# Patient Record
Sex: Female | Born: 1955 | Race: White | Hispanic: No | Smoking: Never smoker
Health system: Southern US, Community
[De-identification: ages and names within clinical notes are randomized; demographics above are authoritative.]

## PROBLEM LIST (undated history)

## (undated) DIAGNOSIS — R0789 Other chest pain: Secondary | ICD-10-CM

## (undated) DIAGNOSIS — K219 Gastro-esophageal reflux disease without esophagitis: Secondary | ICD-10-CM

## (undated) DIAGNOSIS — J189 Pneumonia, unspecified organism: Secondary | ICD-10-CM

## (undated) DIAGNOSIS — S32009K Unspecified fracture of unspecified lumbar vertebra, subsequent encounter for fracture with nonunion: Secondary | ICD-10-CM

## (undated) DIAGNOSIS — G894 Chronic pain syndrome: Secondary | ICD-10-CM

## (undated) DIAGNOSIS — I341 Nonrheumatic mitral (valve) prolapse: Secondary | ICD-10-CM

## (undated) DIAGNOSIS — F32A Depression, unspecified: Secondary | ICD-10-CM

## (undated) DIAGNOSIS — G459 Transient cerebral ischemic attack, unspecified: Secondary | ICD-10-CM

## (undated) DIAGNOSIS — R0602 Shortness of breath: Secondary | ICD-10-CM

## (undated) DIAGNOSIS — I639 Cerebral infarction, unspecified: Secondary | ICD-10-CM

## (undated) DIAGNOSIS — R519 Headache, unspecified: Secondary | ICD-10-CM

## (undated) DIAGNOSIS — R569 Unspecified convulsions: Secondary | ICD-10-CM

## (undated) DIAGNOSIS — S2249XA Multiple fractures of ribs, unspecified side, initial encounter for closed fracture: Secondary | ICD-10-CM

## (undated) DIAGNOSIS — S2239XA Fracture of one rib, unspecified side, initial encounter for closed fracture: Secondary | ICD-10-CM

## (undated) DIAGNOSIS — M5126 Other intervertebral disc displacement, lumbar region: Secondary | ICD-10-CM

## (undated) DIAGNOSIS — F419 Anxiety disorder, unspecified: Secondary | ICD-10-CM

## (undated) DIAGNOSIS — I82409 Acute embolism and thrombosis of unspecified deep veins of unspecified lower extremity: Secondary | ICD-10-CM

## (undated) DIAGNOSIS — E039 Hypothyroidism, unspecified: Secondary | ICD-10-CM

## (undated) DIAGNOSIS — M1990 Unspecified osteoarthritis, unspecified site: Secondary | ICD-10-CM

## (undated) DIAGNOSIS — F329 Major depressive disorder, single episode, unspecified: Secondary | ICD-10-CM

## (undated) DIAGNOSIS — R51 Headache: Secondary | ICD-10-CM

## (undated) HISTORY — PX: ABDOMINAL HYSTERECTOMY: SUR658

## (undated) HISTORY — PX: OTHER SURGICAL HISTORY: SHX169

## (undated) HISTORY — PX: APPENDECTOMY: SHX54

## (undated) HISTORY — DX: Major depressive disorder, single episode, unspecified: F32.9

## (undated) HISTORY — DX: Multiple fractures of ribs, unspecified side, initial encounter for closed fracture: S22.49XA

## (undated) HISTORY — PX: ELBOW SURGERY: SHX618

## (undated) HISTORY — DX: Anxiety disorder, unspecified: F41.9

## (undated) HISTORY — DX: Depression, unspecified: F32.A

## (undated) HISTORY — DX: Nonrheumatic mitral (valve) prolapse: I34.1

## (undated) HISTORY — DX: Fracture of one rib, unspecified side, initial encounter for closed fracture: S22.39XA

## (undated) HISTORY — PX: KNEE SURGERY: SHX244

## (undated) HISTORY — PX: SHOULDER SURGERY: SHX246

## (undated) HISTORY — PX: OOPHORECTOMY: SHX86

## (undated) HISTORY — DX: Acute embolism and thrombosis of unspecified deep veins of unspecified lower extremity: I82.409

## (undated) HISTORY — PX: BREAST BIOPSY: SHX20

## (undated) HISTORY — PX: CERVICAL SPINE SURGERY: SHX589

## (undated) HISTORY — PX: TONSILLECTOMY: SUR1361

## (undated) HISTORY — PX: CHOLECYSTECTOMY: SHX55

---

## 1997-09-04 ENCOUNTER — Inpatient Hospital Stay (HOSPITAL_COMMUNITY): Admission: AD | Admit: 1997-09-04 | Discharge: 1997-09-04 | Payer: Self-pay | Admitting: Pediatrics

## 1997-10-11 ENCOUNTER — Encounter: Admission: RE | Admit: 1997-10-11 | Discharge: 1998-01-09 | Payer: Self-pay | Admitting: Anesthesiology

## 1997-11-04 ENCOUNTER — Inpatient Hospital Stay (HOSPITAL_COMMUNITY): Admission: EM | Admit: 1997-11-04 | Discharge: 1997-11-11 | Payer: Self-pay | Admitting: Emergency Medicine

## 1997-12-02 ENCOUNTER — Emergency Department (HOSPITAL_COMMUNITY): Admission: EM | Admit: 1997-12-02 | Discharge: 1997-12-02 | Payer: Self-pay | Admitting: Emergency Medicine

## 1998-01-14 ENCOUNTER — Inpatient Hospital Stay (HOSPITAL_COMMUNITY): Admission: RE | Admit: 1998-01-14 | Discharge: 1998-01-20 | Payer: Self-pay | Admitting: Specialist

## 1998-01-22 ENCOUNTER — Encounter (HOSPITAL_COMMUNITY): Admission: RE | Admit: 1998-01-22 | Discharge: 1998-04-22 | Payer: Self-pay | Admitting: Specialist

## 1999-05-09 DIAGNOSIS — M5126 Other intervertebral disc displacement, lumbar region: Secondary | ICD-10-CM

## 1999-05-09 HISTORY — DX: Other intervertebral disc displacement, lumbar region: M51.26

## 1999-06-14 ENCOUNTER — Emergency Department (HOSPITAL_COMMUNITY): Admission: EM | Admit: 1999-06-14 | Discharge: 1999-06-14 | Payer: Self-pay | Admitting: Emergency Medicine

## 1999-06-17 ENCOUNTER — Ambulatory Visit (HOSPITAL_COMMUNITY): Admission: RE | Admit: 1999-06-17 | Discharge: 1999-06-17 | Payer: Self-pay | Admitting: *Deleted

## 1999-06-17 ENCOUNTER — Encounter: Payer: Self-pay | Admitting: *Deleted

## 1999-06-27 ENCOUNTER — Encounter: Payer: Self-pay | Admitting: *Deleted

## 1999-06-27 ENCOUNTER — Ambulatory Visit (HOSPITAL_COMMUNITY): Admission: RE | Admit: 1999-06-27 | Discharge: 1999-06-28 | Payer: Self-pay | Admitting: *Deleted

## 1999-10-31 ENCOUNTER — Encounter: Payer: Self-pay | Admitting: Orthopedic Surgery

## 1999-10-31 ENCOUNTER — Ambulatory Visit (HOSPITAL_COMMUNITY): Admission: RE | Admit: 1999-10-31 | Discharge: 1999-10-31 | Payer: Self-pay | Admitting: Orthopedic Surgery

## 2000-01-10 ENCOUNTER — Encounter: Payer: Self-pay | Admitting: *Deleted

## 2000-01-10 ENCOUNTER — Ambulatory Visit (HOSPITAL_COMMUNITY): Admission: RE | Admit: 2000-01-10 | Discharge: 2000-01-10 | Payer: Self-pay | Admitting: *Deleted

## 2000-01-15 ENCOUNTER — Encounter: Payer: Self-pay | Admitting: *Deleted

## 2000-01-15 ENCOUNTER — Ambulatory Visit (HOSPITAL_COMMUNITY): Admission: RE | Admit: 2000-01-15 | Discharge: 2000-01-15 | Payer: Self-pay | Admitting: *Deleted

## 2000-01-16 ENCOUNTER — Encounter: Payer: Self-pay | Admitting: *Deleted

## 2000-01-16 ENCOUNTER — Ambulatory Visit (HOSPITAL_COMMUNITY): Admission: RE | Admit: 2000-01-16 | Discharge: 2000-01-16 | Payer: Self-pay | Admitting: *Deleted

## 2000-02-26 ENCOUNTER — Ambulatory Visit (HOSPITAL_COMMUNITY): Admission: RE | Admit: 2000-02-26 | Discharge: 2000-02-26 | Payer: Self-pay | Admitting: *Deleted

## 2000-02-26 ENCOUNTER — Encounter: Payer: Self-pay | Admitting: *Deleted

## 2001-05-08 HISTORY — PX: OTHER SURGICAL HISTORY: SHX169

## 2001-06-04 ENCOUNTER — Inpatient Hospital Stay (HOSPITAL_COMMUNITY): Admission: EM | Admit: 2001-06-04 | Discharge: 2001-06-06 | Payer: Self-pay | Admitting: Emergency Medicine

## 2001-06-04 ENCOUNTER — Encounter: Payer: Self-pay | Admitting: Emergency Medicine

## 2001-06-18 ENCOUNTER — Encounter: Payer: Self-pay | Admitting: Family Medicine

## 2001-06-18 ENCOUNTER — Encounter: Admission: RE | Admit: 2001-06-18 | Discharge: 2001-06-18 | Payer: Self-pay | Admitting: Family Medicine

## 2001-07-18 ENCOUNTER — Encounter: Payer: Self-pay | Admitting: Gastroenterology

## 2001-07-18 ENCOUNTER — Encounter: Admission: RE | Admit: 2001-07-18 | Discharge: 2001-07-18 | Payer: Self-pay | Admitting: Gastroenterology

## 2001-11-14 ENCOUNTER — Inpatient Hospital Stay (HOSPITAL_COMMUNITY): Admission: EM | Admit: 2001-11-14 | Discharge: 2001-11-16 | Payer: Self-pay | Admitting: Emergency Medicine

## 2001-11-14 ENCOUNTER — Encounter (INDEPENDENT_AMBULATORY_CARE_PROVIDER_SITE_OTHER): Payer: Self-pay | Admitting: Specialist

## 2001-11-14 ENCOUNTER — Encounter: Payer: Self-pay | Admitting: General Surgery

## 2001-11-15 ENCOUNTER — Encounter: Payer: Self-pay | Admitting: General Surgery

## 2001-12-03 ENCOUNTER — Emergency Department (HOSPITAL_COMMUNITY): Admission: EM | Admit: 2001-12-03 | Discharge: 2001-12-03 | Payer: Self-pay | Admitting: Emergency Medicine

## 2001-12-03 ENCOUNTER — Encounter: Payer: Self-pay | Admitting: Emergency Medicine

## 2001-12-19 ENCOUNTER — Encounter: Payer: Self-pay | Admitting: General Surgery

## 2001-12-19 ENCOUNTER — Ambulatory Visit (HOSPITAL_COMMUNITY): Admission: RE | Admit: 2001-12-19 | Discharge: 2001-12-19 | Payer: Self-pay | Admitting: Family Medicine

## 2002-03-14 ENCOUNTER — Ambulatory Visit (HOSPITAL_COMMUNITY): Admission: RE | Admit: 2002-03-14 | Discharge: 2002-03-14 | Payer: Self-pay | Admitting: Family Medicine

## 2002-03-14 ENCOUNTER — Encounter: Payer: Self-pay | Admitting: Family Medicine

## 2002-03-26 ENCOUNTER — Ambulatory Visit (HOSPITAL_COMMUNITY): Admission: RE | Admit: 2002-03-26 | Discharge: 2002-03-26 | Payer: Self-pay | Admitting: Internal Medicine

## 2002-09-05 ENCOUNTER — Emergency Department (HOSPITAL_COMMUNITY): Admission: EM | Admit: 2002-09-05 | Discharge: 2002-09-05 | Payer: Self-pay | Admitting: Emergency Medicine

## 2002-09-16 ENCOUNTER — Encounter: Payer: Self-pay | Admitting: Emergency Medicine

## 2002-09-16 ENCOUNTER — Emergency Department (HOSPITAL_COMMUNITY): Admission: EM | Admit: 2002-09-16 | Discharge: 2002-09-16 | Payer: Self-pay | Admitting: Emergency Medicine

## 2002-12-26 ENCOUNTER — Emergency Department (HOSPITAL_COMMUNITY): Admission: EM | Admit: 2002-12-26 | Discharge: 2002-12-26 | Payer: Self-pay | Admitting: *Deleted

## 2002-12-26 ENCOUNTER — Encounter: Payer: Self-pay | Admitting: Emergency Medicine

## 2003-12-12 ENCOUNTER — Emergency Department (HOSPITAL_COMMUNITY): Admission: EM | Admit: 2003-12-12 | Discharge: 2003-12-12 | Payer: Self-pay | Admitting: Emergency Medicine

## 2003-12-14 ENCOUNTER — Emergency Department (HOSPITAL_COMMUNITY): Admission: EM | Admit: 2003-12-14 | Discharge: 2003-12-14 | Payer: Self-pay | Admitting: Family Medicine

## 2003-12-19 ENCOUNTER — Emergency Department (HOSPITAL_COMMUNITY): Admission: EM | Admit: 2003-12-19 | Discharge: 2003-12-19 | Payer: Self-pay | Admitting: Emergency Medicine

## 2004-01-26 ENCOUNTER — Encounter: Admission: RE | Admit: 2004-01-26 | Discharge: 2004-01-26 | Payer: Self-pay | Admitting: Orthopaedic Surgery

## 2004-05-04 ENCOUNTER — Ambulatory Visit (HOSPITAL_COMMUNITY): Admission: RE | Admit: 2004-05-04 | Discharge: 2004-05-04 | Payer: Self-pay | Admitting: Family Medicine

## 2004-12-12 ENCOUNTER — Ambulatory Visit (HOSPITAL_COMMUNITY): Admission: RE | Admit: 2004-12-12 | Discharge: 2004-12-12 | Payer: Self-pay | Admitting: Family Medicine

## 2005-01-17 ENCOUNTER — Encounter: Admission: RE | Admit: 2005-01-17 | Discharge: 2005-01-17 | Payer: Self-pay | Admitting: *Deleted

## 2005-01-20 ENCOUNTER — Emergency Department (HOSPITAL_COMMUNITY): Admission: EM | Admit: 2005-01-20 | Discharge: 2005-01-21 | Payer: Self-pay | Admitting: Emergency Medicine

## 2005-04-22 ENCOUNTER — Emergency Department (HOSPITAL_COMMUNITY): Admission: EM | Admit: 2005-04-22 | Discharge: 2005-04-22 | Payer: Self-pay | Admitting: Emergency Medicine

## 2005-08-09 ENCOUNTER — Ambulatory Visit (HOSPITAL_COMMUNITY): Admission: RE | Admit: 2005-08-09 | Discharge: 2005-08-09 | Payer: Self-pay | Admitting: Family Medicine

## 2005-09-10 ENCOUNTER — Inpatient Hospital Stay (HOSPITAL_COMMUNITY): Admission: EM | Admit: 2005-09-10 | Discharge: 2005-09-10 | Payer: Self-pay | Admitting: Emergency Medicine

## 2005-09-10 ENCOUNTER — Inpatient Hospital Stay (HOSPITAL_COMMUNITY): Admission: EM | Admit: 2005-09-10 | Discharge: 2005-09-12 | Payer: Self-pay | Admitting: Psychiatry

## 2005-09-11 ENCOUNTER — Ambulatory Visit: Payer: Self-pay | Admitting: Psychiatry

## 2006-01-03 ENCOUNTER — Ambulatory Visit (HOSPITAL_COMMUNITY): Admission: RE | Admit: 2006-01-03 | Discharge: 2006-01-03 | Payer: Self-pay | Admitting: Family Medicine

## 2006-01-06 ENCOUNTER — Emergency Department (HOSPITAL_COMMUNITY): Admission: EM | Admit: 2006-01-06 | Discharge: 2006-01-07 | Payer: Self-pay | Admitting: Emergency Medicine

## 2006-04-19 ENCOUNTER — Inpatient Hospital Stay (HOSPITAL_COMMUNITY): Admission: AD | Admit: 2006-04-19 | Discharge: 2006-04-20 | Payer: Self-pay | Admitting: Cardiovascular Disease

## 2007-03-22 ENCOUNTER — Encounter (INDEPENDENT_AMBULATORY_CARE_PROVIDER_SITE_OTHER): Payer: Self-pay | Admitting: Family Medicine

## 2007-03-25 ENCOUNTER — Ambulatory Visit: Payer: Self-pay | Admitting: Family Medicine

## 2007-03-25 DIAGNOSIS — R413 Other amnesia: Secondary | ICD-10-CM | POA: Insufficient documentation

## 2007-03-25 DIAGNOSIS — M545 Low back pain, unspecified: Secondary | ICD-10-CM | POA: Insufficient documentation

## 2007-03-25 DIAGNOSIS — E669 Obesity, unspecified: Secondary | ICD-10-CM | POA: Insufficient documentation

## 2007-03-25 DIAGNOSIS — G40909 Epilepsy, unspecified, not intractable, without status epilepticus: Secondary | ICD-10-CM | POA: Insufficient documentation

## 2007-03-25 DIAGNOSIS — J42 Unspecified chronic bronchitis: Secondary | ICD-10-CM | POA: Insufficient documentation

## 2007-03-25 DIAGNOSIS — J309 Allergic rhinitis, unspecified: Secondary | ICD-10-CM | POA: Insufficient documentation

## 2007-03-25 DIAGNOSIS — M129 Arthropathy, unspecified: Secondary | ICD-10-CM | POA: Insufficient documentation

## 2007-03-25 DIAGNOSIS — F411 Generalized anxiety disorder: Secondary | ICD-10-CM | POA: Insufficient documentation

## 2007-03-25 DIAGNOSIS — IMO0001 Reserved for inherently not codable concepts without codable children: Secondary | ICD-10-CM | POA: Insufficient documentation

## 2007-03-25 DIAGNOSIS — G894 Chronic pain syndrome: Secondary | ICD-10-CM | POA: Insufficient documentation

## 2007-03-25 DIAGNOSIS — J45909 Unspecified asthma, uncomplicated: Secondary | ICD-10-CM | POA: Insufficient documentation

## 2007-03-25 DIAGNOSIS — K279 Peptic ulcer, site unspecified, unspecified as acute or chronic, without hemorrhage or perforation: Secondary | ICD-10-CM | POA: Insufficient documentation

## 2007-03-25 DIAGNOSIS — I499 Cardiac arrhythmia, unspecified: Secondary | ICD-10-CM | POA: Insufficient documentation

## 2007-03-25 DIAGNOSIS — K589 Irritable bowel syndrome without diarrhea: Secondary | ICD-10-CM | POA: Insufficient documentation

## 2007-03-25 DIAGNOSIS — G589 Mononeuropathy, unspecified: Secondary | ICD-10-CM | POA: Insufficient documentation

## 2007-03-25 DIAGNOSIS — G43909 Migraine, unspecified, not intractable, without status migrainosus: Secondary | ICD-10-CM | POA: Insufficient documentation

## 2007-03-25 DIAGNOSIS — K219 Gastro-esophageal reflux disease without esophagitis: Secondary | ICD-10-CM | POA: Insufficient documentation

## 2007-03-25 DIAGNOSIS — R109 Unspecified abdominal pain: Secondary | ICD-10-CM | POA: Insufficient documentation

## 2007-03-25 DIAGNOSIS — F329 Major depressive disorder, single episode, unspecified: Secondary | ICD-10-CM

## 2007-03-25 DIAGNOSIS — G5600 Carpal tunnel syndrome, unspecified upper limb: Secondary | ICD-10-CM | POA: Insufficient documentation

## 2007-03-25 DIAGNOSIS — F3289 Other specified depressive episodes: Secondary | ICD-10-CM | POA: Insufficient documentation

## 2007-03-26 ENCOUNTER — Encounter (INDEPENDENT_AMBULATORY_CARE_PROVIDER_SITE_OTHER): Payer: Self-pay | Admitting: Family Medicine

## 2007-03-26 ENCOUNTER — Telehealth (INDEPENDENT_AMBULATORY_CARE_PROVIDER_SITE_OTHER): Payer: Self-pay | Admitting: *Deleted

## 2007-03-26 LAB — CONVERTED CEMR LAB
Albumin: 4.1 g/dL (ref 3.5–5.2)
BUN: 17 mg/dL (ref 6–23)
Calcium: 8.8 mg/dL (ref 8.4–10.5)
Chloride: 108 meq/L (ref 96–112)
Glucose, Bld: 89 mg/dL (ref 70–99)
HDL: 44 mg/dL (ref >39)
Lymphs Abs: 2.5 10*3/uL (ref 0.7–4.0)
MCV: 90.2 fL (ref 78.0–100.0)
Monocytes Relative: 10 % (ref 3–12)
Neutro Abs: 2.9 10*3/uL (ref 1.7–7.7)
Neutrophils Relative %: 47 % (ref 43–77)
Potassium: 4 meq/L (ref 3.5–5.3)
RBC: 4.4 M/uL (ref 3.87–5.11)
Triglycerides: 102 mg/dL (ref <150)
WBC: 6.2 10*3/uL (ref 4.0–10.5)

## 2007-03-28 ENCOUNTER — Telehealth (INDEPENDENT_AMBULATORY_CARE_PROVIDER_SITE_OTHER): Payer: Self-pay | Admitting: Family Medicine

## 2007-04-01 ENCOUNTER — Encounter (INDEPENDENT_AMBULATORY_CARE_PROVIDER_SITE_OTHER): Payer: Self-pay | Admitting: Family Medicine

## 2007-04-02 ENCOUNTER — Telehealth (INDEPENDENT_AMBULATORY_CARE_PROVIDER_SITE_OTHER): Payer: Self-pay | Admitting: *Deleted

## 2007-04-02 ENCOUNTER — Encounter (INDEPENDENT_AMBULATORY_CARE_PROVIDER_SITE_OTHER): Payer: Self-pay | Admitting: Family Medicine

## 2007-04-08 ENCOUNTER — Encounter (INDEPENDENT_AMBULATORY_CARE_PROVIDER_SITE_OTHER): Payer: Self-pay | Admitting: Family Medicine

## 2007-04-08 ENCOUNTER — Telehealth (INDEPENDENT_AMBULATORY_CARE_PROVIDER_SITE_OTHER): Payer: Self-pay | Admitting: Family Medicine

## 2007-04-09 ENCOUNTER — Ambulatory Visit (HOSPITAL_COMMUNITY): Admission: RE | Admit: 2007-04-09 | Discharge: 2007-04-09 | Payer: Self-pay | Admitting: Family Medicine

## 2007-04-11 ENCOUNTER — Telehealth (INDEPENDENT_AMBULATORY_CARE_PROVIDER_SITE_OTHER): Payer: Self-pay | Admitting: Family Medicine

## 2007-04-22 ENCOUNTER — Telehealth (INDEPENDENT_AMBULATORY_CARE_PROVIDER_SITE_OTHER): Payer: Self-pay | Admitting: *Deleted

## 2007-04-22 ENCOUNTER — Ambulatory Visit: Payer: Self-pay | Admitting: Family Medicine

## 2007-04-23 ENCOUNTER — Encounter (INDEPENDENT_AMBULATORY_CARE_PROVIDER_SITE_OTHER): Payer: Self-pay | Admitting: Family Medicine

## 2007-05-07 ENCOUNTER — Ambulatory Visit: Payer: Self-pay | Admitting: Family Medicine

## 2007-05-07 DIAGNOSIS — J00 Acute nasopharyngitis [common cold]: Secondary | ICD-10-CM | POA: Insufficient documentation

## 2007-05-07 DIAGNOSIS — B9789 Other viral agents as the cause of diseases classified elsewhere: Secondary | ICD-10-CM | POA: Insufficient documentation

## 2007-05-07 LAB — CONVERTED CEMR LAB
Inflenza A Ag: NEGATIVE
Rapid Strep: NEGATIVE

## 2007-05-20 ENCOUNTER — Telehealth (INDEPENDENT_AMBULATORY_CARE_PROVIDER_SITE_OTHER): Payer: Self-pay | Admitting: *Deleted

## 2007-05-21 ENCOUNTER — Telehealth (INDEPENDENT_AMBULATORY_CARE_PROVIDER_SITE_OTHER): Payer: Self-pay | Admitting: Family Medicine

## 2007-05-22 ENCOUNTER — Ambulatory Visit: Payer: Self-pay | Admitting: Family Medicine

## 2007-05-22 DIAGNOSIS — S91309A Unspecified open wound, unspecified foot, initial encounter: Secondary | ICD-10-CM | POA: Insufficient documentation

## 2007-06-07 ENCOUNTER — Emergency Department (HOSPITAL_COMMUNITY): Admission: EM | Admit: 2007-06-07 | Discharge: 2007-06-08 | Payer: Self-pay | Admitting: Emergency Medicine

## 2007-06-28 ENCOUNTER — Ambulatory Visit (HOSPITAL_COMMUNITY): Admission: RE | Admit: 2007-06-28 | Discharge: 2007-06-28 | Payer: Self-pay | Admitting: Family Medicine

## 2007-11-28 ENCOUNTER — Ambulatory Visit (HOSPITAL_BASED_OUTPATIENT_CLINIC_OR_DEPARTMENT_OTHER): Admission: RE | Admit: 2007-11-28 | Discharge: 2007-11-28 | Payer: Self-pay | Admitting: Orthopedic Surgery

## 2008-01-17 ENCOUNTER — Ambulatory Visit (HOSPITAL_COMMUNITY): Admission: RE | Admit: 2008-01-17 | Discharge: 2008-01-17 | Payer: Self-pay | Admitting: Pulmonary Disease

## 2008-03-25 ENCOUNTER — Encounter: Admission: RE | Admit: 2008-03-25 | Discharge: 2008-03-25 | Payer: Self-pay | Admitting: Specialist

## 2008-10-11 ENCOUNTER — Emergency Department (HOSPITAL_COMMUNITY): Admission: EM | Admit: 2008-10-11 | Discharge: 2008-10-11 | Payer: Self-pay | Admitting: Emergency Medicine

## 2008-11-24 ENCOUNTER — Ambulatory Visit (HOSPITAL_COMMUNITY): Admission: RE | Admit: 2008-11-24 | Discharge: 2008-11-24 | Payer: Self-pay | Admitting: Family Medicine

## 2009-07-27 ENCOUNTER — Ambulatory Visit (HOSPITAL_COMMUNITY): Admission: RE | Admit: 2009-07-27 | Discharge: 2009-07-27 | Payer: Self-pay | Admitting: Family Medicine

## 2009-09-24 ENCOUNTER — Emergency Department (HOSPITAL_COMMUNITY): Admission: EM | Admit: 2009-09-24 | Discharge: 2009-09-24 | Payer: Self-pay | Admitting: Emergency Medicine

## 2010-05-29 ENCOUNTER — Encounter: Payer: Self-pay | Admitting: Family Medicine

## 2010-05-29 ENCOUNTER — Encounter: Payer: Self-pay | Admitting: *Deleted

## 2010-08-15 LAB — URINALYSIS, ROUTINE W REFLEX MICROSCOPIC
Bilirubin Urine: NEGATIVE
Nitrite: NEGATIVE
Specific Gravity, Urine: 1.01 (ref 1.005–1.030)
Urobilinogen, UA: 0.2 mg/dL (ref 0.0–1.0)

## 2010-08-15 LAB — URINE MICROSCOPIC-ADD ON
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
## 2010-09-23 NOTE — Discharge Summary (Signed)
[place]  Patient:    [name], [name]                     MRN: [id number] Adm. Date:  [id number] Disc. Date: [id number] Attending:  Evonnie Dawes                           Discharge Summary  ADMISSION DIAGNOSIS:  Herniated nucleus pulposus, right L5-S1.  PREVIOUS SIGNIFICANT HISTORY:  Herniated nucleus pulposus, L5-S1 x 2, left- sided.  OPERATION:  Right L4-5 diskectomy and decompression of the right L5 nerve root, closure of scar tissue/dural laceration, superior and inferior dorsal, and inferior to the L5 nerve root axilla on the right.  DISCHARGE VITAL SIGNS:  Stable.  The wound looked clear.  Dressing was soft. No subcutaneous fluid collection.  Right-sided leg pain improved.  The patient discharged home.  DISCHARGE MEDICATIONS:  Prescription for Mepergan Forte for pain.  FOLLOWUP:  I will see her again in one weeks time in followup.  CONDITION ON DISCHARGE:  Improved. DD:  01/17/00 TD:  01/18/00 Job: [id number]

## 2010-09-23 NOTE — Op Note (Signed)
[place]  Patient:    [name], [name]                     MRN: [id number] Proc. Date: 01/16/00 Adm. Date:  [id number] Attending:  Evonnie Dawes                           Operative Report  PREOPERATIVE DIAGNOSIS:  Right-sided herniated nucleus pulposus, L4-5, with sequestration.  POSTOPERATIVE DIAGNOSIS:  Right-sided herniated nucleus pulposus, L4-5, with sequestration; mass of epidural adhesions from the previous left-sided surgery.  PROCEDURE: 1. Right-sided L4-5 diskectomy, microsurgical. 2. Repair of complex dural tear.  SURGEON:  Ricky D. Gasper Sells, M.D.  ASSISTANT:  Annie Sable, Montez Hageman., M.D.  ANESTHESIA:  General.  CONTROL:  Instrument count, sponge count, needle count correct.  ESTIMATED BLOOD LOSS:  Less than 50 cc.  SUMMARY:  This is a 55 year old female on whom I did a redo lumbar diskectomy in the spring of 2001.  This was on the left side.  It was at the L4-5 level. She then developed right-sided leg pain with a mixed L5 and S1 radiculopathy. I did an MRI as well as myelogram, CT scan, and she had a new disk on the right at L4-5 compressing the L5 nerve root.  Clinically, she had numbness and tingling on the outside of her right foot and on the dorsum of her right foot with some minimal dorsiflexion weakness.  I could not make my mind up whether it was more S1 or more L5, but a left crossed leg/straight leg raise indicated the disk fragment was in the axilla of the nerve root which could catch both L5 and S1.  Similarly, she had decreased sensation lateral aspect, dorsum of her right foot, consistent with a mixed radiculopathy.  Intraoperatively, I ran into massive epidural adhesions from her previous surgery on the left side which had snuck over to the right side.  The ligamentum flavum was badly scarred down to the dura.  In the axilla of the L5 nerve root, dural laceration was made up and over the nerve  root.  This was closed with interrupted 6-0 silk sutures, and then overlaid with Surgicel, and TISSELL tissue glue was placed over top of that to hold it in place.  Prior to the [facility], however, there was no CSF leaked to a 35+ cm water Valsalva. There was very little CSF leakage anyway.  This was just an adhesion that, when it was pulled on, tore the dura.  There did not appear to be an significant nerve root damage at that time.  The patient tolerated that procedure well.  The diskectomy itself was unremarkable, and she left for the recovery room in good condition.  DESCRIPTION OF PROCEDURE:  With the patient in the prone position, the patient was prepped and draped in the usual fashion for a lumbar laminectomy.  The previous laminectomy incision was re-excised, and the subcutaneous fat was then coagulated where necessary for hemostasis.  It was divided down to the spinous processes of L5 and L4.  A control x-ray was done to localize the L4-5 level.  Actually, it was done x 2.  Paraspinal muscles and ligaments were dissected free of the spinous processes and laminas of L4 and L5, and partially L3.  A high speed drill was used to perform an inferior L5 laminotomy on the right as well as a medial fasciotomy of L4-5 on  the right.  Using the Kerrison punch in the region dorsal to the L5 nerve root inferiorly removing the rim of L5, there was a gush of CSF.  Dissection in that area was truncated, and a wider dural exposure was made taking part of the spinous process of L4 and L5 along the way.  She was not particularly unstable, so it was decided not to fuse her at this sitting.  The disk was carefully dissected out, incised with a #15 blade knife and evacuated with up-biting, straight-biting, down-biting pituitary rongeurs using a nerve root retractor to hold the dura away.  It was then curetted with an Epstein instrument because the ring curette would not fit into the disk.  The wound  was then carefully inspected for where the CSF leak was.  It appeared to be above and into the axilla of the L5 nerve root on the right. This dural edge was carefully reapproximated; 6-0 silk was used.  Inferiorly, a piece of Surgicel was inserted into the ______ and held in place with one 6-0 silk suture.  A Valsalva of 35+-cm of water was performed.  There was no CSF leak. Nevertheless, because of the ratty appearance of the dura from the previous epidural scarring, it was decided to put a layer of TISSELL over the sutures, particularly gauging how difficult to close an axillary tear.  A total of 100 mcg of Fentanyl was then left on the exposed dura, having controlled epidural hemostasis and paraspinal hemostasis beforehand with unipolar coagulation.  The dorsal lumbar fascia was then closed with 2-0 Vicryl in an interrupted fashion.  The subcutaneous closure was carried out with 2-0 Vicryl inverted interrupted.  Skin staples were used in the skin.  The patient tolerated the procedure well.  A Betadine ointment and dry dressing was fashioned, and the patient was slid onto her gurney bed and sent to the neuro PACU. DD:  01/16/00 TD:  01/16/00 Job: [id number]
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
## 2010-09-23 NOTE — Cardiovascular Report (Signed)
[name], BOGGESS NO.:  [id number]   MEDICAL RECORD NO.:  [id number]          PATIENT TYPE:  INP   LOCATION:  2008                         FACILITY:  [facility]   PHYSICIAN:  Nanetta Batty, M.D.   DATE OF BIRTH:  03-22-1956   DATE OF PROCEDURE:  DATE OF DISCHARGE:                            CARDIAC CATHETERIZATION   [name] is a 55 year old married white female with history of normal  cath Sep 17, 2003.  She has had a DVT in the past, positive for mitral  prolapse, GERD, anxiety and depression.  She was seen in the office  yesterday complaining of chest pain.  She was transferred by EMS to [facility]  where she ruled out for myocardial infarction.  She was treated with  Lovenox and presents now for diagnostic coronary arteriography to rule  out an ischemic etiology.   DESCRIPTION OF PROCEDURE:  The patient was brought to the __________ at  [facility] cardiac catheterization lab in a postabsorptive state.  She  was premedicated with p.o. prednisone, IV Solu-Medrol, Pepcid and  Benadryl.  She also has a latex allergy and Biogel gloves, latex free,  were used.  The case was prepped in the usual sterile fashion.  1%  Xylocaine was used for local anesthesia.  A 6-French sheath was inserted  into the right femoral artery using standard Seldinger technique.  A 6-  French left Judkins diagnostic catheter as well as [place] pigtail  catheter were used for selective coronary angiography, left  ventriculography and supravalvular aortography to rule out aortic  dissection.  Visipaque dye was used for the entirety of the case.  __________ ventricular and __________ blood pressures were recorded.   HEMODYNAMIC RESULTS:  1. Aortic systolic pressure 114.  Diastolic pressure 72.  Left      ventricular systolic pressure 109 and end-diastolic pressure 12.   SELECTIVE CORONARY ANGIOGRAPHY:  1. Left main normal.  2. LAD normal.  3. Left circumflex was dominant normal.  4. Right  coronary is nondominant normal.  5. Left ventriculography; RAO left ventriculogram was performed using      25 cc of Visipaque dye at 12 cc per second.  The overall LV-EF      displayed greater than 60% without focal wall motion abnormalities.  6. Distal abdominal aortography; supravalvular aortography was      performed using 20 cc of Visipaque dye at 20 cc per second.  The      ascending aorta was normal in caliber.  There was no dissection      noted.  Arch vessels were intact.   IMPRESSION:  [name] has essentially normal coronary arteries with  normal left ventricular function.  I believe her chest pain is  noncardiac.  She will be treated empirically with antireflux measures,  and discharged home later today.  She will see me back in the office in  1-2 weeks for follow-up.   The sheath was removed and pressure was held in the groin to achieve  hemostasis.  The patient left the lab in stable condition.      Nanetta Batty, M.D.  Electronically Signed     JB/MEDQ  D:  04/20/2006  T:  04/20/2006  Job:  [id number]   cc:   [facility]
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
## 2010-09-23 NOTE — Discharge Summary (Signed)
[name], [name]              ACCOUNT NO.:  [id number]   MEDICAL RECORD NO.:  [id number]          PATIENT TYPE:  INP   LOCATION:  5010                         FACILITY:  [facility]   PHYSICIAN:  Michaelyn Barter, M.D. DATE OF BIRTH:  1956-03-16   DATE OF ADMISSION:  09/10/2005  DATE OF DISCHARGE:  09/10/2005                                 DISCHARGE SUMMARY   PRIMARY CARE PHYSICIAN:  Unassigned.  She sees Dr. John Giovanni,  .   FINAL DIAGNOSES:  1.  Severe major depression.  2.  Suicide attempt via drug overdose.  3.  Hypotension.  4.  Hypokalemia.   CONSULTATIONS:  Psychiatry, Jasmine Pang, M.D.   HISTORY OF PRESENT ILLNESS:  [name] is a 55 year old female who was  very sleepy when she initially arrived into the hospital.  Her husband, Mr.  Cyera Balboni, and her friend, Ms. Mayer Camel, provide the history.  According to the patient's husband, the patient took a combination of pills.  He cannot remember the name.  The patient had an argument with her son prior  to taking the overdose medication.  She became upset.  Her friend, Mayer Camel, stated that the patient called her at approximately 10 o'clock p.m.  Her speech sounded different as she was slurring her words.  The patient  indicated that she had written a suicide letter.  She appeared to be very  upset and depressed and admitted that she had taken a drug overdose of not  only her medication but several of her son's medications which included  Ambien.  While in the ER, the patient indicated that she did not want to go  on living anymore and indicated several times that she wanted to die.   PAST MEDICAL HISTORY:  Please see that dictated by Dr. Michaelyn Barter on  Sep 10, 2005.   HOSPITAL COURSE:  #1.  SUICIDE ATTEMPT VIA DRUG OVERDOSE:  The patient was extremely lethargic  and sleepy when she arrived in the emergency department.  She had a blood  pressure of 83/48, heart rate of 71,  respirations 12.  Arterial blood gas  was completed and had a pH of 7.315, pCO2 of 49.3, bicarb of 25.1.  Urine  drug screen revealed benzodiazepines had been in her system.  She was  supported symptomatically.  Here lethargy and sleepiness resolved.   #2.  SEVERE MAJOR DEPRESSION:  Psychiatry was consulted, and they indicated  that the patient had major depression which is severe.  They also indicated  that the patient had numerous medical problems which put her into a higher  risk category for suicide.  They recommended the patient be hospitalized on  a psychiatric unit.  They also stated that the patient could be transferred  to [facility].   #3.  HYPOTENSION:  The patient received aggressive IV fluid hydration to  support blood pressure.   #4.  HYPOKALEMIA:  This was replaced with KCl 10 mEq IV piggyback x3.   Following the advice of psychiatry, the patient was discharged to [facility] later on the  evening of Sep 10, 2005.      Michaelyn Barter, M.D.  Electronically Signed     OR/MEDQ  D:  10/23/2005  T:  10/23/2005  Job:  [id number]   cc:   Mila Homer. Sudie Bailey, M.D.  Fax: [phone]

## 2010-09-23 NOTE — Op Note (Signed)
NAME:  [name], [name]                        ACCOUNT NO.:  [id number]   MEDICAL RECORD NO.:  [id number]                   PATIENT TYPE:  AMB   LOCATION:  DAY                                  FACILITY:  [facility]   PHYSICIAN:  Lionel December, M.D.                 DATE OF BIRTH:  30-Jun-1955   DATE OF PROCEDURE:  03/26/2002  DATE OF DISCHARGE:                                 OPERATIVE REPORT   PROCEDURE:  Total colonoscopy with terminal ileoscopy.   INDICATIONS:  The patient is a 55 year old Caucasian female with recurrent  lower abdominal pain and intermittent hematochezia.  She is undergoing a  diagnostic colonoscopy.  She tells me that she had a colonoscopy years ago  and was advised to come back, but she never did; however, she does not  remember the findings.  These records are not available, as the procedure  was done out of town.  Furthermore, the patient requested colonoscopy to be  performed under general anesthesia.  I talked with Johnney Killian, M.D.,  earlier, and this has been arranged.  Informed consent for the procedure was  obtained from the patient.   PREMEDICATION:  Procedure performed under endotracheal general anesthesia in  the OR.   INSTRUMENT USED:  Olympus video system.   FINDINGS:  After the patient was placed under anesthesia, she was turned and  placed in the left lateral recumbent position.  Rectal examination  performed.  This was within normal limits.  The scope was placed in the  rectum and advanced under vision to the sigmoid colon and beyond.  Preparation was excellent.  The scope was passed in the cecum, which was  identified by the ileocecal valve.  There were four small diverticula  involved in the ________ the cecum.  A picture was taken for the record.  The scope was advanced to the TI, which was examined for eight to 10 inches  and was normal.  Once again a picture was taken for the record.  As the  scope was withdrawn, colonic mucosa was once  again carefully examined and  was normal throughout.  The rectal mucosa was normal.  The scope was  retroflexed to examine the anorectal junction, and hemorrhoids were noted  below the dentate line.  The endoscope was straightened and withdrawn.  The  patient tolerated the procedure well.  The patient was extubated and  transferred to the PACU in stable condition. The patient tolerated the  procedure well.   FINAL DIAGNOSES:  1. Few small cecal diverticula and external hemorrhoids, otherwise normal     colonoscopy.  2. Normal terminal ileoscopy.   Suspect we are dealing with irritable bowel syndrome but it is also the  neuropathic pain, given her complicated history.    RECOMMENDATIONS:  She should start a high-fiber diet and Benefiber 4 g q.d.  If her symptoms change, she will return for a re-evaluation.  Lionel December, M.D.    NR/MEDQ  D:  03/26/2002  T:  03/26/2002  Job:  [id number]   cc:   Elvina Sidle, M.D.  [address] [place]  [place] [address]  Fax: [phone]
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
## 2010-09-23 NOTE — Op Note (Signed)
[place]  Patient:    [name], [name]                     MRN: [id number] Proc. Date: 06/27/99 Adm. Date:  [id number] Attending:  Evonnie Dawes                           Operative Report  PROCEDURE:  Left L4-5 microdiskectomy and decompression of the L4 and L5 nerve roots, lysis of epidural adhesions.  Microdiskectomy redo operation.  SURGEON:  Ricky D. Gasper Sells, M.D.  ASSISTANT:  Annie Sable, Montez Hageman., M.D.  ESTIMATED BLOOD LOSS:  Less than 100 cc.  COMPLICATIONS:  Nil.  INDICATIONS:  This is a 55 year old female who had had a remote L4-5 diskectomy in the past.  She had a fit of coughing or sneezing recently and had the sudden onset of low back pain and left leg pain.  An MRI showed a rerupture of the L4-5 level with superior extension of the fragment.  There was a smaller midline rupture at L4-5.  When the findings of the operation were clear, that is a clear rupture of multiple fragments at L4-5 into scar tissue without further extension involving  both the L5 and L4 nerve roots, it was not found necessary to explore the L3-4. At the time of closure, the epidural space was clear.  The major fragments, actually the largest ones were underneath the L5 nerve root.  The smaller fragments having herniated superiorly.  The L4 and L5 nerve roots were clear at the time of closure.  There was no evidence of CSF leak and no complications occurred.  DESCRIPTION OF PROCEDURE:  With the patient in the prone position, the patient as prepped and draped in the usual sterile fashion for a lumbar laminectomy. Having carefully inspected and padded all pressure points, I placed the PAS hose on and the Bearhugger blanket in place.  A midline incision was made and staying in the relatively avascular midline, the L4-5 level was clearly identified and confirmed with intraoperative control x-ray.  The L4-5 disk space was more inferior  as one would expect from the interlaminar space and superior L5 laminotomy was carried out until the inferior part of the L4-5 disk could easily be visualized.  X-ray control was used.  There was a lot of scar tissue and disk material in between the L4 and L5 nerve roots.  This was removed.  The disk was then identified, entered, and evacuated with upbiting, straightbiting, and downbiting pituitary rongeurs, curetted with the medium bent ring curet, and that was repeated, and then curetted with Epstein instruments.  A nervehook was used to feel into the L5 nerve root nd in the axilla of L5 a number of free fragments were removed.  It was clear that  this rupture had involved not only the L5 nerve root, but the L4 nerve root on he left.  Both of these were free at the time of closure.  Generous L5 foraminotomy had been performed.  Epidural hemostasis was achieved with Gelfoam.  100 mcg of  fentanyl was left in the Gelfoam for 120 seconds.  Self-retaining retractor was  removed.  Paraspinal hemostasis was achieved with unipolar coagulation.  Dorsal  lumbar fascia was then closed with 2-0 Vicryl in interrupted fashion. Subcutaneous closure was carried out with 2-0 Vicryl inverted and interrupted.  Hydrogen peroxide was then used to irrigate the paraspinal subcutaneous space  since she as allergic to Betadine.  Having closed the subcutaneous layer with inverted interrupted 2-0 Vicryl sutures, the skin was closed with skin staples. Betadine ointment and dry dressing was applied.  The patient tolerated the procedure well and awoke from surgery unremarkably. DD:  06/27/99 TD:  06/28/99 Job: [id number]
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
## 2010-09-23 NOTE — Discharge Summary (Signed)
[place]. [facility]  Patient:    [name], [name]                     MRN: [id number] Adm. Date:  [id number] Disc. 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  PROCEDURES:  Left microsurgical exploration L4-5 and microdiskectomy with lysis of epidural adhesions and removal of free fragment and decompression of the L4 and L5 nerve roots.  SURGEON:  Ricky D. Gasper Sells, M.D.  ASSISTANT:  Annie Sable, Montez Hageman., M.D.  ANESTHESIA:  General controlled.  INSTRUMENT, SPONGE, NEEDLE COUNTS:  Correct.  COMPLICATIONS:  Nil.  HISTORY OF PRESENT ILLNESS:  This is a 55 year old female who has had a remote 4-5 lumbar laminectomy.  She was well until recently, when she had a cold and began to cough and sneeze and noticed sudden onset of low back pain and left leg pain. he pain radiated down her left leg, and she had both an L4 and L5 radiculopathy. he had a small disk on MRI at the L3-4 level and large recurrent disk with superior migration at the L4-5 level on the left.  Initially, I was going to explore both levels, but intraoperatively I found that the L4-5 rupture had compressed both he L4 and L5 nerve roots with a superior herniation and multiple fragments.  At the time of closure, both the L4 and the L5 nerve roots are clear.  HOSPITAL COURSE:  Immediately postoperatively, the patient had improved mobility and less left leg s. On the first postoperative day, she spiked a fever which responded to Tylenol and incentive spirometry and ambulation.  She had walked 300+ feet on the first few hours following admission, and walked the same distance the next day.  She had some trouble voiding because of increased back pain with straining.  I told her this was  probably normal.  We had to catheterize her twice, but it turns out she was probably voiding pretty normally in that area, with only some guarding of her incision.  At the time of discharge, she was walking down he hallway.  She had minimal left leg pain.  She was voiding on her own without catheterization assistance.  The pain she experienced with voiding was back pain.  Overall, she looked pretty good.  Her incision was clear.  Her temperature was 99 degrees, I took it myself, and that was at 6:35 p.m. on the day of discharge.  CONDITION ON DISCHARGE:  Markedly improved.  DISPOSITION:  Home.  FOLLOW-UP:  In my office in eight days time. DD:  06/28/99 TD:  06/29/99 Job: [id number]
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
## 2010-09-23 NOTE — Discharge Summary (Signed)
[name], Savage NO.:  [id number]   MEDICAL RECORD NO.:  [id number]          PATIENT TYPE:  INP   LOCATION:  2008                         FACILITY:  [facility]   PHYSICIAN:  Nanetta Batty, M.D.   DATE OF BIRTH:  05-Nov-1955   DATE OF ADMISSION:  04/19/2006  DATE OF DISCHARGE:  04/20/2006                               DISCHARGE SUMMARY   HISTORY OF PRESENT ILLNESS:  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  She  underwent coronary arteriography at [facility] on Sep 17, 2003, which was entirely normal.  Over the last several days she had a  recurrent chest pain.  She was referred to see Dr. Nanetta Batty by Dr.  Glennie Isle in our office.  She came in, she was having severe chest pain and  shortness of breath, radiating into her left upper extremity, and EMS  was called, and she was taken to the hospital.  She has multiple  allergies.  She was put on Lovenox and set up for cardiac  catheterization on April 20, 2006.  She underwent cardiac  catheterization by Dr. Nanetta Batty.  She had normal coronary  arteries, left dominant, normal LV function, and normal aortic root.  He  thought her pain was non-cardiac.  He recommended treating her  empirically with reflux medicine and to go back and see her primary care  doctor, thus she was discharged home after her allotted time for bedrest  was up, and she walked in the halls without any symptoms.  Post sheath-  pull, she did have an episode where she became nauseated.  She was given  Zofran and Phenergan and fluids and it resolved.   DISCHARGE MEDICATIONS:  Discharge medications are the same medications  prior to her admission.  1. Neurontin 900 mg 2 times per day.  2. Claritin 10 mg 1 time per day.  3. Hydroxyzine 100 mg at bedtime.  4. Neurontin 1800 mg at bedtime.  5.  Fluoxetine 60 mg 1 every day.  6. Meclizine 25 mg 2 times per day.  7. AcipHex 2 mg 1 time per day.  8. Lorazepam 1 mg as needed.   DISCHARGE INSTRUCTIONS:  She should drink plenty of fluids tonight and  tomorrow.  She has an appointment to follow up with Dr. Allyson Sabal to just  check her groin, May 03, 2006, and she should follow up with Dr.  Glennie Isle.   LABORATORY DATA:  Hemoglobin 12.9, hematocrit 38.3, WBC 7.1, platelets  315,000.  CK-MB and troponins were negative x2.  Total cholesterol 207,  triglycerides 132, HDL 43, LDL 138.  TSH 2.476.   DISCHARGE DIAGNOSES:  1. Chest pain of unknown etiology, not cardiac ischemia, status-post      cardiac cath with normal cardiac catheterization.  2. Palpitations.  3. Hyperlipidemia.  4. History of depression.  5. Gastroesophageal reflux disease.  6. History of mitral valve prolapse.      [name], [place].P.  Nanetta Batty, M.D.  Electronically Signed    BB/MEDQ  D:  04/20/2006  T:  04/22/2006  Job:  [id number]   cc:   Dr. Glennie Isle
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
## 2010-09-23 NOTE — Op Note (Signed)
[facility]  Patient:    [name], [name] Visit Number: [id number] MRN: [id number]          Service Type: MED Location: [phone] Attending Physician:  Arlis Porta Dictated by:   Adolph Pollack, M.D. Proc. Date: 11/15/01 Admit Date:  11/14/2001 Discharge Date: 11/16/2001                             Operative Report  PREOPERATIVE DIAGNOSIS:  Chronic cholecystitis with biliary dyskinesia.  POSTOPERATIVE DIAGNOSIS:  Chronic cholecystitis with biliary dyskinesia.  OPERATION PERFORMED:  Laparoscopic cholecystectomy with intraoperative cholangiogram.  SURGEON:  Adolph Pollack, M.D.  ASSISTANT:  Anselm Pancoast. Zachery Dakins, M.D.  ANESTHESIA:  General.  INDICATIONS FOR PROCEDURE:  This 55 year old female has been having intermittent right upper quadrant pains fairly consistent with biliary colic off and on for one year but they have been now worsening over the past two weeks.  She reported to me that in the past, she had undergone an ultrasound which demonstrated small gallstones.  She did have a recent CT scan which was not felt to demonstrate gallstones.  She became more acutely ill and was admitted on July 10.  An ultrasound at that time did not show any gallstones or gallbladder wall thickening.  However, she underwent a hepatobiliary scan with CCK injection which showed delayed emptying of the common bile duct and a depressed ejection fraction.  Liver function tests were normal.  She is now brought to the operating room.  DESCRIPTION OF PROCEDURE:  She was placed supine on the operating table and a general anesthetic was administered.  Her abdomen was sterilely prepped and draped.  A previous subumbilical scar was reincised.  The subcutaneous tissue was divided sharply.  The midline fascia was identified and a small incision made in the midline fascia.  The peritoneal cavity was then entered sharply under direct vision.  Underlying  adhered omentum was cleared off bluntly.  a suture of 0 Vicryl was placed around the fascial edges.  A Hasson trocar was introduced into the peritoneal cavity and a pneumoperitoneum was created by insufflation of CO2 gas.  Next, the laparoscope was introduced.  She was then placed in the appropriate position and an 11 mm trocar was placed through an epigastric incision.  Two 5 mm trocars were placed through two right midabdominal incisions.  The fundus of the gallbladder was grasped and filmy adhesions between the duodenum and the gallbladder were divided sharply. The infundibulum was then grasped and completely mobilized.  The junction between the gallbladder and cystic duct was identified and isolated.  A window was created around the cystic duct.  An anterior branch of the cystic artery was also identified, clipped and divided.   A clip was placed at the cystic duct gallbladder junction.  A small incision was made in the cystic duct and a cholangiocatheter was placed in the cystic duct and a cholangiogram was performed.  Under real time fluoroscopy, dilute contrast material was injected into the cystic duct.  It promptly drained into the common bile duct which promptly drained into the duodenum without obvious evidence of obstruction.  The common and right and left hepatic ducts were also seen.  A final report is pending the radiologists interpretation.  The cholangiocatheter was removed, the cystic clipped three times proximally, then divided.  A posterior branch of the cystic artery was clipped and divided.  The gallbladder was dissected free  from the liver bed with the cautery.  A small puncture wound was made with some leakage of bile.  Once the gallbladder was dissected free from the liver bed, the gallbladder was placed in an Endopouch bag and then the perihepatic area and abdominal cavity were irrigated with 2L of saline until the effluent was clear.  The gallbladder was then  removed through the subumbilical port in the Endopouch bag.  Under direct vision, The subumbilical fascial defect was closed by tightening up and tying down the pursestring suture.  Next, the pneumoperitoneum was released and the trocars were removed.  The skin incisions were closed with 4-0 Monocryl subcuticular stitches. Steri-Strips and sterile dressings were applied.  The patient tolerated the procedure well without any apparent complications and was taken to the recovery room in satisfactory condition.Dictated by:   Adolph Pollack, M.D.  Attending Physician:  Arlis Porta DD:  11/17/01 TD:  11/18/01 Job: [id number]

## 2010-11-04 ENCOUNTER — Other Ambulatory Visit (HOSPITAL_COMMUNITY): Payer: Self-pay | Admitting: Family Medicine

## 2010-11-04 DIAGNOSIS — Z139 Encounter for screening, unspecified: Secondary | ICD-10-CM

## 2010-11-07 ENCOUNTER — Ambulatory Visit (HOSPITAL_COMMUNITY): Payer: Self-pay

## 2010-11-08 ENCOUNTER — Ambulatory Visit (HOSPITAL_COMMUNITY): Admission: RE | Admit: 2010-11-08 | Payer: BC Managed Care – PPO | Source: Ambulatory Visit

## 2010-11-10 ENCOUNTER — Ambulatory Visit (HOSPITAL_COMMUNITY)
Admission: RE | Admit: 2010-11-10 | Discharge: 2010-11-10 | Disposition: A | Discharge status: Home / Self Care | Payer: BC Managed Care – PPO | Source: Ambulatory Visit | Attending: Family Medicine | Admitting: Family Medicine

## 2010-11-10 DIAGNOSIS — Z1231 Encounter for screening mammogram for malignant neoplasm of breast: Secondary | ICD-10-CM | POA: Insufficient documentation

## 2010-11-10 DIAGNOSIS — Z139 Encounter for screening, unspecified: Secondary | ICD-10-CM

## 2010-12-20 ENCOUNTER — Ambulatory Visit (HOSPITAL_COMMUNITY)
Admission: RE | Admit: 2010-12-20 | Discharge: 2010-12-20 | Disposition: A | Discharge status: Home / Self Care | Payer: BC Managed Care – PPO | Source: Ambulatory Visit | Attending: Family Medicine | Admitting: Family Medicine

## 2010-12-20 ENCOUNTER — Other Ambulatory Visit (HOSPITAL_COMMUNITY): Payer: Self-pay | Admitting: Family Medicine

## 2010-12-20 DIAGNOSIS — R0789 Other chest pain: Secondary | ICD-10-CM | POA: Insufficient documentation

## 2010-12-20 DIAGNOSIS — R5381 Other malaise: Secondary | ICD-10-CM | POA: Insufficient documentation

## 2010-12-20 DIAGNOSIS — R5383 Other fatigue: Secondary | ICD-10-CM | POA: Insufficient documentation

## 2010-12-20 DIAGNOSIS — I341 Nonrheumatic mitral (valve) prolapse: Secondary | ICD-10-CM

## 2011-02-03 LAB — POCT HEMOGLOBIN-HEMACUE: Hemoglobin: 13.6

## 2011-03-12 ENCOUNTER — Emergency Department (HOSPITAL_COMMUNITY)
Admission: EM | Admit: 2011-03-12 | Discharge: 2011-03-13 | Disposition: A | Discharge status: Home / Self Care | Payer: BC Managed Care – PPO | Attending: Emergency Medicine | Admitting: Emergency Medicine

## 2011-03-12 ENCOUNTER — Emergency Department (HOSPITAL_COMMUNITY): Payer: BC Managed Care – PPO

## 2011-03-12 DIAGNOSIS — Z79899 Other long term (current) drug therapy: Secondary | ICD-10-CM | POA: Insufficient documentation

## 2011-03-12 DIAGNOSIS — R109 Unspecified abdominal pain: Secondary | ICD-10-CM | POA: Insufficient documentation

## 2011-03-12 DIAGNOSIS — R110 Nausea: Secondary | ICD-10-CM | POA: Insufficient documentation

## 2011-03-12 DIAGNOSIS — W19XXXA Unspecified fall, initial encounter: Secondary | ICD-10-CM

## 2011-03-12 DIAGNOSIS — R51 Headache: Secondary | ICD-10-CM | POA: Insufficient documentation

## 2011-03-12 DIAGNOSIS — M549 Dorsalgia, unspecified: Secondary | ICD-10-CM | POA: Insufficient documentation

## 2011-03-12 DIAGNOSIS — M542 Cervicalgia: Secondary | ICD-10-CM | POA: Insufficient documentation

## 2011-03-12 DIAGNOSIS — M25559 Pain in unspecified hip: Secondary | ICD-10-CM | POA: Insufficient documentation

## 2011-03-12 DIAGNOSIS — S2249XA Multiple fractures of ribs, unspecified side, initial encounter for closed fracture: Secondary | ICD-10-CM | POA: Insufficient documentation

## 2011-03-12 LAB — CBC
MCH: 29.9 pg (ref 26.0–34.0)
Platelets: 273 10*3/uL (ref 150–400)
RBC: 4.25 MIL/uL (ref 3.87–5.11)
WBC: 12 10*3/uL — ABNORMAL HIGH (ref 4.0–10.5)

## 2011-03-12 LAB — BASIC METABOLIC PANEL
Chloride: 103 mEq/L (ref 96–112)
GFR calc Af Amer: >90 mL/min (ref >90)
GFR calc non Af Amer: 78 mL/min — ABNORMAL LOW (ref >90)
Glucose, Bld: 87 mg/dL (ref 70–99)
Potassium: 3.7 mEq/L (ref 3.5–5.1)
Sodium: 139 mEq/L (ref 135–145)

## 2011-03-12 LAB — URINALYSIS, ROUTINE W REFLEX MICROSCOPIC
Bilirubin Urine: NEGATIVE
Leukocytes, UA: NEGATIVE
Nitrite: NEGATIVE
Specific Gravity, Urine: 1.009 (ref 1.005–1.030)
Urobilinogen, UA: 0.2 mg/dL (ref 0.0–1.0)
pH: 6.5 (ref 5.0–8.0)

## 2011-03-12 LAB — DIFFERENTIAL
Basophils Relative: 0 % (ref 0–1)
Eosinophils Absolute: 0.2 10*3/uL (ref 0.0–0.7)
Lymphs Abs: 2.1 10*3/uL (ref 0.7–4.0)
Neutro Abs: 8.9 10*3/uL — ABNORMAL HIGH (ref 1.7–7.7)
Neutrophils Relative %: 74 % (ref 43–77)

## 2011-03-12 MED ORDER — ONDANSETRON HCL 4 MG/2ML IJ SOLN
4.0000 mg | Freq: Once | INTRAMUSCULAR | Status: AC
  Administered 2011-03-12: 4 mg via INTRAVENOUS
  Filled 2011-03-12: qty 2

## 2011-03-12 MED ORDER — MORPHINE SULFATE 4 MG/ML IJ SOLN
8.0000 mg | Freq: Once | INTRAMUSCULAR | Status: AC
  Administered 2011-03-12: 8 mg via INTRAVENOUS
  Filled 2011-03-12: qty 2

## 2011-03-12 MED ORDER — IOHEXOL 300 MG/ML  SOLN
100.0000 mL | Freq: Once | INTRAMUSCULAR | Status: AC | PRN
  Administered 2011-03-12: 100 mL via INTRAVENOUS

## 2011-03-12 MED ORDER — SODIUM CHLORIDE 0.9 % IV SOLN
Freq: Once | INTRAVENOUS | Status: AC
  Administered 2011-03-12 (×2): via INTRAVENOUS

## 2011-03-12 MED ORDER — LORAZEPAM 2 MG/ML IJ SOLN
1.0000 mg | Freq: Once | INTRAMUSCULAR | Status: AC
  Administered 2011-03-12: 1 mg via INTRAVENOUS
  Filled 2011-03-12: qty 1

## 2011-03-12 NOTE — ED Notes (Signed)
Pt to scanner with wes, radiology transport.

## 2011-03-12 NOTE — ED Notes (Signed)
Pt sts she is going to vomit. And pt pt started spitting bubbles of clear sputum and demanding to be rolled. Asked pt to roll back on back and given the yankeur suction to spit into. Pt tolerating well.

## 2011-03-12 NOTE — ED Provider Notes (Addendum)
History     CSN: [id number] Arrival date & time: 03/12/2011  4:48 PM   First MD Initiated Contact with Patient 03/12/11 1658      Chief Complaint  Patient presents with  . Fall    (Consider location/radiation/quality/duration/timing/severity/associated sxs/prior treatment) Patient is a 55 y.o. female presenting with fall. The history is provided by the patient. No language interpreter was used.  Fall The accident occurred 3 to 5 hours ago. Incident: while riding a horse. She fell from a height of 3 to 5 ft. She landed on grass. There was no blood loss. The point of impact was the left hip and left shoulder (left side). The pain is present in the head, neck, left hip and right hip. The pain is moderate. She was ambulatory at the scene. There was no entrapment after the fall. There was no drug use involved in the accident. There was no alcohol use involved in the accident. Associated symptoms include abdominal pain, nausea and headaches. Pertinent negatives include no visual change, no fever, no numbness, no bowel incontinence, no vomiting, no hearing loss, no loss of consciousness and no tingling. The symptoms are aggravated by ambulation and activity. Treatment on scene includes a c-collar and a backboard. She has tried nothing for the symptoms. The treatment provided no relief.    No past medical history on file.  No past surgical history on file.  No family history on file.  History  Substance Use Topics  . Smoking status: Not on file  . Smokeless tobacco: Not on file  . Alcohol Use: Not on file    OB History    No data available      Review of Systems  Constitutional: Negative for fever, diaphoresis, activity change and appetite change.  HENT: Positive for neck pain. Negative for congestion, sore throat, rhinorrhea and neck stiffness.   Respiratory: Negative for cough and shortness of breath.   Cardiovascular: Negative for chest pain and palpitations.  Gastrointestinal:  Positive for nausea and abdominal pain. Negative for vomiting and bowel incontinence.  Genitourinary: Negative for dysuria, urgency, frequency and flank pain.  Musculoskeletal: Positive for back pain.  Skin: Negative for rash and wound.  Neurological: Positive for headaches. Negative for dizziness, tingling, loss of consciousness, weakness and numbness.  All other systems reviewed and are negative.    Allergies  Amoxicillin; Ampicillin; Aspirin; Cephalexin; Ciprofloxacin; Darvocet; Erythromycin; Fentanyl; Heparin; Hydrocodone; Hydrocodone-acetaminophen; Hyoscyamine sulfate; Imipramine hcl; Indocin; Ketorolac tromethamine; Nortriptyline hcl; Nsaids; Oxycodone; Pentazocine-naloxone hcl; Percocet; Sertraline hcl; Talwin; Toradol; and Tramadol hcl  Home Medications   Current Outpatient Rx  Name Route Sig Dispense Refill  . ASPIRIN 81 MG PO TABS Oral Take 81 mg by mouth every evening.     Marland Kitchen PROZAC PO Oral Take 60 mg by mouth every morning.     Marland Kitchen NEURONTIN PO Oral Take 900 mg by mouth 2 (two) times daily.     Marland Kitchen NEURONTIN PO Oral Take 1,800 mg by mouth every evening.     Marland Kitchen HYDROXYZINE PAMOATE 100 MG PO CAPS Oral Take 100 mg by mouth at bedtime.      Marland Kitchen LORAZEPAM 1 MG PO TABS Oral Take 1 mg by mouth 2 (two) times daily as needed. For anxiety.    . DEMEROL PO Oral Take 1 mg by mouth daily as needed. For pain.    Marland Kitchen METOPROLOL TARTRATE 25 MG PO TABS Oral Take 25 mg by mouth every evening.     Marland Kitchen PROCHLORPERAZINE MALEATE 10 MG PO  TABS Oral Take 10 mg by mouth 2 (two) times daily.      Marland Kitchen PHENERGAN PO Oral Take 50 mg by mouth as needed. For nausea. Take with Demerol.      BP 107/78  Pulse 90  Temp(Src) 98.1 F (36.7 C) (Oral)  Resp 11  SpO2 95%  Physical Exam  Nursing note and vitals reviewed. Constitutional: She is oriented to person, place, and time. She appears well-developed and well-nourished. She appears distressed.  HENT:  Head: Normocephalic and atraumatic.  Right Ear: External ear  normal.  Left Ear: External ear normal.  Mouth/Throat: Oropharynx is clear and moist.  Eyes: Conjunctivae and EOM are normal. Pupils are equal, round, and reactive to light.  Neck: Spinous process tenderness and muscular tenderness present. No tracheal deviation present.       In c-collar.  Unable to clear after imaging  Cardiovascular: Normal rate, regular rhythm, normal heart sounds and intact distal pulses.   Pulmonary/Chest: Effort normal and breath sounds normal. No respiratory distress. She exhibits no tenderness.  Abdominal: Soft. Bowel sounds are normal. There is tenderness (diffuse.  no external signs trauma). There is no rebound and no guarding.  Musculoskeletal: Normal range of motion. She exhibits no tenderness.  Neurological: She is alert and oriented to person, place, and time.  Skin: Skin is warm and dry. No rash noted.    ED Course  Procedures (including critical care time)  Labs Reviewed  CBC - Abnormal; Notable for the following:    WBC 12.0 (*)    All other components within normal limits  DIFFERENTIAL - Abnormal; Notable for the following:    Neutro Abs 8.9 (*)    All other components within normal limits  BASIC METABOLIC PANEL - Abnormal; Notable for the following:    GFR calc non Af Amer 78 (*)    All other components within normal limits  URINALYSIS, ROUTINE W REFLEX MICROSCOPIC   Ct Head Wo Contrast  03/12/2011  *RADIOLOGY REPORT*  Clinical Data: Neck pain status post fall from horse.  CT CERVICAL SPINE WITHOUT CONTRAST,CT HEAD WITHOUT CONTRAST  Technique:  Multidetector CT imaging of the cervical spine was performed. Multiplanar CT image reconstructions were also generated.,Technique:  Contiguous axial images were obtained from the base of the skull through the vertex without contrast.  Comparison: 10/11/2008  Findings: There is no evidence for acute hemorrhage, hydrocephalus, mass lesion, or abnormal extra-axial fluid collection.  No definite CT evidence for  acute infarction.  The visualized paranasal sinuses and mastoid air cells are predominately clear.  Cervical spine:  Maintained craniocervical relationship.  Status post posterior facet fusion of C2-C4 and anterior fusion of C4-C7. There is loss of normal cervical lordosis which is likely secondary to effusion.  No acute fracture or dislocation identified.  No aggressive osseous lesion.  No prevertebral or paravertebral soft tissue swelling.  IMPRESSION: No acute intracranial abnormality.  Status post posterior fusion of C2-C4 and anterior fusion of C4- C7. No acute fracture or dislocation identified.  Original Report Authenticated By: Waneta Martins, M.D.   Ct Chest W Contrast  03/12/2011  *RADIOLOGY REPORT*  Clinical Data: Left-sided back pain and abdominal pain status post fall.  CT ABDOMEN AND PELVIS WITH CONTRAST,CT CHEST WITH CONTRAST  Technique:  Multidetector CT imaging of the abdomen and pelvis was performed following the standard protocol during bolus administration of intravenous contrast.,Technique:  Multidetector CT imaging of the chest was performed following the standard protoc  Contrast: OMNIPAQUE IOHEXOL 300 MG/ML  IV SOLN  Comparison: 03/12/2011 radiograph12/06/2006 CT  Findings:  Chest:  Normal caliber aorta.  Normal heart size.  No pericardial or pleural effusion.  No intrathoracic lymphadenopathy.  The central airways are patent.  Lungs are clear.  No pneumothorax.  Cervical fusion hardware is partially imaged. Nondisplaced posterior 10th and 11th left rib fractures.  No acute thoracic vertebral body fracture or sternal fracture.  Mild multilevel degenerative changes.  Abdomen pelvis:  Unremarkable liver, spleen, pancreas, adrenal glands.  There is mild fullness of the common bile duct, measuring up to 9 mm.  This is nonspecific status post cholecystectomy and smoothly tapers to the level of the ampulla.  Symmetric renal enhancement.  There is a nonobstructing left lower pole stone.   No hydronephrosis or hydroureter.  No bowel obstruction no CT evidence for colitis.  No free intraperitoneal air or fluid.  There is mild periumbilical subcutaneous fat stranding  Thin-walled bladder.  Absent uterus.  No adnexal mass.  Scattered atherosclerotic calcification of the aorta and its branches.  L4-5 degenerative disc disease with grade 1 anterolisthesis and pars defects.  There is mild vertebral body height loss at T8 however does not appear acute.  No acute fracture within the abdomen.  Anterior right thigh musculature lipoma.  IMPRESSION: Nondisplaced posterior left tenth and eleventh rib fractures.  Subcutaneous periumbilical fat stranding may be secondary to trauma.  Otherwise, no acute or traumatic abnormality within the chest and pelvis.  Nonobstructing left lower pole renal stone.  Original Report Authenticated By: Waneta Martins, M.D.   Ct Cervical Spine Wo Contrast  03/12/2011  *RADIOLOGY REPORT*  Clinical Data: Neck pain status post fall from horse.  CT CERVICAL SPINE WITHOUT CONTRAST,CT HEAD WITHOUT CONTRAST  Technique:  Multidetector CT imaging of the cervical spine was performed. Multiplanar CT image reconstructions were also generated.,Technique:  Contiguous axial images were obtained from the base of the skull through the vertex without contrast.  Comparison: 10/11/2008  Findings: There is no evidence for acute hemorrhage, hydrocephalus, mass lesion, or abnormal extra-axial fluid collection.  No definite CT evidence for acute infarction.  The visualized paranasal sinuses and mastoid air cells are predominately clear.  Cervical spine:  Maintained craniocervical relationship.  Status post posterior facet fusion of C2-C4 and anterior fusion of C4-C7. There is loss of normal cervical lordosis which is likely secondary to effusion.  No acute fracture or dislocation identified.  No aggressive osseous lesion.  No prevertebral or paravertebral soft tissue swelling.  IMPRESSION: No acute  intracranial abnormality.  Status post posterior fusion of C2-C4 and anterior fusion of C4- C7. No acute fracture or dislocation identified.  Original Report Authenticated By: Waneta Martins, M.D.   Ct Abdomen Pelvis W Contrast  03/12/2011  *RADIOLOGY REPORT*  Clinical Data: Left-sided back pain and abdominal pain status post fall.  CT ABDOMEN AND PELVIS WITH CONTRAST,CT CHEST WITH CONTRAST  Technique:  Multidetector CT imaging of the abdomen and pelvis was performed following the standard protocol during bolus administration of intravenous contrast.,Technique:  Multidetector CT imaging of the chest was performed following the standard protoc  Contrast: OMNIPAQUE IOHEXOL 300 MG/ML IV SOLN  Comparison: 03/12/2011 radiograph12/06/2006 CT  Findings:  Chest:  Normal caliber aorta.  Normal heart size.  No pericardial or pleural effusion.  No intrathoracic lymphadenopathy.  The central airways are patent.  Lungs are clear.  No pneumothorax.  Cervical fusion hardware is partially imaged. Nondisplaced posterior 10th and 11th left rib fractures.  No acute thoracic vertebral body fracture  or sternal fracture.  Mild multilevel degenerative changes.  Abdomen pelvis:  Unremarkable liver, spleen, pancreas, adrenal glands.  There is mild fullness of the common bile duct, measuring up to 9 mm.  This is nonspecific status post cholecystectomy and smoothly tapers to the level of the ampulla.  Symmetric renal enhancement.  There is a nonobstructing left lower pole stone.  No hydronephrosis or hydroureter.  No bowel obstruction no CT evidence for colitis.  No free intraperitoneal air or fluid.  There is mild periumbilical subcutaneous fat stranding  Thin-walled bladder.  Absent uterus.  No adnexal mass.  Scattered atherosclerotic calcification of the aorta and its branches.  L4-5 degenerative disc disease with grade 1 anterolisthesis and pars defects.  There is mild vertebral body height loss at T8 however does not appear  acute.  No acute fracture within the abdomen.  Anterior right thigh musculature lipoma.  IMPRESSION: Nondisplaced posterior left tenth and eleventh rib fractures.  Subcutaneous periumbilical fat stranding may be secondary to trauma.  Otherwise, no acute or traumatic abnormality within the chest and pelvis.  Nonobstructing left lower pole renal stone.  Original Report Authenticated By: Waneta Martins, M.D.   Dg Pelvis Portable  03/12/2011  *RADIOLOGY REPORT*  Clinical Data: Fall off horse.  PORTABLE PELVIS  Comparison: None.  Findings: No acute bony abnormality.  Specifically, no fracture, subluxation, or dislocation.  Soft tissues are intact.  IMPRESSION: No acute bony abnormality.  Original Report Authenticated By: Cyndie Chime, M.D.   Dg Chest Portable 1 View  03/12/2011  *RADIOLOGY REPORT*  Clinical Data: Fall off horse.  Back pain.  PORTABLE CHEST - 1 VIEW  Comparison: None.  Findings: Heart and mediastinal contours are within normal limits. No focal opacities or effusions.  No acute bony abnormality.No pneumothorax.  IMPRESSION: No active cardiopulmonary disease.  Original Report Authenticated By: Cyndie Chime, M.D.     1. Fall   2. Multiple rib fractures       MDM  Patient arrived in full C-spine precautions after falling from her horse. Imaging was unremarkable except for 2 rib fractures there consecutive. There is no flail segments. She is breathing comfortably. Pain is adequately controlled in the emergency department with morphine. She has multiple allergies. We're unable to clinically cleared her C-spine therefore she'll be sent home in an Aspen collar. She'll be instructed to followup with neurosurgery for further evaluation and treatment. Remainder of her workup was relatively unremarkable. Patient will be discharged home with aggressive pain management. She refused additional pain medication given her multiple allergies and states she has Demerol at home which she can take.  She'll be discharged with incentive spirometry. There is no need to consult trauma on this patient. There is no concern for pulmonary contusions.        Dayton Bailiff, MD 03/13/11 1610  Dayton Bailiff, MD 03/13/11 [phone]

## 2011-03-14 ENCOUNTER — Encounter: Payer: Self-pay | Admitting: Cardiovascular Disease

## 2011-03-15 ENCOUNTER — Encounter: Payer: Self-pay | Admitting: Cardiovascular Disease

## 2011-03-15 ENCOUNTER — Ambulatory Visit (INDEPENDENT_AMBULATORY_CARE_PROVIDER_SITE_OTHER): Payer: BC Managed Care – PPO | Admitting: Cardiovascular Disease

## 2011-03-15 VITALS — BP 120/80 | HR 77 | Ht 64.0 in | Wt 184.0 lb

## 2011-03-15 DIAGNOSIS — R0600 Dyspnea, unspecified: Secondary | ICD-10-CM | POA: Insufficient documentation

## 2011-03-15 DIAGNOSIS — R0609 Other forms of dyspnea: Secondary | ICD-10-CM

## 2011-03-15 NOTE — Progress Notes (Signed)
History of Present Illness:55 yo WF with history DVT, mitral valve prolapse, anxiety and depression, allergic rhinitis here today new cardiac visit. She was seen recently by Dr. Sudie Bailey and reported that her son was recently diagnosed with hypertrophic cardiomyopathy. She tells me that she has had several syncopal spells over the last few months. She describes pain in her chest at different times during the day, mostly at rest. She has worsened dyspnea. She is worried this is her heart. I have reviewed the records extensively and discovered that she had a cardiac workup in 2005 and 2007 by Dr. Allyson Sabal with [facility]. Normal cardiac cath 2005 and 2007 with no evidence of CAD. Normal echo 2003 with no evidence of LVH, LV dysfunction or valvular disease.    Past Medical History  Diagnosis Date  . Mitral valve prolapse   . Anxiety   . Depression   . Allergic rhinitis   . Broken ribs     Trauma falling off of horse November 2012    Past Surgical History  Procedure Date  . Cervical spine surgery   . Right arm surgery   . Lower back surgery   . Cholescystectomy   . Appendectomy   . Tonsillectomy   . Abdominal hysterectomy   . Oophorectomy     Current Outpatient Prescriptions  Medication Sig Dispense Refill  . aspirin 81 MG tablet Take 81 mg by mouth every evening.       Marland Kitchen FLUoxetine HCl (PROZAC PO) Take 60 mg by mouth every morning.       . Gabapentin (NEURONTIN PO) Take 1,800 mg by mouth every evening. PT TAKES THIS DOSE ALONG WITH THE OTHER DOSE      . hydrOXYzine (VISTARIL) 100 MG capsule Take 100 mg by mouth at bedtime.        Marland Kitchen LORazepam (ATIVAN) 1 MG tablet Take 1 mg by mouth 2 (two) times daily as needed. For anxiety.      . Meperidine HCl (DEMEROL PO) Take 1 mg by mouth daily as needed. For pain.      . metoprolol tartrate (LOPRESSOR) 25 MG tablet Take 25 mg by mouth every evening.       . prochlorperazine (COMPAZINE) 10 MG tablet Take 10 mg by mouth 2 (two)  times daily.        . Promethazine HCl (PHENERGAN PO) Take 50 mg by mouth as needed. For nausea. Take with Demerol.      . Gabapentin (NEURONTIN PO) Take 900 mg by mouth 2 (two) times daily.       . metoprolol succinate (TOPROL-XL) 25 MG 24 hr tablet       . omeprazole (PRILOSEC) 20 MG capsule         Allergies  Allergen Reactions  . Amoxicillin     REACTION: Stomach bleeding  . Ampicillin     REACTION: Stomach bleeding  . Aspirin     REACTION: ulcer and GI bleeding  . Cephalexin     REACTION: Hives  . Ciprofloxacin     REACTION: Throat swells shut  . Darvocet (Propoxyphene N-Acetaminophen)     REACTION: Vomitting  . Erythromycin     REACTION: stomach ache  . Fentanyl   . Heparin     MAKES HEART STOP BEATING.   Marland Kitchen Hydrocodone     REACTION: vomiting  . Hydrocodone-Acetaminophen     REACTION: Vomitting  . Hyoscyamine Sulfate     REACTION: Not sure  . Imipramine Hcl  REACTION: Itching  . Indocin     MAKES HEART STOP BEATING.   Marland Kitchen Ketorolac Tromethamine     REACTION: Vomitting  . Nortriptyline Hcl     REACTION: Hyperactive  . Nsaids     REACTION: Stomach bleeding  . Oxycodone   . Pentazocine-Naloxone Hcl     REACTION: Vomitting  . Percocet (Oxycodone-Acetaminophen)     REACTION: Vomitting  . Sertraline Hcl     REACTION: Hyperactive - bounce off the wall  . Talwin   . Toradol   . Tramadol Hcl     REACTION: Vomitting    History   Social History  . Marital Status: Married    Spouse Name: N/A    Number of Children: 3  . Years of Education: N/A   Occupational History  . Disability    Social History Main Topics  . Smoking status: Never Smoker   . Smokeless tobacco: Not on file  . Alcohol Use: No  . Drug Use: No  . Sexually Active: Not on file   Other Topics Concern  . Not on file   Social History Narrative  . No narrative on file    Family History  Problem Relation Age of Onset  . Heart attack Brother     CABG  . Hypertrophic cardiomyopathy  Son   . Cancer Mother     Uterine  . Cancer Father     brain    Review of Systems:  As stated in the HPI and otherwise negative.   BP 120/80  Pulse 77  Ht 5\' 4"  (1.626 m)  Wt 184 lb (83.462 kg)  BMI 31.58 kg/m2  Physical Examination: General: Well developed, well nourished, NAD HEENT: OP clear, mucus membranes moist SKIN: warm, dry. No rashes. Neuro: No focal deficits Musculoskeletal: Muscle strength 5/5 all ext Psychiatric: Mood and affect normal Neck: No JVD, no carotid bruits, no thyromegaly, no lymphadenopathy. Lungs:Clear bilaterally, no wheezes, rhonci, crackles Cardiovascular: Regular rate and rhythm. No murmurs, gallops or rubs. Abdomen:Soft. Bowel sounds present. Non-tender.  Extremities: No lower extremity edema. Pulses are 2 + in the bilateral DP/PT.

## 2011-03-15 NOTE — Assessment & Plan Note (Signed)
This does not sound cardiac. Her son has recently diagnosed HOCM per pt. Her echo in 2003 had no evidence of LVH or septal hypertrophy. I will repeat an echo to assess her LV function. She had a normal cardiac cath in 2005 and 2007 with no evidence of CAD. An ischemic workup is not indicated at this time. I will call her with her echo results. Follow up prn.

## 2011-03-15 NOTE — Patient Instructions (Signed)
Your physician recommends that you schedule a follow-up appointment as needed   Your physician has requested that you have an echocardiogram. Echocardiography is a painless test that uses sound waves to create images of your heart. It provides your doctor with information about the size and shape of your heart and how well your heart's chambers and valves are working. This procedure takes approximately one hour. There are no restrictions for this procedure.    

## 2011-03-16 ENCOUNTER — Encounter (HOSPITAL_COMMUNITY): Payer: Self-pay | Admitting: Emergency Medicine

## 2011-04-25 ENCOUNTER — Other Ambulatory Visit (HOSPITAL_COMMUNITY): Payer: BC Managed Care – PPO | Admitting: Radiology

## 2011-05-24 ENCOUNTER — Emergency Department (HOSPITAL_COMMUNITY): Payer: BC Managed Care – PPO

## 2011-05-24 ENCOUNTER — Emergency Department (HOSPITAL_COMMUNITY)
Admission: EM | Admit: 2011-05-24 | Discharge: 2011-05-24 | Disposition: A | Discharge status: Home / Self Care | Payer: BC Managed Care – PPO | Attending: Emergency Medicine | Admitting: Emergency Medicine

## 2011-05-24 ENCOUNTER — Encounter (HOSPITAL_COMMUNITY): Payer: Self-pay

## 2011-05-24 ENCOUNTER — Other Ambulatory Visit: Payer: Self-pay

## 2011-05-24 DIAGNOSIS — Z7982 Long term (current) use of aspirin: Secondary | ICD-10-CM | POA: Insufficient documentation

## 2011-05-24 DIAGNOSIS — R110 Nausea: Secondary | ICD-10-CM | POA: Insufficient documentation

## 2011-05-24 DIAGNOSIS — Z86718 Personal history of other venous thrombosis and embolism: Secondary | ICD-10-CM | POA: Insufficient documentation

## 2011-05-24 DIAGNOSIS — M79609 Pain in unspecified limb: Secondary | ICD-10-CM | POA: Insufficient documentation

## 2011-05-24 DIAGNOSIS — Z79899 Other long term (current) drug therapy: Secondary | ICD-10-CM | POA: Insufficient documentation

## 2011-05-24 DIAGNOSIS — M542 Cervicalgia: Secondary | ICD-10-CM | POA: Insufficient documentation

## 2011-05-24 DIAGNOSIS — R51 Headache: Secondary | ICD-10-CM | POA: Insufficient documentation

## 2011-05-24 DIAGNOSIS — F341 Dysthymic disorder: Secondary | ICD-10-CM | POA: Insufficient documentation

## 2011-05-24 LAB — CBC
HCT: 40 % (ref 36.0–46.0)
Hemoglobin: 13.5 g/dL (ref 12.0–15.0)
MCH: 29.1 pg (ref 26.0–34.0)
MCHC: 33.8 g/dL (ref 30.0–36.0)
MCV: 86.2 fL (ref 78.0–100.0)
Platelets: 298 10*3/uL (ref 150–400)
RBC: 4.64 MIL/uL (ref 3.87–5.11)
RDW: 12.9 % (ref 11.5–15.5)
WBC: 11 10*3/uL — ABNORMAL HIGH (ref 4.0–10.5)

## 2011-05-24 LAB — POCT I-STAT, CHEM 8
BUN: 17 mg/dL (ref 6–23)
Chloride: 102 mEq/L (ref 96–112)
Creatinine, Ser: 1.1 mg/dL (ref 0.50–1.10)
Potassium: 4.5 mEq/L (ref 3.5–5.1)
Sodium: 140 mEq/L (ref 135–145)

## 2011-05-24 LAB — DIFFERENTIAL
Lymphs Abs: 3.3 10*3/uL (ref 0.7–4.0)
Monocytes Relative: 7 % (ref 3–12)
Neutro Abs: 6.7 10*3/uL (ref 1.7–7.7)
Neutrophils Relative %: 61 % (ref 43–77)

## 2011-05-24 MED ORDER — DIAZEPAM 5 MG PO TABS: 5.0000 mg | ORAL_TABLET | Freq: Once | ORAL | Status: DC

## 2011-05-24 MED ORDER — MORPHINE SULFATE 4 MG/ML IJ SOLN
4.0000 mg | Freq: Once | INTRAMUSCULAR | Status: AC
  Administered 2011-05-24: 4 mg via INTRAVENOUS
  Filled 2011-05-24: qty 1

## 2011-05-24 MED ORDER — PROMETHAZINE HCL 25 MG/ML IJ SOLN
25.0000 mg | Freq: Once | INTRAMUSCULAR | Status: AC
  Administered 2011-05-24: 25 mg via INTRAVENOUS
  Filled 2011-05-24: qty 1

## 2011-05-24 MED ORDER — SODIUM CHLORIDE 0.9 % IV BOLUS (SEPSIS)
500.0000 mL | Freq: Once | INTRAVENOUS | Status: AC
  Administered 2011-05-24: 500 mL via INTRAVENOUS

## 2011-05-24 NOTE — ED Provider Notes (Signed)
History     CSN: [id number]  Arrival date & time 05/24/11  1638   First MD Initiated Contact with Patient 05/24/11 1802      Chief Complaint  Patient presents with  . Headache    (Consider location/radiation/quality/duration/timing/severity/associated sxs/prior treatment) Patient is a 56 y.o. female presenting with headaches. The history is provided by the patient.  Headache  This is a new problem. Pertinent negatives include no shortness of breath, no nausea and no vomiting.  patient's had a headache across the right upper head and goes across the left side of her head.  She has a pain that radiates down her neck into her arm. . No fevers. She had a history of migraines, but they have resolved after her ganglions were cut.no fevers. Some nauseousness without vomiting.no diarrhea. No weakness. No relief with her pain medications at home.  Past Medical History  Diagnosis Date  . Mitral valve prolapse   . Anxiety   . Depression   . Allergic rhinitis   . Broken ribs     Trauma falling off of horse November 2012  . DVT (deep venous thrombosis)     Past Surgical History  Procedure Date  . Cervical spine surgery   . Right arm surgery   . Lower back surgery   . Cholescystectomy   . Appendectomy   . Tonsillectomy   . Abdominal hysterectomy   . Oophorectomy     Family History  Problem Relation Age of Onset  . Heart attack Brother     CABG  . Hypertrophic cardiomyopathy Son   . Cancer Mother     Uterine  . Cancer Father     brain    History  Substance Use Topics  . Smoking status: Never Smoker   . Smokeless tobacco: Not on file  . Alcohol Use: No    OB History    Grav Para Term Preterm Abortions TAB SAB Ect Mult Living                  Review of Systems  Constitutional: Negative for activity change and appetite change.  HENT: Negative for neck stiffness.   Eyes: Negative for pain.  Respiratory: Negative for chest tightness and shortness of breath.     Cardiovascular: Negative for chest pain and leg swelling.  Gastrointestinal: Negative for nausea, vomiting, abdominal pain and diarrhea.  Genitourinary: Negative for flank pain.  Musculoskeletal: Negative for back pain.       Neck pain  Skin: Negative for rash.  Neurological: Positive for headaches. Negative for weakness and numbness (patient is tingling down left arm).  Psychiatric/Behavioral: Negative for behavioral problems.    Allergies  Amoxicillin; Ampicillin; Aspirin; Cephalexin; Ciprofloxacin; Darvocet; Erythromycin; Fentanyl; Heparin; Hydrocodone; Hydrocodone-acetaminophen; Hyoscyamine sulfate; Imipramine hcl; Indocin; Ketorolac tromethamine; Nortriptyline hcl; Nsaids; Oxycodone; Pentazocine-naloxone hcl; Percocet; Sertraline hcl; Talwin; Toradol; and Tramadol hcl  Home Medications   Current Outpatient Rx  Name Route Sig Dispense Refill  . ASPIRIN 81 MG PO TABS Oral Take 81 mg by mouth every evening.     Marland Kitchen PROZAC PO Oral Take 60 mg by mouth every morning.     Marland Kitchen NEURONTIN PO Oral Take 900 mg by mouth 2 (two) times daily.     Marland Kitchen NEURONTIN PO Oral Take 1,800 mg by mouth every evening. PT TAKES THIS DOSE ALONG WITH THE OTHER DOSE    . HYDROXYZINE PAMOATE 100 MG PO CAPS Oral Take 100 mg by mouth at bedtime.      Marland Kitchen  LORAZEPAM 1 MG PO TABS Oral Take 1 mg by mouth 4 (four) times daily as needed. For anxiety.    . DEMEROL PO Oral Take 1 mg by mouth daily as needed. For pain.    Marland Kitchen METOPROLOL SUCCINATE ER 25 MG PO TB24      . OMEPRAZOLE 20 MG PO CPDR      . PROCHLORPERAZINE MALEATE 10 MG PO TABS Oral Take 10 mg by mouth 2 (two) times daily.      Marland Kitchen PHENERGAN PO Oral Take 50 mg by mouth as needed. For nausea. Take with Demerol.      BP 114/72  Pulse 80  Temp 98.5 F (36.9 C)  Resp 20  SpO2 95%  Physical Exam  Nursing note and vitals reviewed. Constitutional: She is oriented to person, place, and time. She appears well-developed and well-nourished.  HENT:  Head: Normocephalic and  atraumatic.  Eyes: EOM are normal. Pupils are equal, round, and reactive to light.  Neck: Normal range of motion. Neck supple.  Cardiovascular: Normal rate, regular rhythm and normal heart sounds.   No murmur heard. Pulmonary/Chest: Effort normal and breath sounds normal. No respiratory distress. She has no wheezes. She has no rales.  Abdominal: Soft. Bowel sounds are normal. She exhibits no distension. There is no tenderness. There is no rebound and no guarding.  Musculoskeletal: Normal range of motion.  Neurological: She is alert and oriented to person, place, and time. No cranial nerve deficit.  Skin: Skin is warm and dry.  Psychiatric: She has a normal mood and affect. Her speech is normal.    ED Course  Procedures (including critical care time)  Labs Reviewed  CBC - Abnormal; Notable for the following:    WBC 11.0 (*)    All other components within normal limits  POCT I-STAT, CHEM 8 - Abnormal; Notable for the following:    Glucose, Bld 100 (*)    All other components within normal limits  DIFFERENTIAL  TROPONIN I  LAB REPORT - SCANNED  I-STAT, CHEM 8   Dg Chest 2 View  05/24/2011  *RADIOLOGY REPORT*  Clinical Data: Chest pain  CHEST - 2 VIEW  Comparison:  12/20/2010  Findings:  The heart size and mediastinal contours are within normal limits.  Both lungs are clear.  The visualized skeletal structures are unremarkable. Lower cervical fusion hardware partially imaged.  Prior cholecystectomy noted.  IMPRESSION: No active cardiopulmonary disease.  Original Report Authenticated By: Judie Petit. Ruel Favors, M.D.   Ct Head Wo Contrast  05/24/2011  *RADIOLOGY REPORT*  Clinical Data:  Headache  CT HEAD WITHOUT CONTRAST  Technique:  Contiguous axial images were obtained from the base of the skull through the vertex without contrast  Comparison:  03/12/2011.  Findings:  The brain has a normal appearance without evidence for hemorrhage, acute infarction, hydrocephalus, or mass lesion.  There is no  extra axial fluid collection.  The skull and paranasal sinuses are normal.  IMPRESSION: Normal CT of the head without contrast.  Original Report Authenticated By: Elsie Stain, M.D.     1. Headache   2. Neck pain       MDM  Date history of different headaches. CT negative. Also an acute on chronic neck pain. No relief with home medications. Does not appear to be cardiac cause of the neck pain. She'll be discharged home follow with her Dr. She has home medicines        Juliet Rude. Rubin Payor, MD 05/25/11 1625

## 2011-05-24 NOTE — ED Notes (Signed)
In to reassess; pt lying on stretcher texting, appears to be resting comfortably; daughter at bedside; pt states pain is "no better at all" despite Morphine 8 mg total and Phenergan 25 mg; skin pink, warm, dry; HR 80;  advised pt that I would inform MD of same

## 2011-05-24 NOTE — ED Notes (Signed)
Pt presents with R temporal headache that began yesterday.  Pt reports pain has been constant and radiates across frontal area, into L shoulder with weakness reported to L arm.  +nausea and chills/sweats.  Pt reports h/o migraines with these symptoms worse.  +shortness of breath.

## 2011-05-24 NOTE — ED Notes (Signed)
MD aware of pt's report of continued pain - no orders given at this time

## 2011-05-24 NOTE — ED Notes (Signed)
Reports onset of frontal headache - constant - becoming progresssively worse - has tried Demerol at home for pain without relief; also endorses left neck and left shoulder pain which she related to her h/a; known hx of migraines; however, states this h/a is "different" as it is unresolved; states has interm  slurred speech as well as LUE and LLE weakness; on physical exam, pt noted to have weakness to LUE and LLE;  Ambulates without difficulty; speech currently clear with no facial asymmetry; also reports interm periods of confusion - states gets family members mixed up

## 2011-05-24 NOTE — ED Notes (Signed)
From xray - states h/a is "better"; however, continues to c/o left neck/shoulder pain as well as substernal chest pain; assisted to restroom via wheelchair per tech

## 2011-08-28 ENCOUNTER — Emergency Department (HOSPITAL_COMMUNITY): Payer: BC Managed Care – PPO

## 2011-08-28 ENCOUNTER — Inpatient Hospital Stay (HOSPITAL_COMMUNITY)
Admission: EM | Admit: 2011-08-28 | Discharge: 2011-08-30 | DRG: 090 | Disposition: A | Discharge status: Home / Self Care | Payer: BC Managed Care – PPO | Source: Ambulatory Visit | Attending: Internal Medicine | Admitting: Internal Medicine

## 2011-08-28 ENCOUNTER — Encounter (HOSPITAL_COMMUNITY): Payer: Self-pay | Admitting: Emergency Medicine

## 2011-08-28 DIAGNOSIS — G589 Mononeuropathy, unspecified: Secondary | ICD-10-CM | POA: Diagnosis present

## 2011-08-28 DIAGNOSIS — R079 Chest pain, unspecified: Secondary | ICD-10-CM | POA: Diagnosis present

## 2011-08-28 DIAGNOSIS — G894 Chronic pain syndrome: Secondary | ICD-10-CM | POA: Diagnosis present

## 2011-08-28 DIAGNOSIS — R0902 Hypoxemia: Secondary | ICD-10-CM

## 2011-08-28 DIAGNOSIS — IMO0001 Reserved for inherently not codable concepts without codable children: Secondary | ICD-10-CM | POA: Diagnosis present

## 2011-08-28 DIAGNOSIS — J189 Pneumonia, unspecified organism: Principal | ICD-10-CM | POA: Diagnosis present

## 2011-08-28 DIAGNOSIS — F341 Dysthymic disorder: Secondary | ICD-10-CM | POA: Diagnosis present

## 2011-08-28 HISTORY — DX: Gastro-esophageal reflux disease without esophagitis: K21.9

## 2011-08-28 HISTORY — DX: Cerebral infarction, unspecified: I63.9

## 2011-08-28 HISTORY — DX: Unspecified convulsions: R56.9

## 2011-08-28 HISTORY — DX: Chronic pain syndrome: G89.4

## 2011-08-28 HISTORY — DX: Pneumonia, unspecified organism: J18.9

## 2011-08-28 HISTORY — DX: Other chest pain: R07.89

## 2011-08-28 HISTORY — DX: Shortness of breath: R06.02

## 2011-08-28 HISTORY — DX: Other intervertebral disc displacement, lumbar region: M51.26

## 2011-08-28 LAB — POCT I-STAT TROPONIN I
Troponin i, poc: 0 ng/mL (ref 0.00–0.08)
Troponin i, poc: 0 ng/mL (ref 0.00–0.08)
Troponin i, poc: 0 ng/mL (ref 0.00–0.08)

## 2011-08-28 LAB — CBC
HCT: 38.3 % (ref 36.0–46.0)
Hemoglobin: 12.8 g/dL (ref 12.0–15.0)
MCH: 29.1 pg (ref 26.0–34.0)
MCHC: 33.4 g/dL (ref 30.0–36.0)
MCV: 87 fL (ref 78.0–100.0)
Platelets: 249 10*3/uL (ref 150–400)
RBC: 4.4 MIL/uL (ref 3.87–5.11)
RDW: 12.9 % (ref 11.5–15.5)
WBC: 7.6 10*3/uL (ref 4.0–10.5)

## 2011-08-28 LAB — BASIC METABOLIC PANEL
BUN: 10 mg/dL (ref 6–23)
CO2: 23 mEq/L (ref 19–32)
Calcium: 8.8 mg/dL (ref 8.4–10.5)
Chloride: 101 mEq/L (ref 96–112)
Creatinine, Ser: 0.92 mg/dL (ref 0.50–1.10)
GFR calc Af Amer: 80 mL/min — ABNORMAL LOW (ref >90)
GFR calc non Af Amer: 69 mL/min — ABNORMAL LOW (ref >90)
Glucose, Bld: 105 mg/dL — ABNORMAL HIGH (ref 70–99)
Potassium: 3.9 mEq/L (ref 3.5–5.1)
Sodium: 136 mEq/L (ref 135–145)

## 2011-08-28 LAB — PRO B NATRIURETIC PEPTIDE: Pro B Natriuretic peptide (BNP): 582.1 pg/mL — ABNORMAL HIGH (ref 0–125)

## 2011-08-28 MED ORDER — SODIUM CHLORIDE 0.9 % IV SOLN
20.0000 mL | INTRAVENOUS | Status: DC
  Administered 2011-08-28: 20 mL via INTRAVENOUS

## 2011-08-28 MED ORDER — GABAPENTIN 600 MG PO TABS
1800.0000 mg | ORAL_TABLET | Freq: Once | ORAL | Status: AC
  Administered 2011-08-28: 1800 mg via ORAL
  Filled 2011-08-28: qty 3

## 2011-08-28 MED ORDER — GABAPENTIN 300 MG PO CAPS: 900.0000 mg | ORAL_CAPSULE | Freq: Three times a day (TID) | ORAL | Status: DC

## 2011-08-28 MED ORDER — MORPHINE SULFATE 4 MG/ML IJ SOLN
4.0000 mg | Freq: Once | INTRAMUSCULAR | Status: AC
  Administered 2011-08-28: 4 mg via INTRAVENOUS
  Filled 2011-08-28: qty 1

## 2011-08-28 MED ORDER — SODIUM CHLORIDE 0.9 % IV BOLUS (SEPSIS)
2000.0000 mL | Freq: Once | INTRAVENOUS | Status: AC
  Administered 2011-08-28: 2000 mL via INTRAVENOUS

## 2011-08-28 MED ORDER — ASPIRIN 81 MG PO CHEW
324.0000 mg | CHEWABLE_TABLET | Freq: Once | ORAL | Status: AC
  Administered 2011-08-28: 324 mg via ORAL
  Filled 2011-08-28: qty 4

## 2011-08-28 MED ORDER — LORAZEPAM 1 MG PO TABS
1.0000 mg | ORAL_TABLET | Freq: Four times a day (QID) | ORAL | Status: DC | PRN
  Administered 2011-08-28 – 2011-08-29 (×3): 1 mg via ORAL
  Filled 2011-08-28 (×3): qty 1

## 2011-08-28 MED ORDER — DIPHENHYDRAMINE HCL 50 MG/ML IJ SOLN
INTRAMUSCULAR | Status: AC
  Administered 2011-08-28: 25 mg
  Filled 2011-08-28: qty 1

## 2011-08-28 NOTE — ED Notes (Signed)
Pt stated that she has been having flu like symptoms x 2-3 days. Starting early this afternoon she started having left sided chest pressure and pain. The pain radiated to the left side of her neck and sholder blades. She became pale and diaphoretic. No N/V or SOB. Currently, no respiratory distress.  Chest pain is 6 out of 10. Will continue to monitor.

## 2011-08-28 NOTE — ED Provider Notes (Signed)
History    56yF with CP. Recent URI symptoms for past couple days. Today noticed gradual onset CP. Substernal. Pain in mid back as well. Pressure like and worse with coughing. Nonproductive. Denies sob. No unusual leg pain or swelling. No recent travel or prolonged immobilization. No fever or chills.  CSN: [id number]  Arrival date & time 08/28/11  1649   First MD Initiated Contact with Patient 08/28/11 1710      Chief Complaint  Patient presents with  . Chest Pain    (Consider location/radiation/quality/duration/timing/severity/associated sxs/prior treatment) HPI  Past Medical History  Diagnosis Date  . Mitral valve prolapse   . Anxiety   . Depression   . Allergic rhinitis   . Broken ribs     Trauma falling off of horse November 2012  . DVT (deep venous thrombosis)     Past Surgical History  Procedure Date  . Cervical spine surgery   . Right arm surgery   . Lower back surgery   . Cholescystectomy   . Appendectomy   . Tonsillectomy   . Abdominal hysterectomy   . Oophorectomy     Family History  Problem Relation Age of Onset  . Heart attack Brother     CABG  . Hypertrophic cardiomyopathy Son   . Cancer Mother     Uterine  . Cancer Father     brain    History  Substance Use Topics  . Smoking status: Never Smoker   . Smokeless tobacco: Not on file  . Alcohol Use: No    OB History    Grav Para Term Preterm Abortions TAB SAB Ect Mult Living                  Review of Systems   Review of symptoms negative unless otherwise noted in HPI.   Allergies  Amoxicillin; Ampicillin; Aspirin; Cephalexin; Ciprofloxacin; Darvocet; Erythromycin; Fentanyl; Heparin; Hydrocodone; Hydrocodone-acetaminophen; Hyoscyamine sulfate; Imipramine hcl; Indocin; Ketorolac tromethamine; Nortriptyline hcl; Nsaids; Oxycodone; Pentazocine-naloxone hcl; Percocet; Sertraline hcl; Talwin; Toradol; and Tramadol hcl  Home Medications   Current Outpatient Rx  Name Route Sig Dispense  Refill  . ASPIRIN 81 MG PO TABS Oral Take 81 mg by mouth every evening.     Marland Kitchen PROZAC PO Oral Take 60 mg by mouth every morning.     Marland Kitchen NEURONTIN PO Oral Take 900 mg by mouth 2 (two) times daily.     Marland Kitchen NEURONTIN PO Oral Take 1,800 mg by mouth every evening. PT TAKES THIS DOSE ALONG WITH THE OTHER DOSE    . HYDROXYZINE PAMOATE 100 MG PO CAPS Oral Take 100 mg by mouth at bedtime.      Marland Kitchen LORAZEPAM 1 MG PO TABS Oral Take 1 mg by mouth 4 (four) times daily as needed. For anxiety.    . DEMEROL PO Oral Take 1 mg by mouth daily as needed. For pain.    Marland Kitchen METOPROLOL SUCCINATE ER 25 MG PO TB24 Oral Take 25 mg by mouth daily.     Marland Kitchen OMEPRAZOLE 20 MG PO CPDR      . PROCHLORPERAZINE MALEATE 10 MG PO TABS Oral Take 10 mg by mouth 2 (two) times daily.      Marland Kitchen PHENERGAN PO Oral Take 50 mg by mouth as needed. For nausea. Take with Demerol.      BP 66/28  Pulse 69  Temp(Src) 98.7 F (37.1 C) (Oral)  Resp 21  SpO2 98%  Physical Exam  Nursing note and vitals reviewed. Constitutional: She appears  well-developed and well-nourished. No distress.       Laying in bed. No acute distress. Obese  HENT:  Head: Normocephalic and atraumatic.  Eyes: Conjunctivae are normal. Right eye exhibits no discharge. Left eye exhibits no discharge.  Neck: Neck supple.  Cardiovascular: Normal rate, regular rhythm and normal heart sounds.  Exam reveals no gallop and no friction rub.   No murmur heard. Pulmonary/Chest: Effort normal and breath sounds normal. No respiratory distress.  Abdominal: Soft. She exhibits no distension. There is no tenderness.  Musculoskeletal: She exhibits no edema and no tenderness.       Lower extremities symmetric as compared to each other. No calf tenderness. Negative Homan's. No palpable cords.   Neurological: She is alert.  Skin: Skin is warm and dry. She is not diaphoretic.  Psychiatric: She has a normal mood and affect. Her behavior is normal. Thought content normal.    ED Course  Procedures  (including critical care time)  Labs Reviewed  BASIC METABOLIC PANEL - Abnormal; Notable for the following:    Glucose, Bld 105 (*)    GFR calc non Af Amer 69 (*)    GFR calc Af Amer 80 (*)    All other components within normal limits  PRO B NATRIURETIC PEPTIDE - Abnormal; Notable for the following:    Pro B Natriuretic peptide (BNP) 582.1 (*)    All other components within normal limits  CBC  POCT I-STAT TROPONIN I  POCT I-STAT TROPONIN I  POCT I-STAT TROPONIN I   Dg Chest 2 View  08/28/2011  *RADIOLOGY REPORT*  Clinical Data: Chest pain, shortness of breath  CHEST - 2 VIEW  Comparison: 05/24/2011  Findings: Cervical fusion hardware partly visualized.  Costophrenic angles are omitted from the lateral view. Cardiomediastinal silhouette is within normal limits. The lungs are clear. No pleural effusion.  No pneumothorax.  No acute osseous abnormality.  IMPRESSION: No acute cardiopulmonary process.  Original Report Authenticated By: Harrel Lemon, M.D.    EKG:  Rhythm: Normal sinus rhythm Rate: 82 Axis: normal Intervals: Poor R wave progression ST segments: Normal  EKG: repeat Rhythm: Normal sinus rhythm Rate: 72 Axis: Normal Intervals: Normal ST segments: Normal Interval interpretation: No significant change   1. Chest pain     5:51 PM  CXR reviewed. Mediastinum without concerning changes. ASA ordered.  MDM  56yF with CP. Initial EKG and repeat without ischemic changes. Pt with no known CAD.  Had normal cardiac caths in 2005 and 2007. Pain some what atypical with pleuritic nature. Initial trop WNL. CXR with no acute changes. Hypotensive on arrival of unclear etiology. Possibly erroneous. Pt did receive IVF bolus and has remained HD since. Doubt infectious. Doubt cardiogenic shock or other emergent etiology. Will continue to monitor. Pt is a TIMI 1 and has a  HEART score of 3. BMI 33.1. Will r/o and obtain CT coronaries in am.         Raeford Razor, MD 08/28/11  2350

## 2011-08-28 NOTE — ED Provider Notes (Signed)
   Date: 08/28/2011 @ 2032  Rate: 79 Rhythm: normal sinus rhythm  QRS Axis: normal  Intervals: normal  ST/T Wave abnormalities: normal  Conduction Disutrbances:none  Narrative Interpretation: NSR without ectopy or ischemia  Old EKG Reviewed: unchanged  Patient in CDU under chest pain protocol.  Patient scheduled for coronary CT in AM.  Patient resting comfortably at present.  Cardiac markers negative.  12 lead without indication of ischemia.  Jimmye Norman, NP 08/28/11 2316  Jimmye Norman, NP 08/28/11 2317

## 2011-08-28 NOTE — ED Notes (Signed)
Cala Bradford,  [phone]

## 2011-08-28 NOTE — ED Notes (Signed)
Report given to Kayla RN

## 2011-08-28 NOTE — ED Notes (Signed)
PT reports waking up  This  Afternoon with  Cp, " feels  Like an elephant on my chest".  CP is mid sternal and radiates  Under each  Breast. Pt reports she is suppose  To have a stress test  Unsure why she needs it.

## 2011-08-29 ENCOUNTER — Observation Stay (HOSPITAL_COMMUNITY): Payer: BC Managed Care – PPO

## 2011-08-29 ENCOUNTER — Encounter (HOSPITAL_COMMUNITY): Payer: Self-pay | Admitting: Radiology

## 2011-08-29 ENCOUNTER — Other Ambulatory Visit (HOSPITAL_COMMUNITY): Payer: BC Managed Care – PPO

## 2011-08-29 DIAGNOSIS — J189 Pneumonia, unspecified organism: Secondary | ICD-10-CM | POA: Diagnosis present

## 2011-08-29 DIAGNOSIS — R079 Chest pain, unspecified: Secondary | ICD-10-CM | POA: Diagnosis present

## 2011-08-29 DIAGNOSIS — G894 Chronic pain syndrome: Secondary | ICD-10-CM

## 2011-08-29 DIAGNOSIS — F329 Major depressive disorder, single episode, unspecified: Secondary | ICD-10-CM

## 2011-08-29 DIAGNOSIS — F3289 Other specified depressive episodes: Secondary | ICD-10-CM

## 2011-08-29 HISTORY — DX: Pneumonia, unspecified organism: J18.9

## 2011-08-29 LAB — DIFFERENTIAL
Eosinophils Absolute: 0 10*3/uL (ref 0.0–0.7)
Lymphs Abs: 1.3 10*3/uL (ref 0.7–4.0)
Monocytes Absolute: 0.9 10*3/uL (ref 0.1–1.0)
Monocytes Relative: 9 % (ref 3–12)
Neutrophils Relative %: 78 % — ABNORMAL HIGH (ref 43–77)

## 2011-08-29 LAB — CARDIAC PANEL(CRET KIN+CKTOT+MB+TROPI)
Relative Index: 1.5 (ref 0.0–2.5)
Relative Index: 1.6 (ref 0.0–2.5)
Total CK: 236 U/L — ABNORMAL HIGH (ref 7–177)
Total CK: 274 U/L — ABNORMAL HIGH (ref 7–177)
Troponin I: <0.3 ng/mL (ref <0.30)

## 2011-08-29 LAB — CBC
HCT: 35.4 % — ABNORMAL LOW (ref 36.0–46.0)
Hemoglobin: 11.7 g/dL — ABNORMAL LOW (ref 12.0–15.0)
MCH: 28.8 pg (ref 26.0–34.0)
MCV: 87.2 fL (ref 78.0–100.0)
RBC: 4.06 MIL/uL (ref 3.87–5.11)

## 2011-08-29 LAB — EXPECTORATED SPUTUM ASSESSMENT W GRAM STAIN, RFLX TO RESP C

## 2011-08-29 LAB — URINALYSIS, ROUTINE W REFLEX MICROSCOPIC
Bilirubin Urine: NEGATIVE
Glucose, UA: NEGATIVE mg/dL
Hgb urine dipstick: NEGATIVE
Specific Gravity, Urine: 1.006 (ref 1.005–1.030)

## 2011-08-29 MED ORDER — DIPHENHYDRAMINE HCL 50 MG/ML IJ SOLN
50.0000 mg | Freq: Once | INTRAMUSCULAR | Status: AC
  Administered 2011-08-29: 50 mg via INTRAVENOUS

## 2011-08-29 MED ORDER — ACETAMINOPHEN 325 MG PO TABS
650.0000 mg | ORAL_TABLET | Freq: Four times a day (QID) | ORAL | Status: DC | PRN
  Administered 2011-08-29 – 2011-08-30 (×2): 650 mg via ORAL
  Filled 2011-08-29 (×2): qty 2

## 2011-08-29 MED ORDER — GABAPENTIN 300 MG PO CAPS
900.0000 mg | ORAL_CAPSULE | Freq: Two times a day (BID) | ORAL | Status: DC
  Administered 2011-08-29 – 2011-08-30 (×3): 900 mg via ORAL
  Filled 2011-08-29 (×4): qty 3

## 2011-08-29 MED ORDER — GABAPENTIN 600 MG PO TABS
1800.0000 mg | ORAL_TABLET | Freq: Every day | ORAL | Status: DC
  Administered 2011-08-29: 1800 mg via ORAL
  Filled 2011-08-29 (×3): qty 3

## 2011-08-29 MED ORDER — LEVOFLOXACIN 750 MG PO TABS
750.0000 mg | ORAL_TABLET | Freq: Every day | ORAL | Status: DC
  Filled 2011-08-29: qty 1

## 2011-08-29 MED ORDER — SODIUM CHLORIDE 0.9 % IV SOLN
INTRAVENOUS | Status: DC
  Administered 2011-08-29 (×2): via INTRAVENOUS

## 2011-08-29 MED ORDER — PNEUMOCOCCAL VAC POLYVALENT 25 MCG/0.5ML IJ INJ
0.5000 mL | INJECTION | Freq: Once | INTRAMUSCULAR | Status: AC
  Administered 2011-08-29: 0.5 mL via INTRAMUSCULAR
  Filled 2011-08-29: qty 0.5

## 2011-08-29 MED ORDER — METOPROLOL SUCCINATE ER 25 MG PO TB24
25.0000 mg | ORAL_TABLET | Freq: Every day | ORAL | Status: DC
  Administered 2011-08-29 – 2011-08-30 (×2): 25 mg via ORAL
  Filled 2011-08-29 (×2): qty 1

## 2011-08-29 MED ORDER — DIPHENHYDRAMINE HCL 50 MG/ML IJ SOLN
50.0000 mg | Freq: Once | INTRAMUSCULAR | Status: DC
  Filled 2011-08-29 (×2): qty 1

## 2011-08-29 MED ORDER — DOCUSATE SODIUM 100 MG PO CAPS
100.0000 mg | ORAL_CAPSULE | Freq: Two times a day (BID) | ORAL | Status: DC
  Administered 2011-08-29 – 2011-08-30 (×2): 100 mg via ORAL
  Filled 2011-08-29 (×3): qty 1

## 2011-08-29 MED ORDER — MORPHINE SULFATE 4 MG/ML IJ SOLN
4.0000 mg | Freq: Once | INTRAMUSCULAR | Status: AC
  Administered 2011-08-29: 4 mg via INTRAVENOUS
  Filled 2011-08-29: qty 1

## 2011-08-29 MED ORDER — ACETAMINOPHEN 500 MG PO TABS: 1000.0000 mg | ORAL_TABLET | Freq: Once | ORAL | Status: DC

## 2011-08-29 MED ORDER — ONDANSETRON HCL 4 MG/2ML IJ SOLN: 4.0000 mg | INTRAMUSCULAR | Status: DC | PRN

## 2011-08-29 MED ORDER — ACETAMINOPHEN 325 MG PO TABS
975.0000 mg | ORAL_TABLET | Freq: Once | ORAL | Status: AC
  Administered 2011-08-29: 975 mg via ORAL
  Filled 2011-08-29: qty 3

## 2011-08-29 MED ORDER — ASPIRIN EC 81 MG PO TBEC
81.0000 mg | DELAYED_RELEASE_TABLET | Freq: Every evening | ORAL | Status: DC
  Administered 2011-08-29: 81 mg via ORAL
  Filled 2011-08-29 (×3): qty 1

## 2011-08-29 MED ORDER — ENOXAPARIN SODIUM 40 MG/0.4ML ~~LOC~~ SOLN
40.0000 mg | SUBCUTANEOUS | Status: DC
  Administered 2011-08-29: 40 mg via SUBCUTANEOUS
  Filled 2011-08-29 (×2): qty 0.4

## 2011-08-29 MED ORDER — IOHEXOL 350 MG/ML SOLN: 80.0000 mL | Freq: Once | INTRAVENOUS | Status: DC | PRN

## 2011-08-29 MED ORDER — DIPHENHYDRAMINE HCL 50 MG/ML IJ SOLN
25.0000 mg | Freq: Once | INTRAMUSCULAR | Status: AC
  Administered 2011-08-29: 25 mg via INTRAVENOUS
  Filled 2011-08-29: qty 1

## 2011-08-29 MED ORDER — ONDANSETRON HCL 4 MG/2ML IJ SOLN: 4.0000 mg | Freq: Four times a day (QID) | INTRAMUSCULAR | Status: DC | PRN

## 2011-08-29 MED ORDER — LEVOFLOXACIN IN D5W 750 MG/150ML IV SOLN
750.0000 mg | INTRAVENOUS | Status: DC
  Administered 2011-08-29 – 2011-08-30 (×2): 750 mg via INTRAVENOUS
  Filled 2011-08-29 (×2): qty 150

## 2011-08-29 MED ORDER — SODIUM CHLORIDE 0.9 % IV BOLUS (SEPSIS)
1000.0000 mL | Freq: Once | INTRAVENOUS | Status: AC
  Administered 2011-08-29: 1000 mL via INTRAVENOUS

## 2011-08-29 MED ORDER — MORPHINE SULFATE 2 MG/ML IJ SOLN
2.0000 mg | INTRAMUSCULAR | Status: DC | PRN
  Administered 2011-08-29 – 2011-08-30 (×4): 2 mg via INTRAVENOUS
  Filled 2011-08-29: qty 2
  Filled 2011-08-29 (×4): qty 1

## 2011-08-29 MED ORDER — LEVOFLOXACIN 500 MG PO TABS: 500.0000 mg | ORAL_TABLET | Freq: Every day | ORAL | Status: DC

## 2011-08-29 MED ORDER — FLUOXETINE HCL 20 MG PO TABS
60.0000 mg | ORAL_TABLET | Freq: Every day | ORAL | Status: DC
  Administered 2011-08-29 – 2011-08-30 (×2): 60 mg via ORAL
  Filled 2011-08-29 (×2): qty 3

## 2011-08-29 MED ORDER — ALBUTEROL SULFATE HFA 108 (90 BASE) MCG/ACT IN AERS
2.0000 | INHALATION_SPRAY | Freq: Four times a day (QID) | RESPIRATORY_TRACT | Status: DC
  Administered 2011-08-29 – 2011-08-30 (×4): 2 via RESPIRATORY_TRACT
  Filled 2011-08-29 (×3): qty 6.7

## 2011-08-29 MED ORDER — ACETAMINOPHEN 650 MG RE SUPP: 650.0000 mg | Freq: Four times a day (QID) | RECTAL | Status: DC | PRN

## 2011-08-29 MED ORDER — ONDANSETRON HCL 4 MG PO TABS: 4.0000 mg | ORAL_TABLET | Freq: Four times a day (QID) | ORAL | Status: DC | PRN

## 2011-08-29 MED ORDER — SODIUM CHLORIDE 0.9 % IJ SOLN
3.0000 mL | Freq: Two times a day (BID) | INTRAMUSCULAR | Status: DC
  Administered 2011-08-30: 3 mL via INTRAVENOUS

## 2011-08-29 NOTE — ED Provider Notes (Signed)
Medical screening examination/treatment/procedure(s) were conducted as a shared visit with non-physician practitioner(s) and myself.  I personally evaluated the patient during the encounter.  Please see my completed note for this encounter.  Raeford Razor, MD 08/29/11 [phone]

## 2011-08-29 NOTE — Progress Notes (Signed)
08-29-11 UR review completed. Ronny Flurry RN BSN
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
## 2011-08-29 NOTE — ED Provider Notes (Signed)
Patient seen and examined. Patient's described symptoms of fever at home and sick exposures. Patient also has a history of DVT. Patient symptoms likely from infectious etiology and not ACS. Cardiac markers x2 negative. Awaiting chest CT  Toy Baker, MD 08/29/11 [phone]

## 2011-08-29 NOTE — ED Provider Notes (Signed)
8:00 AM Patient with a hx sig for mitral valve prolapse and DVT was placed in CDU on chest pain protocol by Dr. Juleen China. Patient care resumed from the blue side and Dr. Freida Busman .  Patient is here for CT coronary and has received 2 L bolus, pain medications and aspirin. While in obeservation over night the pt slept well, but developed a worsening cough as well as a fever, per nursing staff.  Patient has been given Tylenol. Patient re-evaluated and is resting comfortable, VSS, but my concern at this time is that patient's etiology of chest pain and symptoms is an infectious process rather than cardiac. Plan per previous provider was for the patient to receive a coronary CT however her after evaluating patient they do not fit the criteria do to BMI of 35.  This patient has been discussed with Dr. Freida Busman who agrees that it appears the patient's etiology is likely infectious.  CT angios chest will be done to rule out possible pneumonia versus pulmonary embolus. On exam: hemodynamically stable, NAD, heart w/ RRR, lungs with light rhonchi bilaterally and expiratory wheezing, Chest & abd non-tender, no peripheral edema or calf tenderness.  9:30 AM Patient has been given levofloxacin (discussed with daughter which antibiotics she has not had an allergic reaction to), liter bolus, blood cultures x2, lactic acid and Tylenol.    CT angio IMPRESSION:  1. Negative for pulmonary embolism to the level of the bilateral subsegmental pulmonary arteries. 2. Scattered ill-defined bilateral areas of ground glass, worse within the superior segment of the right upper lobe is nonspecific though most suggestive of infection, including atypicals.  Original Report Authenticated By: Waynard Reeds, M.D.        10:13 AM Pt re-evaluated and does not appear to be improving.  Patient still has expiratory wheezing on auscultation bilaterally, refuses nebulizer treatment, patient will be given albuterol inhaler and said due to  patient's adverse reaction to nebulizers.  Plan is to get patient admitted for further IV treatment and observation for atypical pneumonia  10:24 AM Pt to be admitted InPt PNA Dr. Rogelia Boga, Telemetry bed   [place], [place] 08/29/11 1025

## 2011-08-29 NOTE — ED Notes (Signed)
resp therapist at bedside to teach puffers to pt

## 2011-08-29 NOTE — ED Provider Notes (Signed)
Patient spiked fever over 102 last night. Her chest x-ray was repeated and was normal. She started complaining of chest pain again and her morphine with Benadryl was repeated. Patient has multiple medication allergies. She was given Tylenol around midnight however she still had fever at 3:30. We were unable to give her anything else for fever because she is allergic to all nonsteroidal anti-inflammatory agents.   05:28  Date: 08/29/2011  Rate: 85  Rhythm: normal sinus rhythm  QRS Axis: normal  Intervals: normal  ST/T Wave abnormalities: normal  Conduction Disutrbances:none  Narrative Interpretation:   Old EKG Reviewed: none available  Pt waiting to get CTA this am.   Devoria Albe, MD, Franz Dell, MD 08/29/11 [phone]

## 2011-08-29 NOTE — ED Notes (Signed)
Dr Lynelle Doctor notified of TEmp 102.2, and Pain radiating down bilateral neck, across chest, rated 5/10.  Pt requesting pain meds.  Cardiac monitor ST 103

## 2011-08-30 LAB — COMPREHENSIVE METABOLIC PANEL
ALT: 185 U/L — ABNORMAL HIGH (ref 0–35)
Alkaline Phosphatase: 132 U/L — ABNORMAL HIGH (ref 39–117)
BUN: 5 mg/dL — ABNORMAL LOW (ref 6–23)
CO2: 25 mEq/L (ref 19–32)
Calcium: 8.1 mg/dL — ABNORMAL LOW (ref 8.4–10.5)
Creatinine, Ser: 0.7 mg/dL (ref 0.50–1.10)
GFR calc Af Amer: >90 mL/min (ref >90)
GFR calc non Af Amer: >90 mL/min (ref >90)
Glucose, Bld: 98 mg/dL (ref 70–99)
Potassium: 3.1 mEq/L — ABNORMAL LOW (ref 3.5–5.1)
Sodium: 139 mEq/L (ref 135–145)
Total Protein: 5.5 g/dL — ABNORMAL LOW (ref 6.0–8.3)

## 2011-08-30 LAB — CARDIAC PANEL(CRET KIN+CKTOT+MB+TROPI)
CK, MB: 4.5 ng/mL — ABNORMAL HIGH (ref 0.3–4.0)
CK, MB: 4.8 ng/mL — ABNORMAL HIGH (ref 0.3–4.0)
CK, MB: 4.2 ng/mL — ABNORMAL HIGH (ref 0.3–4.0)
Relative Index: 1.5 (ref 0.0–2.5)
Relative Index: 1.7 (ref 0.0–2.5)
Total CK: 295 U/L — ABNORMAL HIGH (ref 7–177)
Total CK: 257 U/L — ABNORMAL HIGH (ref 7–177)
Total CK: 252 U/L — ABNORMAL HIGH (ref 7–177)
Troponin I: <0.3 ng/mL (ref <0.30)
Troponin I: <0.3 ng/mL (ref <0.30)

## 2011-08-30 LAB — URINE CULTURE
Colony Count: NO GROWTH
Culture  Setup Time: 201304231408
Culture: NO GROWTH

## 2011-08-30 LAB — CBC
HCT: 31 % — ABNORMAL LOW (ref 36.0–46.0)
Hemoglobin: 10.4 g/dL — ABNORMAL LOW (ref 12.0–15.0)
MCHC: 33.5 g/dL (ref 30.0–36.0)
Platelets: 193 10*3/uL (ref 150–400)
RBC: 3.53 MIL/uL — ABNORMAL LOW (ref 3.87–5.11)
RDW: 13.4 % (ref 11.5–15.5)
WBC: 8.6 10*3/uL (ref 4.0–10.5)

## 2011-08-30 LAB — GLUCOSE, CAPILLARY: Glucose-Capillary: 111 mg/dL — ABNORMAL HIGH (ref 70–99)

## 2011-08-30 LAB — LEGIONELLA ANTIGEN, URINE: Legionella Antigen, Urine: NEGATIVE

## 2011-08-30 MED ORDER — LEVOFLOXACIN 500 MG PO TABS: 500.0000 mg | ORAL_TABLET | Freq: Every day | ORAL | Status: AC

## 2011-08-30 MED ORDER — ALBUTEROL SULFATE HFA 108 (90 BASE) MCG/ACT IN AERS: 2.0000 | INHALATION_SPRAY | Freq: Four times a day (QID) | RESPIRATORY_TRACT | Status: AC | PRN

## 2011-08-30 MED ORDER — POTASSIUM CHLORIDE CRYS ER 20 MEQ PO TBCR
40.0000 meq | EXTENDED_RELEASE_TABLET | ORAL | Status: AC
  Administered 2011-08-30 (×2): 40 meq via ORAL
  Filled 2011-08-30 (×2): qty 2

## 2011-08-30 MED ORDER — ACETAMINOPHEN 325 MG PO TABS: 650.0000 mg | ORAL_TABLET | Freq: Four times a day (QID) | ORAL | Status: AC | PRN

## 2011-08-30 MED ORDER — DIPHENHYDRAMINE HCL 25 MG PO CAPS
25.0000 mg | ORAL_CAPSULE | Freq: Four times a day (QID) | ORAL | Status: DC | PRN
  Administered 2011-08-30: 25 mg via ORAL
  Filled 2011-08-30: qty 1

## 2011-08-30 NOTE — Progress Notes (Signed)
DC orders received. Pt stable no s/s of distress. Reviewed dc instructions and medications. Pt stated understanding. Dc home

## 2011-08-30 NOTE — Progress Notes (Signed)
Pt c/o shoulder pain and upper chest pain that was not relieved with 2mg . Morphine. MD notified and advised to give second dose of 2mg  of morphine. Will continue to monitor.

## 2011-08-30 NOTE — H&P (Signed)
Internal Medicine Teaching Service Attending Note Date: 08/30/2011  Patient name: [name]  Medical record number: [id number]  Date of birth: 01/03/1956   I have seen and evaluated [name] and discussed their care with the Residency Team. Please see Dr Thane Edu H&P for full details. Pt came to the ER with atypical CP and was watched overnight in the CDU. She has had 5 negative Trop I although her CK/MB are slightly elevated. Her EKG had no acute ischemic changes. While in the CDU her BP dropped to 66/28 (responded to IVF) and her temp increased to 103 and she was symptomatic. CT was obtained and it showed ground glass changes in the RUL. She has since been started on ABX and per EPIC has remained afebrile although pt states she was febrile last PM and also hypoxic to the low 80's (also not documented). Pt still c/o cough and feeling tired.   She has had CAP previously and was treated with outpt IV Levaquin an IVF.  On exam, she was standing and walking independently. She was in NAD and able to speak in full sentences without resp distress. She had no cough except when I was ausculting.  LCTAB but there were upper airway noises HRRR ABD benign Neuro : ambulate normal, no focal  Assessment and Plan: I agree with the formulated Assessment and Plan with the following changes:  1. CP - Atypical. CE and EKG negative. Cath 2007 normal. No further W/U indicated. 2. CAP - No leukocytosis. Agree with empiric ABX - Levaquin. Follow cx results. Pt had documented temp this AM of 101.5. She is young with good cardiopul reserve and the risks of D/C today is less than the risks of neong hospitalized without sig reason. She also received pneumovax that can cause a febrile rxn.  3. Chronic pain syndrome - cont home meds and F/U as outpt 4. Chronic anxiety - F/U as out pt. 5. Hypotension - resolved with IVF. Denies steroid intake. No life treatening cause ID'd - no pericarditis, no aortic  dissection, No PE. Likely 2/2 infx that is being tx.

## 2011-08-30 NOTE — Progress Notes (Signed)
Subjective:  Productive cough continues but she is clearing her sputum better per patient.  Recorded vitals show patient afebrile for over 24 hrs.  No shortness of breath.     Objective: Vital signs in last 24 hours: Filed Vitals:   08/30/11 0022 08/30/11 0500 08/30/11 0852 08/30/11 1052  BP:  112/80  118/68  Pulse:  82  90  Temp: 99.8 F (37.7 C) 99.2 F (37.3 C)    TempSrc: Oral Oral    Resp:  20    Height:      Weight:      SpO2:  94% 100%    Physical Exam:  General:  Vital signs reviewed and noted. Well-developed, well-nourished, in no acute distress; alert, appropriate and cooperative throughout examination.   Head:  Normocephalic, atraumatic.   Eyes:  PERRL, EOMI, No signs of anemia or jaundince.   Neck:  no JVD  Lungs:  Normal respiratory effort. Mild diffuse wheezing, upper airway noise, productive cough  Heart:  RRR. S1 and S2 normal without gallop, murmur, or rubs.   Extremities:  No pretibial edema.   Skin:  No visible rashes, scars.     Lab Results: Basic Metabolic Panel:  Lab 08/30/11 4098 08/28/11 1722  NA 139 136  K 3.1* 3.9  CL 106 101  CO2 25 23  GLUCOSE 98 105*  BUN 5* 10  CREATININE 0.70 0.92  CALCIUM 8.1* 8.8  MG -- --  PHOS -- --   Liver Function Tests:  Lab 08/30/11 0710  AST 119*  ALT 185*  ALKPHOS 132*  BILITOT 0.3  PROT 5.5*  ALBUMIN 2.9*   CBC:  Lab 08/30/11 0710 08/29/11 0945  WBC 8.6 9.7  NEUTROABS -- 7.6  HGB 10.4* 11.7*  HCT 31.0* 35.4*  MCV 87.8 87.2  PLT 193 217   Cardiac Enzymes:  Lab 08/30/11 0710 08/30/11 0045 08/29/11 1905  CKTOTAL 257* 295* 274*  CKMB 4.8* 4.5* 4.5*  CKMBINDEX -- -- --  TROPONINI <0.30 <0.30 <0.30   BNP:  Lab 08/28/11 1700  PROBNP 582.1*   CBG:  Lab 08/30/11 0745  GLUCAP 111*   Urinalysis:  Lab 08/29/11 0827  COLORURINE YELLOW  LABSPEC 1.006  PHURINE 6.0  GLUCOSEU NEGATIVE  HGBUR NEGATIVE  BILIRUBINUR NEGATIVE  KETONESUR NEGATIVE  PROTEINUR NEGATIVE    UROBILINOGEN 1.0  NITRITE NEGATIVE  LEUKOCYTESUR TRACE*   Micro Results: Recent Results (from the past 240 hour(s))  URINE CULTURE     Status: Normal   Collection Time   08/29/11  8:27 AM      Component Value Range Status Comment   Specimen Description URINE, CLEAN CATCH   Final    Special Requests NONE   Final    Culture  Setup Time [id number]   Final    Colony Count NO GROWTH   Final    Culture NO GROWTH   Final    Report Status 08/30/2011 FINAL   Final   CULTURE, BLOOD (ROUTINE X 2)     Status: Normal (Preliminary result)   Collection Time   08/29/11  9:40 AM      Component Value Range Status Comment   Specimen Description BLOOD LEFT ARM   Final    Special Requests BOTTLES DRAWN AEROBIC AND ANAEROBIC 10CC   Final    Culture  Setup Time [id number]   Final    Culture     Final    Value:        BLOOD CULTURE RECEIVED NO GROWTH TO  DATE CULTURE WILL BE HELD FOR 5 DAYS BEFORE ISSUING A FINAL NEGATIVE REPORT   Report Status PENDING   Incomplete   CULTURE, BLOOD (ROUTINE X 2)     Status: Normal (Preliminary result)   Collection Time   08/29/11  9:50 AM      Component Value Range Status Comment   Specimen Description BLOOD LEFT ARM   Final    Special Requests BOTTLES DRAWN AEROBIC AND ANAEROBIC 10CC   Final    Culture  Setup Time [id number]   Final    Culture     Final    Value:        BLOOD CULTURE RECEIVED NO GROWTH TO DATE CULTURE WILL BE HELD FOR 5 DAYS BEFORE ISSUING A FINAL NEGATIVE REPORT   Report Status PENDING   Incomplete   CULTURE, SPUTUM-ASSESSMENT     Status: Normal   Collection Time   08/29/11  4:32 PM      Component Value Range Status Comment   Specimen Description SPUTUM   Final    Special Requests NONE   Final    Sputum evaluation     Final    Value: THIS SPECIMEN IS ACCEPTABLE. RESPIRATORY CULTURE REPORT TO FOLLOW.   Report Status 08/29/2011 FINAL   Final   CULTURE, RESPIRATORY     Status: Normal (Preliminary result)   Collection Time   08/29/11  4:32 PM       Component Value Range Status Comment   Specimen Description SPUTUM   Final    Special Requests NONE   Final    Gram Stain     Final    Value: ABUNDANT WBC PRESENT,BOTH PMN AND MONONUCLEAR     NO SQUAMOUS EPITHELIAL CELLS SEEN     RARE GRAM POSITIVE COCCI IN PAIRS   Culture NO GROWTH   Final    Report Status PENDING   Incomplete    Studies/Results: Dg Chest 2 View  08/28/2011  *RADIOLOGY REPORT*  Clinical Data: Chest pain, shortness of breath  CHEST - 2 VIEW  Comparison: 05/24/2011  Findings: Cervical fusion hardware partly visualized.  Costophrenic angles are omitted from the lateral view. Cardiomediastinal silhouette is within normal limits. The lungs are clear. No pleural effusion.  No pneumothorax.  No acute osseous abnormality.  IMPRESSION: No acute cardiopulmonary process.  Original Report Authenticated By: Harrel Lemon, M.D.   Ct Angio Chest W/cm &/or Wo Cm  08/29/2011  *RADIOLOGY REPORT*  Clinical Data:  Left-sided chest pain, shortness of breath, history of DVT, evaluate for infection and/or pulmonary embolism.  CT ANGIOGRAPHY CHEST WITH CONTRAST  Technique:  Multidetector CT imaging of the chest was performed using the standard protocol during bolus administration of intravenous contrast.  Multiplanar CT image reconstructions including MIPs were obtained to evaluate the vascular anatomy.  Contrast:  80 ml Omnipaque 350  Comparison:  Chest CT - 03/12/2011; chest radiograph - 08/29/2011  Vascular Findings:  There is adequate opacification of the pulmonary arteries of the main pulmonary artery measuring 241 HU there is no discrete filling defect within the main or segmental branches of the pulmonary artery to suggest acute pulmonary embolism.  Evaluation of the distal subsegmental pulmonary arteries is degraded secondary to suboptimal vessel opacification.  The caliber of the main pulmonary artery is normal.  Normal heart size.  No pericardial effusion.  Normal caliber thoracic aorta.   No thoracic aortic dissection.  No periaortic stranding.  Review of the MIP images confirms the above findings.  Nonvascular findings:  Interval development of small ill-defined regions of ground-glass primarily within the superior segment of the right lower lobe (images 48 and 49) as well as within the right upper (image 29) and left lower lobes (image 45).  Minimal dependent linear heterogeneous opacities within the dependent portion of the right lower lobe favored to represent subsegmental atelectasis.  No pleural effusion or pneumothorax.  Central airways are patent.  No mediastinal, hilar or axillary lymphadenopathy.  Early arterial phase evaluation of the upper abdomen demonstrates unchanged mild prominence of the common bile duct measuring 1 cm in diameter (image 95, series 4) likely the sequela of postcholecystectomy state.  No acute or aggressive osseous abnormalities.  Unchanged mild (25%) anterior compression deformity of the T8 vertebral body.  Post lower cervical ACDF, incompletely imaged.  Old, healed left-sided posterior 9th through 11th rib fractures.  IMPRESSION:  1.  Negative for pulmonary embolism to the level of the bilateral subsegmental pulmonary arteries. 2.  Scattered ill-defined bilateral areas of ground glass, worse within the superior segment of the right upper lobe is nonspecific though most suggestive of infection, including atypicals.  Original Report Authenticated By: Waynard Reeds, M.D.   Dg Chest Port 1 View  08/29/2011  *RADIOLOGY REPORT*  Clinical Data: Chest pain  PORTABLE CHEST - 1 VIEW  Comparison: 08/28/2011  Findings: Shallow inspiration.  Normal heart size and pulmonary vascularity.  No focal airspace consolidation in the lungs.  No blunting of costophrenic angles.  No pneumothorax.  Postoperative changes in the cervical spine.  No significant change since previous study.  IMPRESSION: No evidence of active pulmonary disease.  Original Report Authenticated By: Marlon Pel, M.D.   Medications: I have reviewed the patient's current medications. Scheduled Meds:   . albuterol  2 puff Inhalation Q6H  . aspirin EC  81 mg Oral QPM  . docusate sodium  100 mg Oral BID  . enoxaparin  40 mg Subcutaneous Q24H  . FLUoxetine  60 mg Oral Daily  . gabapentin  900 mg Oral BID WC  . gabapentin  1,800 mg Oral QHS  . levofloxacin (LEVAQUIN) IV  750 mg Intravenous Q24H  . metoprolol succinate  25 mg Oral Daily  . pneumococcal 23 valent vaccine  0.5 mL Intramuscular Once  . potassium chloride  40 mEq Oral Q4H  . sodium chloride  3 mL Intravenous Q12H  . DISCONTD: diphenhydrAMINE  50 mg Intramuscular Once   Continuous Infusions:   . DISCONTD: sodium chloride Stopped (08/29/11 0736)  . DISCONTD: sodium chloride 125 mL/hr at 08/29/11 2234   PRN Meds:.acetaminophen, acetaminophen, diphenhydrAMINE, LORazepam, morphine injection, ondansetron, ondansetron (ZOFRAN) IV, ondansetron, DISCONTD: iohexol   Assessment/Plan:  This is a 56 year old woman with history of chronic pain, depression and anxiety who has been admitted for community acquired pneumonia.  Chest pain - most likely secondary to atypical pneumonia as she has been ruled out with three negative point of care cardiac markers, repeat EKG without ST changes, and resolution of chest pain. Hypotension and fevers resolved.  CT chest positive for infectious process. CT angio neg for PE. Pt was hypotensive on presentation but responded well to fluids and lactic acid within normal limits.   Plan:  -Discharge today on 5 more days of levaquin for 7 days total -Tylenol PRN for fevers -Continue pain management per PCP -Patient may use albuterol 2-3 times per day for next 4 days to help improve cough  Chronic pain syndrome and fibromyalgia - continue home dose of Gabapentin  Depression - currently stable, denies S/I. Will continue Prozac at home dose    LOS: 2 days   Yaakov Guthrie, BRAD 08/30/2011, 11:59 AM

## 2011-08-30 NOTE — ED Provider Notes (Signed)
Medical screening examination/treatment/procedure(s) were conducted as a shared visit with non-physician practitioner(s) and myself.  I personally evaluated the patient during the encounter  Toy Baker, MD 08/30/11 [phone]

## 2011-09-01 LAB — CULTURE, RESPIRATORY W GRAM STAIN
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
## 2011-09-04 LAB — CULTURE, BLOOD (ROUTINE X 2)
Culture  Setup Time: 201304231438
Culture: NO GROWTH

## 2011-10-30 ENCOUNTER — Other Ambulatory Visit (HOSPITAL_COMMUNITY): Payer: Self-pay | Admitting: Family Medicine

## 2011-10-30 ENCOUNTER — Ambulatory Visit (HOSPITAL_COMMUNITY)
Admission: RE | Admit: 2011-10-30 | Discharge: 2011-10-30 | Disposition: A | Discharge status: Home / Self Care | Payer: BC Managed Care – PPO | Source: Ambulatory Visit | Attending: Family Medicine | Admitting: Family Medicine

## 2011-10-30 DIAGNOSIS — M79671 Pain in right foot: Secondary | ICD-10-CM

## 2011-10-30 DIAGNOSIS — M25579 Pain in unspecified ankle and joints of unspecified foot: Secondary | ICD-10-CM | POA: Insufficient documentation

## 2013-05-02 ENCOUNTER — Encounter (HOSPITAL_COMMUNITY): Payer: Self-pay | Admitting: Emergency Medicine

## 2013-05-02 ENCOUNTER — Emergency Department (HOSPITAL_COMMUNITY)
Admission: EM | Admit: 2013-05-02 | Discharge: 2013-05-02 | Disposition: A | Discharge status: Home / Self Care | Payer: Medicare Other | Attending: Emergency Medicine | Admitting: Emergency Medicine

## 2013-05-02 DIAGNOSIS — F329 Major depressive disorder, single episode, unspecified: Secondary | ICD-10-CM | POA: Insufficient documentation

## 2013-05-02 DIAGNOSIS — Z8701 Personal history of pneumonia (recurrent): Secondary | ICD-10-CM | POA: Insufficient documentation

## 2013-05-02 DIAGNOSIS — Z87828 Personal history of other (healed) physical injury and trauma: Secondary | ICD-10-CM | POA: Insufficient documentation

## 2013-05-02 DIAGNOSIS — Y9389 Activity, other specified: Secondary | ICD-10-CM | POA: Insufficient documentation

## 2013-05-02 DIAGNOSIS — Z8739 Personal history of other diseases of the musculoskeletal system and connective tissue: Secondary | ICD-10-CM | POA: Insufficient documentation

## 2013-05-02 DIAGNOSIS — Z23 Encounter for immunization: Secondary | ICD-10-CM | POA: Insufficient documentation

## 2013-05-02 DIAGNOSIS — K219 Gastro-esophageal reflux disease without esophagitis: Secondary | ICD-10-CM | POA: Insufficient documentation

## 2013-05-02 DIAGNOSIS — Z86718 Personal history of other venous thrombosis and embolism: Secondary | ICD-10-CM | POA: Insufficient documentation

## 2013-05-02 DIAGNOSIS — S61412A Laceration without foreign body of left hand, initial encounter: Secondary | ICD-10-CM

## 2013-05-02 DIAGNOSIS — F3289 Other specified depressive episodes: Secondary | ICD-10-CM | POA: Insufficient documentation

## 2013-05-02 DIAGNOSIS — W260XXA Contact with knife, initial encounter: Secondary | ICD-10-CM | POA: Insufficient documentation

## 2013-05-02 DIAGNOSIS — J45901 Unspecified asthma with (acute) exacerbation: Secondary | ICD-10-CM | POA: Insufficient documentation

## 2013-05-02 DIAGNOSIS — G40909 Epilepsy, unspecified, not intractable, without status epilepticus: Secondary | ICD-10-CM | POA: Insufficient documentation

## 2013-05-02 DIAGNOSIS — Z7982 Long term (current) use of aspirin: Secondary | ICD-10-CM | POA: Insufficient documentation

## 2013-05-02 DIAGNOSIS — S61409A Unspecified open wound of unspecified hand, initial encounter: Secondary | ICD-10-CM | POA: Insufficient documentation

## 2013-05-02 DIAGNOSIS — G894 Chronic pain syndrome: Secondary | ICD-10-CM | POA: Insufficient documentation

## 2013-05-02 DIAGNOSIS — Z8673 Personal history of transient ischemic attack (TIA), and cerebral infarction without residual deficits: Secondary | ICD-10-CM | POA: Insufficient documentation

## 2013-05-02 DIAGNOSIS — F411 Generalized anxiety disorder: Secondary | ICD-10-CM | POA: Insufficient documentation

## 2013-05-02 DIAGNOSIS — Z79899 Other long term (current) drug therapy: Secondary | ICD-10-CM | POA: Insufficient documentation

## 2013-05-02 DIAGNOSIS — Y929 Unspecified place or not applicable: Secondary | ICD-10-CM | POA: Insufficient documentation

## 2013-05-02 MED ORDER — ONDANSETRON HCL 4 MG/2ML IJ SOLN
4.0000 mg | Freq: Once | INTRAMUSCULAR | Status: AC
  Administered 2013-05-02: 4 mg via INTRAVENOUS
  Filled 2013-05-02: qty 2

## 2013-05-02 MED ORDER — ONDANSETRON HCL 4 MG/2ML IJ SOLN
4.0000 mg | Freq: Once | INTRAMUSCULAR | Status: AC
  Administered 2013-05-02: 4 mg via INTRAVENOUS

## 2013-05-02 MED ORDER — TETANUS-DIPHTH-ACELL PERTUSSIS 5-2.5-18.5 LF-MCG/0.5 IM SUSP
0.5000 mL | Freq: Once | INTRAMUSCULAR | Status: AC
  Administered 2013-05-02: 0.5 mL via INTRAMUSCULAR
  Filled 2013-05-02: qty 0.5

## 2013-05-02 MED ORDER — MORPHINE SULFATE 4 MG/ML IJ SOLN
4.0000 mg | Freq: Once | INTRAMUSCULAR | Status: AC
  Administered 2013-05-02: 4 mg via INTRAVENOUS
  Filled 2013-05-02: qty 1

## 2013-05-02 NOTE — ED Provider Notes (Signed)
CSN: [id number]     Arrival date & time 05/02/13  1307 History   First MD Initiated Contact with Patient 05/02/13 1332     Chief Complaint  Patient presents with  . Extremity Laceration    PT reports trying to open plastic container with kitchen knife and accidentally cutting her wrist. laceration entry lateral L wrist and exit lateral R wrist   (Consider location/radiation/quality/duration/timing/severity/associated sxs/prior Treatment) HPI Patient reports she was attempting to open a plastic package with a sharp kitchen knife when the knife accidentally slipped and cut her left wrist.  States the knife stuck in through her wrist. Reports numbness of the left thumb and pain with movement of the thumb.  Denies any other injury.  Denies weakness or numbness of any other fingers. Denies possibility of FB.  Unsure of last tetanus vx.   Past Medical History  Diagnosis Date  . Mitral valve prolapse   . Anxiety   . Depression     history of prior suicide attempts  . Allergic rhinitis   . Broken ribs     Trauma falling off of horse November 2012  . DVT (deep venous thrombosis)     remote at age 36  . Herniated nucleus pulposus, L4-5 left 2001    s/p Left microsurgical exploration L4-5 and microdiskectomy with lysis  . Hypotension     history of hypotension in the past requiring fluid boluses  . Chronic pain syndrome   . CVA (cerebral infarction)     reported by patient, no documentation  . Atypical chest pain     cath 2007 showing normal coronary arteries, last echo 2003 with normal EF and no systolic dysfunction  . Chest pain   . Stroke     hx of TIA  . Asthma   . Shortness of breath   . Pneumonia 08/29/2011  . GERD (gastroesophageal reflux disease)   . Seizures    Past Surgical History  Procedure Laterality Date  . Cervical spine surgery    . Right arm surgery    . Lower back surgery    . Cholescystectomy  2003     Laparoscopic cholecystectomy with intraoperative  .  Appendectomy    . Tonsillectomy    . Abdominal hysterectomy    . Oophorectomy     Family History  Problem Relation Age of Onset  . Heart attack Brother     CABG  . Hypertrophic cardiomyopathy Son   . Cancer Mother     Uterine  . Cancer Father     brain   History  Substance Use Topics  . Smoking status: Never Smoker   . Smokeless tobacco: Never Used  . Alcohol Use: No   OB History   Grav Para Term Preterm Abortions TAB SAB Ect Mult Living                 Review of Systems  Skin: Positive for wound. Negative for color change.  Allergic/Immunologic: Negative for immunocompromised state.  Neurological: Negative for weakness and numbness.    Allergies  Amoxicillin; Ampicillin; Aspirin; Cephalexin; Ciprofloxacin; Darvocet; Erythromycin; Fentanyl; Heparin; Hydrocodone; Hydrocodone-acetaminophen; Hyoscyamine sulfate; Imipramine hcl; Indomethacin; Ketorolac tromethamine; Ketorolac tromethamine; Nortriptyline hcl; Nsaids; Oxycodone; Pentazocine lactate; Pentazocine-naloxone; Percocet; Sertraline hcl; and Tramadol hcl  Home Medications   Current Outpatient Rx  Name  Route  Sig  Dispense  Refill  . aspirin EC 81 MG tablet   Oral   Take 81 mg by mouth every evening.         Marland Kitchen  cetirizine (ZYRTEC) 10 MG tablet   Oral   Take 10 mg by mouth daily.         Marland Kitchen FLUoxetine (PROZAC) 20 MG tablet   Oral   Take 60 mg by mouth every morning.         . gabapentin (NEURONTIN) 300 MG capsule   Oral   Take 900-1,800 mg by mouth 3 (three) times daily. Take 900 MG twice a day, and take 1800 MG at bedtime.         . hydrOXYzine (VISTARIL) 25 MG capsule   Oral   Take 1 capsule by mouth 3 (three) times daily.         Marland Kitchen LORazepam (ATIVAN) 1 MG tablet   Oral   Take 1 mg by mouth 4 (four) times daily as needed. For anxiety.         . meperidine (DEMEROL) 50 MG tablet   Oral   Take 50 mg by mouth every 6 (six) hours as needed. For pain.         . metoprolol succinate  (TOPROL-XL) 25 MG 24 hr tablet   Oral   Take 25 mg by mouth daily.          Marland Kitchen omeprazole (PRILOSEC) 20 MG capsule   Oral   Take 1 capsule by mouth 2 (two) times daily.         . promethazine (PHENERGAN) 25 MG tablet   Oral   Take 25 mg by mouth every 6 (six) hours as needed. For nausea. Takes with Demerol.         Marland Kitchen EXPIRED: albuterol (PROVENTIL HFA;VENTOLIN HFA) 108 (90 BASE) MCG/ACT inhaler   Inhalation   Inhale 2 puffs into the lungs every 6 (six) hours as needed for wheezing.   1 Inhaler   0    BP 113/79  Pulse 70  Temp(Src) 98.5 F (36.9 C) (Oral)  Resp 18  Ht 5\' 4"  (1.626 m)  Wt 182 lb (82.555 kg)  BMI 31.22 kg/m2 Physical Exam  Nursing note and vitals reviewed. Constitutional: She appears well-developed and well-nourished. No distress.  HENT:  Head: Normocephalic and atraumatic.  Neck: Neck supple.  Pulmonary/Chest: Effort normal.  Musculoskeletal:       Hands: Left thumb will full AROM but decreased strength, decreased sensation throughout entire thumb.  Other digits with full AROM and sensation.  Capillary refill < 2 seconds throughout.    Neurological: She is alert.  Skin: She is not diaphoretic.    ED Course  Procedures (including critical care time) Labs Review Labs Reviewed - No data to display Imaging Review No results found.  EKG Interpretation   None      LACERATION REPAIR Performed by: Trixie Dredge Authorized by: Trixie Dredge Consent: Verbal consent obtained. Risks and benefits: risks, benefits and alternatives were discussed Consent given by: patient Patient identity confirmed: provided demographic data Prepped and Draped in normal sterile fashion Wound explored  Laceration Location: left palm  Laceration Length: 3.4 cm  No Foreign Bodies seen or palpated  Anesthesia: local infiltration  Local anesthetic: lidocaine 2% no epinephrine  Anesthetic total: 7 ml  Irrigation method: syringe Amount of cleaning: standard  Skin  closure: 4-0 ethilon  Number of sutures: 9  Technique: simple interrupted  Patient tolerance: Patient tolerated the procedure well with no immediate complications.   MDM   1. Hand laceration, left, initial encounter    Patient with accidental laceration to the left palm.  Pt has full AROM  of thumb may be weak but appears to be secondary to pain.  Strength testing of the thumb is equivalent to the other fingers.  Sensation decreased.  Laceration repaired in ED.  D/C home with hand surgery follow up - Dr Merlyn Lot.  Tetanus updated. Discussed  findings, treatment, and follow up  with patient.  Pt given return precautions.  Pt verbalizes understanding and agrees with plan.        Trixie Dredge, PA-C 05/02/13 1513

## 2013-05-02 NOTE — ED Notes (Signed)
Wound cleaned and dressing placed. Pt educated about how to clean wound, wrap wound, and when to come back to remove stitches.

## 2013-05-02 NOTE — ED Provider Notes (Signed)
Medical screening examination/treatment/procedure(s) were performed by non-physician practitioner and as supervising physician I was immediately available for consultation/collaboration.  EKG Interpretation   None         Layla Maw Mylene Bow, DO 05/02/13 1541

## 2013-05-02 NOTE — ED Notes (Signed)
PT trying to open plastic container and accidentally cut through her L wrist. Entry and exit wounds reported

## 2013-05-09 ENCOUNTER — Encounter (HOSPITAL_BASED_OUTPATIENT_CLINIC_OR_DEPARTMENT_OTHER): Payer: Self-pay | Admitting: *Deleted

## 2013-05-09 ENCOUNTER — Ambulatory Visit (HOSPITAL_BASED_OUTPATIENT_CLINIC_OR_DEPARTMENT_OTHER)
Admission: RE | Admit: 2013-05-09 | Discharge: 2013-05-09 | Disposition: A | Discharge status: Home / Self Care | Payer: Medicare Other | Source: Ambulatory Visit | Attending: Orthopedic Surgery | Admitting: Orthopedic Surgery

## 2013-05-09 ENCOUNTER — Encounter (HOSPITAL_BASED_OUTPATIENT_CLINIC_OR_DEPARTMENT_OTHER): Payer: Medicare Other | Admitting: Certified Registered"

## 2013-05-09 ENCOUNTER — Ambulatory Visit (HOSPITAL_BASED_OUTPATIENT_CLINIC_OR_DEPARTMENT_OTHER): Payer: Medicare Other | Admitting: Certified Registered"

## 2013-05-09 ENCOUNTER — Encounter (HOSPITAL_BASED_OUTPATIENT_CLINIC_OR_DEPARTMENT_OTHER)
Admission: RE | Disposition: A | Discharge status: Home / Self Care | Payer: Self-pay | Source: Ambulatory Visit | Attending: Orthopedic Surgery

## 2013-05-09 DIAGNOSIS — I059 Rheumatic mitral valve disease, unspecified: Secondary | ICD-10-CM | POA: Insufficient documentation

## 2013-05-09 DIAGNOSIS — F3289 Other specified depressive episodes: Secondary | ICD-10-CM | POA: Insufficient documentation

## 2013-05-09 DIAGNOSIS — I959 Hypotension, unspecified: Secondary | ICD-10-CM | POA: Insufficient documentation

## 2013-05-09 DIAGNOSIS — W261XXA Contact with sword or dagger, initial encounter: Secondary | ICD-10-CM

## 2013-05-09 DIAGNOSIS — Z880 Allergy status to penicillin: Secondary | ICD-10-CM | POA: Insufficient documentation

## 2013-05-09 DIAGNOSIS — W260XXA Contact with knife, initial encounter: Secondary | ICD-10-CM | POA: Insufficient documentation

## 2013-05-09 DIAGNOSIS — Z8673 Personal history of transient ischemic attack (TIA), and cerebral infarction without residual deficits: Secondary | ICD-10-CM | POA: Insufficient documentation

## 2013-05-09 DIAGNOSIS — S61509A Unspecified open wound of unspecified wrist, initial encounter: Secondary | ICD-10-CM | POA: Insufficient documentation

## 2013-05-09 DIAGNOSIS — F411 Generalized anxiety disorder: Secondary | ICD-10-CM | POA: Insufficient documentation

## 2013-05-09 DIAGNOSIS — F329 Major depressive disorder, single episode, unspecified: Secondary | ICD-10-CM | POA: Insufficient documentation

## 2013-05-09 DIAGNOSIS — K219 Gastro-esophageal reflux disease without esophagitis: Secondary | ICD-10-CM | POA: Insufficient documentation

## 2013-05-09 HISTORY — PX: NERVE, TENDON AND ARTERY REPAIR: SHX5695

## 2013-05-09 LAB — POCT I-STAT, CHEM 8
BUN: 12 mg/dL (ref 6–23)
Calcium, Ion: 1.2 mmol/L (ref 1.12–1.23)
Chloride: 102 mEq/L (ref 96–112)
Creatinine, Ser: 0.8 mg/dL (ref 0.50–1.10)
Glucose, Bld: 90 mg/dL (ref 70–99)
HCT: 40 % (ref 36.0–46.0)
HEMOGLOBIN: 13.6 g/dL (ref 12.0–15.0)
Potassium: 4.2 mEq/L (ref 3.7–5.3)
Sodium: 142 mEq/L (ref 137–147)
TCO2: 27 mmol/L (ref 0–100)

## 2013-05-09 SURGERY — NERVE, TENDON AND ARTERY REPAIR
Anesthesia: General | Site: Wrist | Laterality: Left

## 2013-05-09 MED ORDER — PROPOFOL 10 MG/ML IV BOLUS
INTRAVENOUS | Status: DC | PRN
  Administered 2013-05-09: 200 mg via INTRAVENOUS

## 2013-05-09 MED ORDER — DIPHENHYDRAMINE HCL 50 MG/ML IJ SOLN
25.0000 mg | Freq: Once | INTRAMUSCULAR | Status: AC
  Administered 2013-05-09 (×2): 12.5 mg via INTRAVENOUS

## 2013-05-09 MED ORDER — MIDAZOLAM HCL 2 MG/2ML IJ SOLN
INTRAMUSCULAR | Status: AC
  Filled 2013-05-09: qty 2

## 2013-05-09 MED ORDER — DOXYCYCLINE HYCLATE 50 MG PO CAPS: 100.0000 mg | ORAL_CAPSULE | Freq: Two times a day (BID) | ORAL | Status: DC

## 2013-05-09 MED ORDER — LIDOCAINE HCL (CARDIAC) 20 MG/ML IV SOLN
INTRAVENOUS | Status: DC | PRN
  Administered 2013-05-09: 30 mg via INTRAVENOUS

## 2013-05-09 MED ORDER — PROMETHAZINE HCL 25 MG/ML IJ SOLN
6.2500 mg | Freq: Once | INTRAMUSCULAR | Status: AC
  Administered 2013-05-09: 6.25 mg via INTRAVENOUS

## 2013-05-09 MED ORDER — BUPIVACAINE HCL (PF) 0.25 % IJ SOLN
INTRAMUSCULAR | Status: AC
  Filled 2013-05-09: qty 30

## 2013-05-09 MED ORDER — PROMETHAZINE HCL 25 MG/ML IJ SOLN
INTRAMUSCULAR | Status: AC
  Filled 2013-05-09: qty 1

## 2013-05-09 MED ORDER — BUPIVACAINE-EPINEPHRINE PF 0.5-1:200000 % IJ SOLN
INTRAMUSCULAR | Status: DC | PRN
  Administered 2013-05-09: 23 mL via PERINEURAL

## 2013-05-09 MED ORDER — DEXAMETHASONE SODIUM PHOSPHATE 10 MG/ML IJ SOLN
INTRAMUSCULAR | Status: DC | PRN
  Administered 2013-05-09: 10 mg via INTRAVENOUS

## 2013-05-09 MED ORDER — FENTANYL CITRATE 0.05 MG/ML IJ SOLN
INTRAMUSCULAR | Status: AC
  Filled 2013-05-09: qty 6

## 2013-05-09 MED ORDER — ONDANSETRON HCL 4 MG/2ML IJ SOLN
INTRAMUSCULAR | Status: DC | PRN
Start: 2013-05-09 — End: 2013-05-09
  Administered 2013-05-09: 4 mg via INTRAVENOUS

## 2013-05-09 MED ORDER — MEPERIDINE HCL 50 MG PO TABS: ORAL_TABLET | ORAL | Status: DC

## 2013-05-09 MED ORDER — DIPHENHYDRAMINE HCL 50 MG/ML IJ SOLN
INTRAMUSCULAR | Status: AC
  Filled 2013-05-09: qty 1

## 2013-05-09 MED ORDER — VANCOMYCIN HCL IN DEXTROSE 1-5 GM/200ML-% IV SOLN
INTRAVENOUS | Status: AC
  Filled 2013-05-09: qty 200

## 2013-05-09 MED ORDER — FENTANYL CITRATE 0.05 MG/ML IJ SOLN
INTRAMUSCULAR | Status: AC
  Filled 2013-05-09: qty 2

## 2013-05-09 MED ORDER — LIDOCAINE HCL (PF) 1 % IJ SOLN
INTRAMUSCULAR | Status: AC
  Filled 2013-05-09: qty 5

## 2013-05-09 MED ORDER — HEPARIN SODIUM (PORCINE) 1000 UNIT/ML IJ SOLN
INTRAMUSCULAR | Status: AC
  Filled 2013-05-09: qty 1

## 2013-05-09 MED ORDER — FENTANYL CITRATE 0.05 MG/ML IJ SOLN
25.0000 ug | INTRAMUSCULAR | Status: DC | PRN
  Administered 2013-05-09 (×2): 50 ug via INTRAVENOUS

## 2013-05-09 MED ORDER — ONDANSETRON HCL 4 MG/2ML IJ SOLN
4.0000 mg | Freq: Once | INTRAMUSCULAR | Status: DC | PRN
Start: 2013-05-09 — End: 2013-05-09

## 2013-05-09 MED ORDER — SODIUM CHLORIDE 0.9 % IV SOLN
1000.0000 mg | INTRAVENOUS | Status: DC | PRN
  Administered 2013-05-09: 1000 mg via INTRAVENOUS

## 2013-05-09 MED ORDER — LACTATED RINGERS IV SOLN
INTRAVENOUS | Status: DC
  Administered 2013-05-09 (×2): via INTRAVENOUS

## 2013-05-09 MED ORDER — LIDOCAINE HCL (PF) 1 % IJ SOLN
INTRAMUSCULAR | Status: AC
  Filled 2013-05-09: qty 30

## 2013-05-09 MED ORDER — DIPHENHYDRAMINE HCL 50 MG/ML IJ SOLN
12.5000 mg | Freq: Once | INTRAMUSCULAR | Status: AC
Start: 2013-05-09 — End: 2013-05-09
  Administered 2013-05-09: 12.5 mg via INTRAVENOUS

## 2013-05-09 MED ORDER — MIDAZOLAM HCL 5 MG/5ML IJ SOLN
INTRAMUSCULAR | Status: DC | PRN
  Administered 2013-05-09: 1 mg via INTRAVENOUS

## 2013-05-09 MED ORDER — FENTANYL CITRATE 0.05 MG/ML IJ SOLN
50.0000 ug | INTRAMUSCULAR | Status: AC | PRN
  Administered 2013-05-09 (×2): 50 ug via INTRAVENOUS

## 2013-05-09 SURGICAL SUPPLY — 59 items
BAG DECANTER FOR FLEXI CONT (MISCELLANEOUS) IMPLANT
BANDAGE ELASTIC 3 VELCRO ST LF (GAUZE/BANDAGES/DRESSINGS) ×2 IMPLANT
BLADE MINI RND TIP GREEN BEAV (BLADE) IMPLANT
BLADE SURG 15 STRL LF DISP TIS (BLADE) ×2 IMPLANT
BLADE SURG 15 STRL SS (BLADE) ×4
BNDG CMPR 9X4 STRL LF SNTH (GAUZE/BANDAGES/DRESSINGS) ×1
BNDG ESMARK 4X9 LF (GAUZE/BANDAGES/DRESSINGS) ×1 IMPLANT
BNDG GAUZE ELAST 4 BULKY (GAUZE/BANDAGES/DRESSINGS) ×2 IMPLANT
CHLORAPREP W/TINT 26ML (MISCELLANEOUS) ×2 IMPLANT
CORDS BIPOLAR (ELECTRODE) ×2 IMPLANT
COVER MAYO STAND STRL (DRAPES) ×2 IMPLANT
COVER TABLE BACK 60X90 (DRAPES) ×2 IMPLANT
CUFF TOURNIQUET SINGLE 18IN (TOURNIQUET CUFF) IMPLANT
DECANTER SPIKE VIAL GLASS SM (MISCELLANEOUS) ×2 IMPLANT
DRAPE EXTREMITY T 121X128X90 (DRAPE) ×2 IMPLANT
DRAPE SURG 17X23 STRL (DRAPES) ×2 IMPLANT
GAUZE PACKING IODOFORM 1/4X15 (GAUZE/BANDAGES/DRESSINGS) ×1 IMPLANT
GAUZE XEROFORM 1X8 LF (GAUZE/BANDAGES/DRESSINGS) ×2 IMPLANT
GLOVE BIO SURGEON STRL SZ7.5 (GLOVE) ×2 IMPLANT
GLOVE BIOGEL PI IND STRL 7.0 (GLOVE) IMPLANT
GLOVE BIOGEL PI IND STRL 8 (GLOVE) ×1 IMPLANT
GLOVE BIOGEL PI INDICATOR 7.0 (GLOVE) ×1
GLOVE BIOGEL PI INDICATOR 8 (GLOVE) ×1
GLOVE ECLIPSE 7.0 STRL STRAW (GLOVE) ×1 IMPLANT
GLOVE EXAM NITRILE PF MED BLUE (GLOVE) ×1 IMPLANT
GOWN BRE IMP PREV XXLGXLNG (GOWN DISPOSABLE) ×2 IMPLANT
GOWN PREVENTION PLUS XLARGE (GOWN DISPOSABLE) ×2 IMPLANT
LOOP VESSEL MAXI BLUE (MISCELLANEOUS) IMPLANT
NDL HYPO 25X1 1.5 SAFETY (NEEDLE) IMPLANT
NDL SAFETY ECLIPSE 18X1.5 (NEEDLE) IMPLANT
NEEDLE HYPO 18GX1.5 SHARP (NEEDLE)
NEEDLE HYPO 25X1 1.5 SAFETY (NEEDLE) IMPLANT
NS IRRIG 1000ML POUR BTL (IV SOLUTION) ×2 IMPLANT
PACK BASIN DAY SURGERY FS (CUSTOM PROCEDURE TRAY) ×2 IMPLANT
PAD CAST 3X4 CTTN HI CHSV (CAST SUPPLIES) ×1 IMPLANT
PAD CAST 4YDX4 CTTN HI CHSV (CAST SUPPLIES) IMPLANT
PADDING CAST ABS 4INX4YD NS (CAST SUPPLIES) ×1
PADDING CAST ABS COTTON 4X4 ST (CAST SUPPLIES) ×1 IMPLANT
PADDING CAST COTTON 3X4 STRL (CAST SUPPLIES) ×2
PADDING CAST COTTON 4X4 STRL (CAST SUPPLIES)
SLEEVE SCD COMPRESS KNEE MED (MISCELLANEOUS) ×1 IMPLANT
SPEAR EYE SURG WECK-CEL (MISCELLANEOUS) ×1 IMPLANT
SPLINT PLASTER CAST XFAST 3X15 (CAST SUPPLIES) IMPLANT
SPLINT PLASTER XTRA FASTSET 3X (CAST SUPPLIES) ×10
SPONGE GAUZE 4X4 12PLY (GAUZE/BANDAGES/DRESSINGS) ×2 IMPLANT
STOCKINETTE 4X48 STRL (DRAPES) ×2 IMPLANT
SUT ETHIBOND 3-0 V-5 (SUTURE) IMPLANT
SUT ETHILON 4 0 PS 2 18 (SUTURE) ×2 IMPLANT
SUT FIBERWIRE 4-0 18 TAPR NDL (SUTURE)
SUT MERSILENE 6 0 P 1 (SUTURE) IMPLANT
SUT NYLON 9 0 VRM6 (SUTURE) IMPLANT
SUT PROLENE 6 0 P 1 18 (SUTURE) IMPLANT
SUT SILK 4 0 PS 2 (SUTURE) IMPLANT
SUT VICRYL 4-0 PS2 18IN ABS (SUTURE) IMPLANT
SUTURE FIBERWR 4-0 18 TAPR NDL (SUTURE) IMPLANT
SYR BULB 3OZ (MISCELLANEOUS) ×2 IMPLANT
SYR CONTROL 10ML LL (SYRINGE) ×1 IMPLANT
TOWEL OR 17X24 6PK STRL BLUE (TOWEL DISPOSABLE) ×3 IMPLANT
UNDERPAD 30X30 INCONTINENT (UNDERPADS AND DIAPERS) ×2 IMPLANT

## 2013-05-09 NOTE — Transfer of Care (Signed)
Immediate Anesthesia Transfer of Care Note  Patient: [name]  Procedure(s) Performed: Procedure(s): LEFT WRIST EXPLORATION  (Left)  Patient Location: PACU  Anesthesia Type:GA combined with regional for post-op pain  Level of Consciousness: awake, alert , oriented and patient cooperative  Airway & Oxygen Therapy: Patient Spontanous Breathing and Patient connected to face mask oxygen  Post-op Assessment: Report given to PACU RN and Post -op Vital signs reviewed and stable  Post vital signs: Reviewed and stable  Complications: No apparent anesthesia complications

## 2013-05-09 NOTE — Anesthesia Postprocedure Evaluation (Signed)
  Anesthesia Post-op Note  Patient: [name]  Procedure(s) Performed: Procedure(s): LEFT WRIST EXPLORATION  (Left)  Patient Location: PACU  Anesthesia Type:GA combined with regional for post-op pain  Level of Consciousness: awake, alert  and oriented  Airway and Oxygen Therapy: Patient Spontanous Breathing  Post-op Pain: mild  Post-op Assessment: Post-op Vital signs reviewed  Post-op Vital Signs: Reviewed  Complications: No apparent anesthesia complications

## 2013-05-09 NOTE — Discharge Instructions (Addendum)
[facility] Instructions °Hand Surgery ° °Wound Care: °Keep your hand elevated above the level of your heart.  Do not allow it to dangle by your side.  Keep the dressing dry and do not remove it unless your doctor advises you to do so.  He will usually change it at the time of your post-op visit.  Moving your fingers is advised to stimulate circulation but will depend on the site of your surgery.  If you have a splint applied, your doctor will advise you regarding movement. ° °Activity: °Do not drive or operate machinery today.  Rest today and then you may return to your normal activity and work as indicated by your physician. ° °Diet:  °Drink liquids today or eat a light diet.  You may resume a regular diet tomorrow.   ° °General expectations: °Pain for two to three days. °Fingers may become slightly swollen. ° °Call your doctor if any of the following occur: °Severe pain not relieved by pain medication. °Elevated temperature. °Dressing soaked with blood. °Inability to move fingers. °White or bluish color to fingers. ° ° °Post Anesthesia Home Care Instructions ° °Activity: °Get plenty of rest for the remainder of the day. A responsible adult should stay with you for 24 hours following the procedure.  °For the next 24 hours, DO NOT: °-Drive a car °-Operate machinery °-Drink alcoholic beverages °-Take any medication unless instructed by your physician °-Make any legal decisions or sign important papers. ° °Meals: °Start with liquid foods such as gelatin or soup. Progress to regular foods as tolerated. Avoid greasy, spicy, heavy foods. If nausea and/or vomiting occur, drink only clear liquids until the nausea and/or vomiting subsides. Call your physician if vomiting continues. ° °Special Instructions/Symptoms: °Your throat may feel dry or sore from the anesthesia or the breathing tube placed in your throat during surgery. If this causes discomfort, gargle with warm salt water. The discomfort should disappear within 24  hours. ° ° °Regional Anesthesia Blocks ° °1. Numbness or the inability to move the "blocked" extremity may last from 3-48 hours after placement. The length of time depends on the medication injected and your individual response to the medication. If the numbness is not going away after 48 hours, call your surgeon. ° °2. The extremity that is blocked will need to be protected until the numbness is gone and the  Strength has returned. Because you cannot feel it, you will need to take extra care to avoid injury. Because it may be weak, you may have difficulty moving it or using it. You may not know what position it is in without looking at it while the block is in effect. ° °3. For blocks in the legs and feet, returning to weight bearing and walking needs to be done carefully. You will need to wait until the numbness is entirely gone and the strength has returned. You should be able to move your leg and foot normally before you try and bear weight or walk. You will need someone to be with you when you first try to ensure you do not fall and possibly risk injury. ° °4. Bruising and tenderness at the needle site are common side effects and will resolve in a few days. ° °5. Persistent numbness or new problems with movement should be communicated to the surgeon or the [facility] ([phone])/ [facility] ([phone]). °

## 2013-05-09 NOTE — H&P (Signed)
[name] is an 58 y.o. female.   Chief Complaint: left wrist laceration HPI: 58 yo rhd female states she lacerated left wrist while trying to open package with a butcher knife 05/02/13.  [facility] wounds [facility]'d and sutured.  Noted numbness in fingertips.  Reports no previous injury to wrist and no other injury at this time.  Past Medical History  Diagnosis Date  . Mitral valve prolapse   . Anxiety   . Depression     history of prior suicide attempts  . Allergic rhinitis   . Broken ribs     Trauma falling off of horse November 2012  . DVT (deep venous thrombosis)     remote at age 58  . Herniated nucleus pulposus, L4-5 left 2001    s/p Left microsurgical exploration L4-5 and microdiskectomy with lysis  . Hypotension     history of hypotension in the past requiring fluid boluses  . Chronic pain syndrome   . CVA (cerebral infarction)     reported by patient, no documentation  . Atypical chest pain     cath 2007 showing normal coronary arteries, last echo 2003 with normal EF and no systolic dysfunction  . Chest pain   . Stroke     hx of TIA  . Asthma   . Shortness of breath   . GERD (gastroesophageal reflux disease)   . Seizures   . Pneumonia 08/29/2011    Past Surgical History  Procedure Laterality Date  . Cervical spine surgery    . Right arm surgery    . Lower back surgery    . Cholescystectomy  2003     Laparoscopic cholecystectomy with intraoperative  . Appendectomy    . Tonsillectomy    . Abdominal hysterectomy    . Oophorectomy      Family History  Problem Relation Age of Onset  . Heart attack Brother     CABG  . Hypertrophic cardiomyopathy Son   . Cancer Mother     Uterine  . Cancer Father     brain   Social History:  reports that she has never smoked. She has never used smokeless tobacco. She reports that she does not drink alcohol or use illicit drugs.  Allergies:  Allergies  Allergen Reactions  . Amoxicillin     REACTION: Stomach  bleeding  . Ampicillin     REACTION: Stomach bleeding  . Aspirin     REACTION: ulcer and GI bleeding  . Cephalexin     REACTION: Hives  . Ciprofloxacin     REACTION: Throat swells shut  . Darvocet [Propoxyphene N-Acetaminophen]     REACTION: Vomitting  . Erythromycin     REACTION: stomach ache  . Fentanyl   . Heparin     MAKES HEART STOP BEATING.   Marland Kitchen. Hydrocodone     REACTION: vomiting  . Hydrocodone-Acetaminophen     REACTION: Vomitting  . Hyoscyamine Sulfate     REACTION: Not sure  . Imipramine Hcl     REACTION: Itching  . Indomethacin     MAKES HEART STOP BEATING.   Marland Kitchen. Ketorolac Tromethamine     REACTION: Vomitting  . Ketorolac Tromethamine     unknown  . Nortriptyline Hcl     REACTION: Hyperactive  . Nsaids     REACTION: Stomach bleeding  . Oxycodone   . Pentazocine Lactate     unknown  . Pentazocine-Naloxone     REACTION: Vomitting  . Percocet [Oxycodone-Acetaminophen]  REACTION: Vomitting  . Sertraline Hcl     REACTION: Hyperactive - bounce off the wall  . Tramadol Hcl     REACTION: Vomitting    Medications Prior to Admission  Medication Sig Dispense Refill  . albuterol (PROVENTIL HFA;VENTOLIN HFA) 108 (90 BASE) MCG/ACT inhaler Inhale 2 puffs into the lungs every 6 (six) hours as needed for wheezing.  1 Inhaler  0  . aspirin EC 81 MG tablet Take 81 mg by mouth every evening.      . cetirizine (ZYRTEC) 10 MG tablet Take 10 mg by mouth daily.      Marland Kitchen FLUoxetine (PROZAC) 20 MG tablet Take 60 mg by mouth every morning.      . gabapentin (NEURONTIN) 300 MG capsule Take 900-1,800 mg by mouth 3 (three) times daily. Take 900 MG twice a day, and take 1800 MG at bedtime.      . hydrOXYzine (VISTARIL) 25 MG capsule Take 1 capsule by mouth 3 (three) times daily.      Marland Kitchen LORazepam (ATIVAN) 1 MG tablet Take 1 mg by mouth 4 (four) times daily as needed. For anxiety.      . meperidine (DEMEROL) 50 MG tablet Take 50 mg by mouth every 6 (six) hours as needed. For pain.       . metoprolol succinate (TOPROL-XL) 25 MG 24 hr tablet Take 25 mg by mouth daily.       Marland Kitchen omeprazole (PRILOSEC) 20 MG capsule Take 1 capsule by mouth 2 (two) times daily.      . promethazine (PHENERGAN) 25 MG tablet Take 25 mg by mouth every 6 (six) hours as needed. For nausea. Takes with Demerol.        No results found for this or any previous visit (from the past 48 hour(s)).  No results found.   A comprehensive review of systems was negative except for: Eyes: positive for contacts/glasses Ears, nose, mouth, throat, and face: positive for tinnitus Respiratory: positive for asthma Behavioral/Psych: positive for depression  There were no vitals taken for this visit.  General appearance: alert, cooperative and appears stated age Head: Normocephalic, without obvious abnormality, atraumatic Neck: supple, symmetrical, trachea midline Resp: clear to auscultation bilaterally Cardio: regular rate and rhythm GI: non tender Extremities: decreased sensation in fingertips on left.  brisk capillary refill all digits.  weak fpl.  intact epl/io.  wounds volar radial wrist with sutures in place.  no erythema. Pulses: 2+ and symmetric Skin: Skin color, texture, turgor normal. No rashes or lesions Neurologic: Grossly normal except as above Incision/Wound: As above  Assessment/Plan Left wrist lacerations.  Non operative and operative treatment options were discussed with the patient and patient wishes to proceed with operative treatment. Plan exploration of left wrist lacerations with repair tendon/artery/nerve as necessary.  Risks, benefits, and alternatives of surgery were discussed and the patient agrees with the plan of care.   Micahel Omlor R 05/09/2013, 1:12 PM

## 2013-05-09 NOTE — Anesthesia Preprocedure Evaluation (Signed)
Anesthesia Evaluation  Patient identified by MRN, date of birth, ID band Patient awake    Reviewed: Allergy & Precautions, H&P , NPO status , Patient's Chart, lab work & pertinent test results, reviewed documented beta blocker date and time   Airway Mallampati: I TM Distance: >3 FB Neck ROM: Full    Dental  (+) Teeth Intact and Dental Advisory Given   Pulmonary asthma ,  breath sounds clear to auscultation        Cardiovascular Rhythm:Regular Rate:Normal     Neuro/Psych    GI/Hepatic   Endo/Other    Renal/GU      Musculoskeletal   Abdominal   Peds  Hematology   Anesthesia Other Findings   Reproductive/Obstetrics                           Anesthesia Physical Anesthesia Plan  ASA: III  Anesthesia Plan: General   Post-op Pain Management:    Induction: Intravenous  Airway Management Planned: LMA  Additional Equipment:   Intra-op Plan:   Post-operative Plan: Extubation in OR  Informed Consent: I have reviewed the patients History and Physical, chart, labs and discussed the procedure including the risks, benefits and alternatives for the proposed anesthesia with the patient or authorized representative who has indicated his/her understanding and acceptance.   Dental advisory given  Plan Discussed with: CRNA, Anesthesiologist and Surgeon  Anesthesia Plan Comments:         Anesthesia Quick Evaluation

## 2013-05-09 NOTE — Op Note (Signed)
[id number] 

## 2013-05-09 NOTE — Anesthesia Procedure Notes (Addendum)
Anesthesia Regional Block:  Supraclavicular block  Pre-Anesthetic Checklist: ,, timeout performed, Correct Patient, Correct Site, Correct Laterality, Correct Procedure, Correct Position, site marked, Risks and benefits discussed,  Surgical consent,  Pre-op evaluation,  At surgeon's request and post-op pain management  Laterality: Left and Upper  Prep: chloraprep       Needles:  Injection technique: Single-shot  Needle Type: Echogenic Stimulator Needle     Needle Length: 5cm 5 cm Needle Gauge: 21 and 21 G    Additional Needles:  Procedures: ultrasound guided (picture in chart) Supraclavicular block Narrative:  Start time: 05/09/2013 2:00 PM End time: 05/09/2013 2:08 PM Injection made incrementally with aspirations every 5 mL.  Performed by: Personally  Anesthesiologist: Sheldon Silvanavid Crews   Procedure Name: LMA Insertion Date/Time: 05/09/2013 2:57 PM Performed by: Boomer Winders Pre-anesthesia Checklist: Patient identified, Emergency Drugs available, Suction available and Patient being monitored Patient Re-evaluated:Patient Re-evaluated prior to inductionOxygen Delivery Method: Circle System Utilized Preoxygenation: Pre-oxygenation with 100% oxygen Intubation Type: IV induction Ventilation: Mask ventilation without difficulty LMA: LMA inserted LMA Size: 4.0 Number of attempts: 1 Airway Equipment and Method: bite block Placement Confirmation: positive ETCO2 Tube secured with: Tape Dental Injury: Teeth and Oropharynx as per pre-operative assessment

## 2013-05-09 NOTE — Brief Op Note (Signed)
05/09/2013  3:28 PM  PATIENT:  [name]  58 y.o. female  PRE-OPERATIVE DIAGNOSIS:  LEFT WRIST LACERATION POSSIBLE TENDON/ARTERY/NERVE   POST-OPERATIVE DIAGNOSIS:  left wrist exploration  PROCEDURE:  Procedure(s): LEFT WRIST EXPLORATION  (Left)  SURGEON:  Surgeon(s) and Role:    * Tami RibasKevin R Marylynn Rigdon, MD - Primary    * Nicki ReaperGary R Rastus Borton, MD - Assisting  PHYSICIAN ASSISTANT:   ASSISTANTS: Cindee SaltGary Suha Schoenbeck, MD   ANESTHESIA:   regional and general  EBL:  Total I/O In: 1000 [I.V.:1000] Out: -   BLOOD ADMINISTERED:none  DRAINS: iodoform packing  LOCAL MEDICATIONS USED:  NONE  SPECIMEN:  Source of Specimen:  left wrist  DISPOSITION OF SPECIMEN:  micro  COUNTS:  YES  TOURNIQUET:   Total Tourniquet Time Documented: Upper Arm (Left) - 15 minutes Total: Upper Arm (Left) - 15 minutes   DICTATION: .Other Dictation: Dictation Number [id number]  PLAN OF CARE: Discharge to home after PACU  PATIENT DISPOSITION:  PACU - hemodynamically stable.

## 2013-05-10 NOTE — Op Note (Signed)
[name], [name]              ACCOUNT NO.:  [id number]  MEDICAL RECORD NO.:  [id number]  LOCATION:                                 FACILITY:  PHYSICIAN:  Betha Loa, MD        DATE OF BIRTH:  1956-02-16  DATE OF PROCEDURE:  05/09/2013 DATE OF DISCHARGE:                              OPERATIVE REPORT   PREOPERATIVE DIAGNOSIS:  Left wrist laceration.  POSTOPERATIVE DIAGNOSIS:  Left wrist laceration with wound infection.  PROCEDURE:  Left wrist exploration of wound and irrigation and debridement.  SURGEON:  Betha Loa, MD.  ASSISTANT:  Cindee Salt, M.D.  ANESTHESIA:  General with regional.  IV FLUIDS:  Per anesthesia flow sheet.  ESTIMATED BLOOD LOSS:  Minimal.  COMPLICATIONS:  None.  SPECIMENS:  Left wrist wound cultures to micro.  TOURNIQUET TIME:  15 minutes.  DISPOSITION:  Stable to PACU.  INDICATIONS:  [name] is a 58 year old female who 1 week ago was trying to open a package with the knife and the knife slipped and lacerated the left wrist.  She was seen at the emergency department where the wound was irrigated and debrided, and sutures placed.  She followed up in the office.  She noted numbness in the fingers.  We discussed continued observation versus operative exploration.  Risks, benefits, and alternatives of surgery were discussed including risk of blood loss, infection, damage to nerves, vessels, tendons, ligaments, bone, failure of surgery, need for additional surgery, complications with wound healing, continued pain.  She voiced of these risks and elected to proceed.  DESCRIPTION OF PROCEDURE:  After being identified preoperatively by myself, the patient and I agreed upon procedure and site of procedure. Surgical site was marked.  The risks, benefits, and alternatives of surgery were reviewed and she wished to proceed.  Surgical consent had been signed.  She was given 1 g of IV vancomycin as preoperative antibiotic prophylaxis due to  penicillin allergy.  A regional block was performed by anesthesia in preoperative holding area.  She was transferred the operating room and placed on the operating room table in supine position with left upper extremity on arm board.  General anesthesia was induced by the anesthesiologist.  The left upper extremity was prepped and draped in normal sterile orthopedic fashion. Surgical pause was performed between surgeons, anesthesia, operating staff, and all were in agreement as to the patient, procedure, and site of procedure.  The sutures have been removed prior to prepping and draping.  The wound was spread.  There was cloudy fluid from within. Cultures for aerobes and anaerobes were taken.  The 2 wounds were connected.  There was muscle laceration.  They were explored.  The EPB tendon was intact.  The joint capsule was intact.  The FCR tendon had a few fibers injured, but overall was intact.  The superficial branch of the radial artery was identified and had clotted off.  The palmar cutaneous branch was felt to be outside of the zone of injury.  The wound was debrided of devitalized tissue.  The areas of suture abscess were sharply debrided with the scissors.  The wound was copiously irrigated with sterile saline, then packed open  with quarter-inch iodoform gauze.  It was then dressed with sterile Xeroform, 4 x 4s, and wrapped with a Kerlix bandage.  A thumb spica splint was placed and wrapped with Kerlix and Ace bandage.  Tourniquet was deflated to 15 minutes.  All fingertips were pink with brisk capillary refill after deflation of tourniquet.  The operative drapes were broken down and the patient was awoken from anesthesia safely.  She was transferred back to stretcher and taken to PACU in stable condition.  I will see her back in the office in the beginning of next week for postoperative followup.  I will give her doxycycline 100 mg p.o. b.i.d. x10 days coverage for the wound  infection and Demerol 50 mg 1-2 p.o. q.6 hours p.r.n. pain, dispensed #30.     Betha LoaKevin Damali Broadfoot, MD     KK/MEDQ  D:  05/09/2013  T:  05/09/2013  Job:  [id number]

## 2013-05-13 ENCOUNTER — Encounter (HOSPITAL_BASED_OUTPATIENT_CLINIC_OR_DEPARTMENT_OTHER): Payer: Self-pay | Admitting: Orthopedic Surgery

## 2013-05-16 LAB — WOUND CULTURE: GRAM STAIN: NONE SEEN

## 2013-05-17 LAB — ANAEROBIC CULTURE: Gram Stain: NONE SEEN

## 2013-06-06 ENCOUNTER — Emergency Department (HOSPITAL_COMMUNITY)
Admission: EM | Admit: 2013-06-06 | Discharge: 2013-06-06 | Disposition: A | Discharge status: Home / Self Care | Payer: Medicare Other | Attending: Emergency Medicine | Admitting: Emergency Medicine

## 2013-06-06 ENCOUNTER — Emergency Department (HOSPITAL_COMMUNITY): Payer: Medicare Other

## 2013-06-06 ENCOUNTER — Encounter (HOSPITAL_COMMUNITY): Payer: Self-pay | Admitting: Emergency Medicine

## 2013-06-06 DIAGNOSIS — Z79899 Other long term (current) drug therapy: Secondary | ICD-10-CM | POA: Insufficient documentation

## 2013-06-06 DIAGNOSIS — F411 Generalized anxiety disorder: Secondary | ICD-10-CM | POA: Insufficient documentation

## 2013-06-06 DIAGNOSIS — Z8669 Personal history of other diseases of the nervous system and sense organs: Secondary | ICD-10-CM | POA: Insufficient documentation

## 2013-06-06 DIAGNOSIS — J189 Pneumonia, unspecified organism: Secondary | ICD-10-CM | POA: Insufficient documentation

## 2013-06-06 DIAGNOSIS — H9209 Otalgia, unspecified ear: Secondary | ICD-10-CM | POA: Insufficient documentation

## 2013-06-06 DIAGNOSIS — Z8673 Personal history of transient ischemic attack (TIA), and cerebral infarction without residual deficits: Secondary | ICD-10-CM | POA: Insufficient documentation

## 2013-06-06 DIAGNOSIS — R509 Fever, unspecified: Secondary | ICD-10-CM | POA: Insufficient documentation

## 2013-06-06 DIAGNOSIS — F329 Major depressive disorder, single episode, unspecified: Secondary | ICD-10-CM | POA: Insufficient documentation

## 2013-06-06 DIAGNOSIS — Z7982 Long term (current) use of aspirin: Secondary | ICD-10-CM | POA: Insufficient documentation

## 2013-06-06 DIAGNOSIS — F3289 Other specified depressive episodes: Secondary | ICD-10-CM | POA: Insufficient documentation

## 2013-06-06 DIAGNOSIS — Z86718 Personal history of other venous thrombosis and embolism: Secondary | ICD-10-CM | POA: Insufficient documentation

## 2013-06-06 DIAGNOSIS — J45901 Unspecified asthma with (acute) exacerbation: Secondary | ICD-10-CM | POA: Insufficient documentation

## 2013-06-06 DIAGNOSIS — Z880 Allergy status to penicillin: Secondary | ICD-10-CM | POA: Insufficient documentation

## 2013-06-06 DIAGNOSIS — Z87828 Personal history of other (healed) physical injury and trauma: Secondary | ICD-10-CM | POA: Insufficient documentation

## 2013-06-06 DIAGNOSIS — J209 Acute bronchitis, unspecified: Secondary | ICD-10-CM

## 2013-06-06 DIAGNOSIS — I959 Hypotension, unspecified: Secondary | ICD-10-CM | POA: Insufficient documentation

## 2013-06-06 DIAGNOSIS — K219 Gastro-esophageal reflux disease without esophagitis: Secondary | ICD-10-CM | POA: Insufficient documentation

## 2013-06-06 DIAGNOSIS — G8929 Other chronic pain: Secondary | ICD-10-CM | POA: Insufficient documentation

## 2013-06-06 DIAGNOSIS — Z792 Long term (current) use of antibiotics: Secondary | ICD-10-CM | POA: Insufficient documentation

## 2013-06-06 MED ORDER — ALBUTEROL SULFATE (2.5 MG/3ML) 0.083% IN NEBU
5.0000 mg | INHALATION_SOLUTION | Freq: Once | RESPIRATORY_TRACT | Status: AC
  Administered 2013-06-06: 5 mg via RESPIRATORY_TRACT
  Filled 2013-06-06: qty 6

## 2013-06-06 MED ORDER — AZITHROMYCIN 250 MG PO TABS: ORAL_TABLET | ORAL | Status: DC

## 2013-06-06 MED ORDER — ACETAMINOPHEN 500 MG PO TABS
1000.0000 mg | ORAL_TABLET | Freq: Once | ORAL | Status: DC
  Filled 2013-06-06: qty 2

## 2013-06-06 NOTE — ED Provider Notes (Signed)
CSN: [id number]     Arrival date & time 06/06/13  1004 History  This chart was scribed for Tracy Lyons, MD by Shari Heritage, ED Scribe. The patient was seen in room APA01/APA01. Patient's care was started at 10:25 AM.    Chief Complaint  Patient presents with  . Asthma  . Fever  . Otalgia   The history is provided by the patient. No language interpreter was used.     HPI Comments: [name] is a 58 y.o. female with history of asthma who presents to the Emergency Department complaining of constant moderate shortness of breath and persistent productive cough onset 2 days ago. Cough is productive of thick grey/green sputum. There is associated subjective fever, bilateral ear pain and throat pain. She has been using her inhaler with increased frequency since symptom onset. She reports a sick contact of her son with similar symptoms. She reports no other chronic medical conditions.     Past Medical History  Diagnosis Date  . Mitral valve prolapse   . Anxiety   . Depression     history of prior suicide attempts  . Allergic rhinitis   . Broken ribs     Trauma falling off of horse November 2012  . DVT (deep venous thrombosis)     remote at age 21  . Herniated nucleus pulposus, L4-5 left 2001    s/p Left microsurgical exploration L4-5 and microdiskectomy with lysis  . Hypotension     history of hypotension in the past requiring fluid boluses  . Chronic pain syndrome   . CVA (cerebral infarction)     reported by patient, no documentation  . Atypical chest pain     cath 2007 showing normal coronary arteries, last echo 2003 with normal EF and no systolic dysfunction  . Chest pain   . Stroke     hx of TIA  . Asthma   . Shortness of breath   . GERD (gastroesophageal reflux disease)   . Seizures   . Pneumonia 08/29/2011   Past Surgical History  Procedure Laterality Date  . Cervical spine surgery    . Right arm surgery    . Lower back surgery    . Cholescystectomy  2003   Laparoscopic cholecystectomy with intraoperative  . Appendectomy    . Tonsillectomy    . Abdominal hysterectomy    . Oophorectomy    . Nerve, tendon and artery repair Left 05/09/2013    Procedure: LEFT WRIST EXPLORATION ;  Surgeon: Tami Ribas, MD;  Location: [facility];  Service: Orthopedics;  Laterality: Left;   Family History  Problem Relation Age of Onset  . Heart attack Brother     CABG  . Hypertrophic cardiomyopathy Son   . Cancer Mother     Uterine  . Cancer Father     brain   History  Substance Use Topics  . Smoking status: Never Smoker   . Smokeless tobacco: Never Used  . Alcohol Use: No   OB History   Grav Para Term Preterm Abortions TAB SAB Ect Mult Living                 Review of Systems A complete 10 system review of systems was obtained and all systems are negative except as noted in the HPI and PMH.    Allergies  Amoxicillin; Ampicillin; Aspirin; Cephalexin; Ciprofloxacin; Darvocet; Erythromycin; Fentanyl; Heparin; Hydrocodone; Hydrocodone-acetaminophen; Hyoscyamine sulfate; Imipramine hcl; Indomethacin; Ketorolac tromethamine; Ketorolac tromethamine; Nortriptyline hcl; Nsaids; Oxycodone;  Pentazocine lactate; Pentazocine-naloxone; Percocet; Sertraline hcl; and Tramadol hcl  Home Medications   Current Outpatient Rx  Name  Route  Sig  Dispense  Refill  . EXPIRED: albuterol (PROVENTIL HFA;VENTOLIN HFA) 108 (90 BASE) MCG/ACT inhaler   Inhalation   Inhale 2 puffs into the lungs every 6 (six) hours as needed for wheezing.   1 Inhaler   0   . aspirin EC 81 MG tablet   Oral   Take 81 mg by mouth every evening.         . cetirizine (ZYRTEC) 10 MG tablet   Oral   Take 10 mg by mouth daily.         Marland Kitchen doxycycline (VIBRAMYCIN) 50 MG capsule   Oral   Take 2 capsules (100 mg total) by mouth 2 (two) times daily.   40 capsule   0   . FLUoxetine (PROZAC) 20 MG tablet   Oral   Take 60 mg by mouth every morning.         . gabapentin  (NEURONTIN) 300 MG capsule   Oral   Take 900-1,800 mg by mouth 3 (three) times daily. Take 900 MG twice a day, and take 1800 MG at bedtime.         . hydrOXYzine (VISTARIL) 25 MG capsule   Oral   Take 1 capsule by mouth 3 (three) times daily.         Marland Kitchen LORazepam (ATIVAN) 1 MG tablet   Oral   Take 1 mg by mouth 4 (four) times daily as needed. For anxiety.         . meperidine (DEMEROL) 50 MG tablet   Oral   Take 50 mg by mouth every 6 (six) hours as needed. For pain.         . meperidine (DEMEROL) 50 MG tablet      1-2 tabs po q6 hours prn pain   30 tablet   0   . metoprolol succinate (TOPROL-XL) 25 MG 24 hr tablet   Oral   Take 25 mg by mouth daily.          Marland Kitchen omeprazole (PRILOSEC) 20 MG capsule   Oral   Take 1 capsule by mouth 2 (two) times daily.         . promethazine (PHENERGAN) 25 MG tablet   Oral   Take 25 mg by mouth every 6 (six) hours as needed. For nausea. Takes with Demerol.          Triage Vitals: BP 120/73  Pulse 97  Temp(Src) 99 F (37.2 C) (Oral)  Resp 22  Ht 5\' 5"  (1.651 m)  Wt 185 lb (83.915 kg)  BMI 30.79 kg/m2  SpO2 95% Physical Exam  Constitutional: She is oriented to person, place, and time. She appears well-developed and well-nourished.  HENT:  Head: Normocephalic and atraumatic.  Right Ear: Tympanic membrane and external ear normal.  Left Ear: Tympanic membrane and external ear normal.  Eyes: Conjunctivae and EOM are normal. Pupils are equal, round, and reactive to light.  Neck: Normal range of motion. Neck supple.  Cardiovascular: Normal rate, regular rhythm and normal heart sounds.   No murmur heard. Pulmonary/Chest: Effort normal. No respiratory distress. She has no wheezes. She has rhonchi. She has no rales.  Bilateral expiratory rhonchi are audible.  Abdominal: Soft. Bowel sounds are normal. There is no tenderness. There is no rebound and no guarding.  Musculoskeletal: Normal range of motion. She exhibits no edema and  no tenderness.  Neurological: She is alert and oriented to person, place, and time.  Skin: Skin is warm and dry.  Psychiatric: She has a normal mood and affect. Her behavior is normal.    ED Course  Procedures (including critical care time) DIAGNOSTIC STUDIES: Oxygen Saturation is 95% on room air, adequate by my interpretation.    COORDINATION OF CARE: 10:30 AM- Will order CXR and breathing treatment. Patient informed of current plan for treatment and evaluation and agrees with plan at this time.     Labs Review Labs Reviewed - No data to display Imaging Review No results found.    MDM  No diagnosis found. Patient is a 58 year old female who presents with chest congestion and productive cough. She has a history of recurrent bronchitis and believes this is what is happening again. Her lungs have expiratory rhonchi. She was given an albuterol treatment for this. Chest x-ray does not reveal an acute infiltrate and is otherwise unremarkable. She will be treated with an antibiotic and discharged to home. She is to return if her symptoms worsen or change.   I personally performed the services described in this documentation, which was scribed in my presence. The recorded information has been reviewed and is accurate.      Tracy Lyonsouglas Cass Edinger, MD 06/06/13 1155

## 2013-06-06 NOTE — ED Notes (Signed)
Patient states tylenol gives her a headache, states she took motrin 800 mg prior to arrival

## 2013-06-06 NOTE — ED Notes (Signed)
Pt stated she wants to stay back here until her ride comes. She has a 205 month old at home and she does not want to wait out there with sick people

## 2013-06-06 NOTE — Discharge Instructions (Signed)
Zithromax as prescribed.  Over-the-counter cough syrup as needed for cough.  Tylenol 1000 mg every 6 hours as needed for fever or pain.   Acute Bronchitis Bronchitis is inflammation of the airways that extend from the windpipe into the lungs (bronchi). The inflammation often causes mucus to develop. This leads to a cough, which is the most common symptom of bronchitis.  In acute bronchitis, the condition usually develops suddenly and goes away over time, usually in a couple weeks. Smoking, allergies, and asthma can make bronchitis worse. Repeated episodes of bronchitis may cause further lung problems.  CAUSES Acute bronchitis is most often caused by the same virus that causes a cold. The virus can spread from person to person (contagious).  SIGNS AND SYMPTOMS   Cough.   Fever.   Coughing up mucus.   Body aches.   Chest congestion.   Chills.   Shortness of breath.   Sore throat.  DIAGNOSIS  Acute bronchitis is usually diagnosed through a physical exam. Tests, such as chest X-rays, are sometimes done to rule out other conditions.  TREATMENT  Acute bronchitis usually goes away in a couple weeks. Often times, no medical treatment is necessary. Medicines are sometimes given for relief of fever or cough. Antibiotics are usually not needed but may be prescribed in certain situations. In some cases, an inhaler may be recommended to help reduce shortness of breath and control the cough. A cool mist vaporizer may also be used to help thin bronchial secretions and make it easier to clear the chest.  HOME CARE INSTRUCTIONS  Get plenty of rest.   Drink enough fluids to keep your urine clear or pale yellow (unless you have a medical condition that requires fluid restriction). Increasing fluids may help thin your secretions and will prevent dehydration.   Only take over-the-counter or prescription medicines as directed by your health care provider.   Avoid smoking and secondhand  smoke. Exposure to cigarette smoke or irritating chemicals will make bronchitis worse. If you are a smoker, consider using nicotine gum or skin patches to help control withdrawal symptoms. Quitting smoking will help your lungs heal faster.   Reduce the chances of another bout of acute bronchitis by washing your hands frequently, avoiding people with cold symptoms, and trying not to touch your hands to your mouth, nose, or eyes.   Follow up with your health care provider as directed.  SEEK MEDICAL CARE IF: Your symptoms do not improve after 1 week of treatment.  SEEK IMMEDIATE MEDICAL CARE IF:  You develop an increased fever or chills.   You have chest pain.   You have severe shortness of breath.  You have bloody sputum.   You develop dehydration.  You develop fainting.  You develop repeated vomiting.  You develop a severe headache. MAKE SURE YOU:   Understand these instructions.  Will watch your condition.  Will get help right away if you are not doing well or get worse. Document Released: 06/01/2004 Document Revised: 12/25/2012 Document Reviewed: 10/15/2012 [facility] Patient Information 2014 [employer], [place].

## 2013-06-06 NOTE — ED Notes (Signed)
Patient c/o fever, productive cough with thick green/grey sputum, and bilateral ear pain. Patient has hx of asthma. Patient reports shortness of breath in which she has had to use her inhaler more frequently.

## 2013-09-04 ENCOUNTER — Encounter (HOSPITAL_COMMUNITY): Payer: Self-pay | Admitting: Emergency Medicine

## 2013-09-04 ENCOUNTER — Emergency Department (HOSPITAL_COMMUNITY): Payer: Medicare Other

## 2013-09-04 ENCOUNTER — Emergency Department (HOSPITAL_COMMUNITY)
Admission: EM | Admit: 2013-09-04 | Discharge: 2013-09-04 | Disposition: A | Discharge status: Home / Self Care | Payer: Medicare Other | Attending: Emergency Medicine | Admitting: Emergency Medicine

## 2013-09-04 DIAGNOSIS — Z792 Long term (current) use of antibiotics: Secondary | ICD-10-CM | POA: Insufficient documentation

## 2013-09-04 DIAGNOSIS — I059 Rheumatic mitral valve disease, unspecified: Secondary | ICD-10-CM | POA: Insufficient documentation

## 2013-09-04 DIAGNOSIS — Z7982 Long term (current) use of aspirin: Secondary | ICD-10-CM | POA: Insufficient documentation

## 2013-09-04 DIAGNOSIS — Z8719 Personal history of other diseases of the digestive system: Secondary | ICD-10-CM | POA: Insufficient documentation

## 2013-09-04 DIAGNOSIS — H748X9 Other specified disorders of middle ear and mastoid, unspecified ear: Secondary | ICD-10-CM | POA: Insufficient documentation

## 2013-09-04 DIAGNOSIS — Z86718 Personal history of other venous thrombosis and embolism: Secondary | ICD-10-CM | POA: Insufficient documentation

## 2013-09-04 DIAGNOSIS — R52 Pain, unspecified: Secondary | ICD-10-CM | POA: Insufficient documentation

## 2013-09-04 DIAGNOSIS — J45901 Unspecified asthma with (acute) exacerbation: Secondary | ICD-10-CM | POA: Insufficient documentation

## 2013-09-04 DIAGNOSIS — F329 Major depressive disorder, single episode, unspecified: Secondary | ICD-10-CM | POA: Insufficient documentation

## 2013-09-04 DIAGNOSIS — H9209 Otalgia, unspecified ear: Secondary | ICD-10-CM

## 2013-09-04 DIAGNOSIS — J209 Acute bronchitis, unspecified: Secondary | ICD-10-CM

## 2013-09-04 DIAGNOSIS — J029 Acute pharyngitis, unspecified: Secondary | ICD-10-CM

## 2013-09-04 DIAGNOSIS — Z79899 Other long term (current) drug therapy: Secondary | ICD-10-CM | POA: Insufficient documentation

## 2013-09-04 DIAGNOSIS — Z8739 Personal history of other diseases of the musculoskeletal system and connective tissue: Secondary | ICD-10-CM | POA: Insufficient documentation

## 2013-09-04 DIAGNOSIS — Z8781 Personal history of (healed) traumatic fracture: Secondary | ICD-10-CM | POA: Insufficient documentation

## 2013-09-04 DIAGNOSIS — G894 Chronic pain syndrome: Secondary | ICD-10-CM | POA: Insufficient documentation

## 2013-09-04 DIAGNOSIS — F3289 Other specified depressive episodes: Secondary | ICD-10-CM | POA: Insufficient documentation

## 2013-09-04 DIAGNOSIS — G40909 Epilepsy, unspecified, not intractable, without status epilepticus: Secondary | ICD-10-CM | POA: Insufficient documentation

## 2013-09-04 DIAGNOSIS — Z8701 Personal history of pneumonia (recurrent): Secondary | ICD-10-CM | POA: Insufficient documentation

## 2013-09-04 DIAGNOSIS — F411 Generalized anxiety disorder: Secondary | ICD-10-CM | POA: Insufficient documentation

## 2013-09-04 MED ORDER — SALINE SPRAY 0.65 % NA SOLN: 1.0000 | NASAL | Status: DC | PRN

## 2013-09-04 MED ORDER — CLARITHROMYCIN 500 MG PO TABS: 500.0000 mg | ORAL_TABLET | Freq: Two times a day (BID) | ORAL | Status: DC

## 2013-09-04 MED ORDER — AZITHROMYCIN 250 MG PO TABS: 250.0000 mg | ORAL_TABLET | Freq: Every day | ORAL | Status: DC

## 2013-09-04 NOTE — ED Notes (Signed)
Pt reports that her grandchild was recently dx with something that mimics the flu, but is not the flu.  Pt now having flu like symptoms.

## 2013-09-04 NOTE — ED Notes (Signed)
Pt ambulate without distress. Pt stable.

## 2013-09-04 NOTE — ED Provider Notes (Signed)
Medical screening examination/treatment/procedure(s) were performed by non-physician practitioner and as supervising physician I was immediately available for consultation/collaboration.   EKG Interpretation None        Glynn OctaveStephen Caylan Chenard, MD 09/04/13 1730

## 2013-09-04 NOTE — ED Provider Notes (Signed)
CSN: [id number]     Arrival date & time 09/04/13  1002 History  This chart was scribed for Junius FinnerErin O'Malley, PA working with Glynn OctaveStephen Rancour, MD by Quintella ReichertMatthew Underwood, ED Scribe. This patient was seen in room TR06C/TR06C and the patient's care was started at 10:44 AM.   Chief Complaint  Patient presents with  . Cough  . Sore Throat  . Otalgia    The history is provided by the patient. No language interpreter was used.    HPI Comments: [name] is a 58 y.o. female with h/o asthma who presents to the Emergency Department complaining of 5 days of persistent worsening cough with associated fever, headache, photophobia, ear pain, generalized body aches, and sore throat.  Pt reports a temperature up to 102 F.  She has attempted to treat symptoms with ibuprofen, with relief of fever.  She also takes Zyrtec regularly.  She denies nausea, vomiting or diarrhea.  Pt notes that her grandchild was recently diagnosed with "something that's like the flu, that's not the flu, that's going around."  She also reports a prior h/o similar symptoms "when my sinuses drain and it causes me to get bronchitis."  She also reports h/o pneumonia.  She has h/o asthma.  She does not smoke.  Pt has Flonase and was using it earlier when her symptoms began, but she has not used it for the past 2 days.  She has an inhaler.   Past Medical History  Diagnosis Date  . Mitral valve prolapse   . Anxiety   . Depression     history of prior suicide attempts  . Allergic rhinitis   . Broken ribs     Trauma falling off of horse November 2012  . DVT (deep venous thrombosis)     remote at age 58  . Herniated nucleus pulposus, L4-5 left 2001    s/p Left microsurgical exploration L4-5 and microdiskectomy with lysis  . Hypotension     history of hypotension in the past requiring fluid boluses  . Chronic pain syndrome   . CVA (cerebral infarction)     reported by patient, no documentation  . Atypical chest pain     cath 2007  showing normal coronary arteries, last echo 2003 with normal EF and no systolic dysfunction  . Chest pain   . Stroke     hx of TIA  . Asthma   . Shortness of breath   . GERD (gastroesophageal reflux disease)   . Seizures   . Pneumonia 08/29/2011    Past Surgical History  Procedure Laterality Date  . Cervical spine surgery    . Right arm surgery    . Lower back surgery    . Cholescystectomy  2003     Laparoscopic cholecystectomy with intraoperative  . Appendectomy    . Tonsillectomy    . Abdominal hysterectomy    . Oophorectomy    . Nerve, tendon and artery repair Left 05/09/2013    Procedure: LEFT WRIST EXPLORATION ;  Surgeon: Tami RibasKevin R Kuzma, MD;  Location: [facility];  Service: Orthopedics;  Laterality: Left;    Family History  Problem Relation Age of Onset  . Heart attack Brother     CABG  . Hypertrophic cardiomyopathy Son   . Cancer Mother     Uterine  . Cancer Father     brain    History  Substance Use Topics  . Smoking status: Never Smoker   . Smokeless tobacco: Never Used  .  Alcohol Use: No    OB History   Grav Para Term Preterm Abortions TAB SAB Ect Mult Living                   Review of Systems  Constitutional: Positive for fever.  HENT: Positive for ear pain and sore throat.   Eyes: Positive for photophobia.  Respiratory: Positive for cough.   Gastrointestinal: Negative for nausea, vomiting and diarrhea.  Musculoskeletal: Positive for myalgias.  Neurological: Positive for headaches.  All other systems reviewed and are negative.     Allergies  Aspirin; Cephalexin; Ciprofloxacin; Darvocet; Dilaudid; Fentanyl; Heparin; Hydrocodone; Hydrocodone-acetaminophen; Hyoscyamine sulfate; Imipramine hcl; Indomethacin; Ketorolac tromethamine; Ketorolac tromethamine; Nortriptyline hcl; Nsaids; Oxycodone; Pentazocine lactate; Pentazocine-naloxone; Percocet; Sertraline hcl; and Tramadol hcl  Home Medications   Prior to Admission medications    Medication Sig Start Date End Date Taking? Authorizing Provider  albuterol (PROVENTIL HFA;VENTOLIN HFA) 108 (90 BASE) MCG/ACT inhaler Inhale 2 puffs into the lungs every 6 (six) hours as needed for wheezing. 08/30/11 08/29/12  Danley DankerBradley W Wainright, MD  aspirin EC 81 MG tablet Take 81 mg by mouth every evening.    Historical Provider, MD  azithromycin (ZITHROMAX Z-PAK) 250 MG tablet 2 po day one, then 1 daily x 4 days 06/06/13   Geoffery Lyonsouglas Delo, MD  cetirizine (ZYRTEC) 10 MG tablet Take 10 mg by mouth daily.    Historical Provider, MD  FLUoxetine (PROZAC) 20 MG tablet Take 60 mg by mouth every morning.    Historical Provider, MD  gabapentin (NEURONTIN) 300 MG capsule Take 900-1,800 mg by mouth 3 (three) times daily. Take 900 MG twice a day, and take 1800 MG at bedtime.    Historical Provider, MD  hydrOXYzine (VISTARIL) 25 MG capsule Take 1 capsule by mouth 3 (three) times daily. 04/20/13   Historical Provider, MD  LORazepam (ATIVAN) 1 MG tablet Take 1 mg by mouth 2 (two) times daily as needed for anxiety. For anxiety.    Historical Provider, MD  meperidine (DEMEROL) 50 MG tablet Take 50 mg by mouth daily as needed for moderate pain. For pain.    Historical Provider, MD  metoprolol succinate (TOPROL-XL) 25 MG 24 hr tablet Take 25 mg by mouth every evening.  01/14/11   Historical Provider, MD  omeprazole (PRILOSEC) 20 MG capsule Take 1 capsule by mouth 2 (two) times daily. 02/12/13   Historical Provider, MD  promethazine (PHENERGAN) 25 MG tablet Take 25 mg by mouth every 6 (six) hours as needed. For nausea. Takes with Demerol.    Historical Provider, MD   BP 124/82  Pulse 78  Temp(Src) 99 F (37.2 C) (Oral)  Resp 18  SpO2 96%  Physical Exam  Nursing note and vitals reviewed. Constitutional: She is oriented to person, place, and time. She appears well-developed and well-nourished.  HENT:  Head: Normocephalic and atraumatic.  Right Ear: No drainage or tenderness. Tympanic membrane is not erythematous  and not bulging. A middle ear effusion is present.  Left Ear: No drainage or tenderness. Tympanic membrane is not erythematous and not bulging. A middle ear effusion is present.  Mouth/Throat: Oropharynx is clear and moist. No oropharyngeal exudate.  Eyes: EOM are normal.  Neck: Normal range of motion.  Cardiovascular: Normal rate, regular rhythm and normal heart sounds.   No murmur heard. Pulmonary/Chest: Effort normal and breath sounds normal. No respiratory distress. She has no wheezes. She has no rales. She exhibits no tenderness.  Intermittent productive cough during exam with audible rhonchi.  No wheeze or respiratory distress.  Musculoskeletal: Normal range of motion.  Neurological: She is alert and oriented to person, place, and time.  Skin: Skin is warm and dry.  Psychiatric: She has a normal mood and affect. Her behavior is normal.    ED Course  Procedures (including critical care time)  DIAGNOSTIC STUDIES: Oxygen Saturation is 96% on room air, normal by my interpretation.    COORDINATION OF CARE: 10:52 AM-Discussed treatment plan which includes CXR with pt at bedside and pt agreed to plan.   11:49 AM-Informed pt that CXR is unconcerning.  Discussed treatment plan which includes antibiotics and saline spray with pt at bedside and pt agreed to plan.     Labs Review Labs Reviewed - No data to display   Imaging Review Dg Chest 2 View  09/04/2013   CLINICAL DATA:  Chest pain, cough, sore throat  EXAM: CHEST  2 VIEW  COMPARISON:  Prior chest x-ray 06/07/2011  FINDINGS: The lungs are clear and negative for focal airspace consolidation, pulmonary edema or suspicious pulmonary nodule. No pleural effusion or pneumothorax. Cardiac and mediastinal contours are within normal limits. No acute fracture or lytic or blastic osseous lesions. Incompletely imaged cervical spine stabilization hardware. The visualized upper abdominal bowel gas pattern is unremarkable. Surgical clips in the  right upper quadrant suggest prior cholecystectomy.  IMPRESSION: No active cardiopulmonary disease.   Electronically Signed   By: Malachy Moan M.D.   On: 09/04/2013 11:41     EKG Interpretation None      MDM   Final diagnoses:  Acute bronchitis  Acute ear pain  Sore throat    Pt with hx of bronchitis c/o flu-like symptoms including progressively worsening cough. CXR unremarkable, however due to pt's hx of reported bronchitis and pneumonia, will tx pt with antibiotics. Pt initially offered azithromycin, pt declined stating biaxin works better for her.  Pt also provided nasal saline spray to help keep nasal passages moist and open.  Pt requested pain medication for ears, reassured pt no evidence of AOM, however antibiotics, nasal saline and alternating tylenol and ibuprofen should help with ear pain. Discussed with pt ear drops would not be of benefit due to fluid buildup behind the ear.  Also advised narcotics are not indicated at this time. Advised to f/u with PCP for ongoing healthcare needs and recheck of symptoms if not improving by next week. Return precautions provided. Pt verbalized understanding and agreement with tx plan.   I personally performed the services described in this documentation, which was scribed in my presence. The recorded information has been reviewed and is accurate.    Junius Finner, PA-C 09/04/13 1651

## 2013-09-04 NOTE — Discharge Instructions (Signed)
Bronchitis °Bronchitis is swelling (inflammation) of the air tubes leading to your lungs (bronchi). This causes mucus and a cough. If the swelling gets bad, you may have trouble breathing. °HOME CARE  °· Rest. °· Drink enough fluids to keep your pee (urine) clear or pale yellow (unless you have a condition where you have to watch how much you drink). °· Only take medicine as told by your doctor. If you were given antibiotic medicines, finish them even if you start to feel better. °· Avoid smoke, irritating chemicals, and strong smells. These make the problem worse. Quit smoking if you smoke. This helps your lungs heal faster. °· Use a cool mist humidifier. Change the water in the humidifier every day. You can also sit in the bathroom with hot shower running for 5 10 minutes. Keep the door closed. °· See your health care provider as told. °· Wash your hands often. °GET HELP IF: °Your problems do not get better after 1 week. °GET HELP RIGHT AWAY IF:  °· Your fever gets worse. °· You have chills. °· Your chest hurts. °· Your problems breathing get worse. °· You have blood in your mucus. °· You pass out (faint). °· You feel lightheaded. °· You have a bad headache. °· You throw up (vomit) again and again. °MAKE SURE YOU: °· Understand these instructions. °· Will watch your condition. °· Will get help right away if you are not doing well or get worse. °Document Released: 10/11/2007 Document Revised: 02/12/2013 Document Reviewed: 12/17/2012 °[employer]® Patient Information ©2014 [employer]. ° °

## 2014-03-17 ENCOUNTER — Other Ambulatory Visit (HOSPITAL_COMMUNITY): Payer: Self-pay | Admitting: Family Medicine

## 2014-03-17 DIAGNOSIS — Z1231 Encounter for screening mammogram for malignant neoplasm of breast: Secondary | ICD-10-CM

## 2014-03-20 ENCOUNTER — Ambulatory Visit (HOSPITAL_COMMUNITY)
Admission: RE | Admit: 2014-03-20 | Discharge: 2014-03-20 | Disposition: A | Discharge status: Home / Self Care | Payer: Medicare Other | Source: Ambulatory Visit | Attending: Family Medicine | Admitting: Family Medicine

## 2014-03-20 DIAGNOSIS — Z1231 Encounter for screening mammogram for malignant neoplasm of breast: Secondary | ICD-10-CM | POA: Insufficient documentation

## 2014-06-23 ENCOUNTER — Ambulatory Visit (INDEPENDENT_AMBULATORY_CARE_PROVIDER_SITE_OTHER): Payer: Medicare Other | Admitting: General Surgery

## 2014-06-23 ENCOUNTER — Encounter: Payer: Self-pay | Admitting: General Surgery

## 2014-06-23 VITALS — BP 122/72 | HR 92 | Temp 96.2°F | Resp 16 | Ht 61.75 in | Wt 192.0 lb

## 2014-06-23 DIAGNOSIS — L988 Other specified disorders of the skin and subcutaneous tissue: Secondary | ICD-10-CM

## 2014-06-23 DIAGNOSIS — N649 Disorder of breast, unspecified: Secondary | ICD-10-CM

## 2014-06-23 NOTE — Progress Notes (Signed)
Patient ID: [name], female   DOB: Sep 07, 1955, 59 y.o.   MRN: [id number]  Chief Complaint  Patient presents with  . Other    right breast pain    HPI [name] is a 59 y.o. who presents for an evaluation of right breast pain, redness, swelling and fever. She states she has been going on for approximately 6 months ago. Her most recent mammogram was done on 03/20/14. She has a strong family history of breast cancer on her maternal side. She is currently on antibiotics for the last week. She has not taken her dosage today because she suspects a yeast infection.   HPI  Past Medical History  Diagnosis Date  . Mitral valve prolapse   . Anxiety   . Depression     history of prior suicide attempts  . Allergic rhinitis   . Broken ribs     Trauma falling off of horse November 2012  . DVT (deep venous thrombosis)     remote at age 69  . Herniated nucleus pulposus, L4-5 left 2001    s/p Left microsurgical exploration L4-5 and microdiskectomy with lysis  . Hypotension     history of hypotension in the past requiring fluid boluses  . Chronic pain syndrome   . CVA (cerebral infarction)     reported by patient, no documentation  . Atypical chest pain     cath 2007 showing normal coronary arteries, last echo 2003 with normal EF and no systolic dysfunction  . Chest pain   . Stroke     hx of TIA  . Asthma   . Shortness of breath   . GERD (gastroesophageal reflux disease)   . Seizures   . Pneumonia 08/29/2011    Past Surgical History  Procedure Laterality Date  . Cervical spine surgery    . Right arm surgery    . Lower back surgery    . Cholescystectomy  2003     Laparoscopic cholecystectomy with intraoperative  . Appendectomy    . Tonsillectomy    . Abdominal hysterectomy    . Oophorectomy    . Nerve, tendon and artery repair Left 05/09/2013    Procedure: LEFT WRIST EXPLORATION ;  Surgeon: Tami Ribas, MD;  Location: [facility];  Service: Orthopedics;   Laterality: Left;    Family History  Problem Relation Age of Onset  . Heart attack Brother     CABG  . Hypertrophic cardiomyopathy Son   . Cancer Mother     Uterine  . Cancer Father     brain  . Cancer Maternal Aunt     cancer  . Cancer Maternal Aunt     breast  . Cancer Maternal Aunt     breast  . Cancer Cousin     breast  . Cancer Cousin     breast  . Cancer Cousin     breast    Social History History  Substance Use Topics  . Smoking status: Never Smoker   . Smokeless tobacco: Never Used  . Alcohol Use: No    Allergies  Allergen Reactions  . Aspirin     REACTION: ulcer and GI bleeding. Patient states she can take the  aspirin but no the .  . Cephalexin     REACTION: Hives  . Ciprofloxacin     REACTION: Throat swells shut  . Darvocet [Propoxyphene N-Acetaminophen]     REACTION: Vomitting  . Dilaudid [Hydromorphone Hcl] Itching, Nausea And Vomiting  and Swelling  . Fentanyl Nausea And Vomiting and Other (See Comments)    Welts   . Heparin     MAKES HEART STOP BEATING.   Marland Kitchen Hydrocodone     REACTION: vomiting  . Hydrocodone-Acetaminophen     REACTION: Vomitting  . Hyoscyamine Sulfate     REACTION: Not sure  . Imipramine Hcl     REACTION: Itching  . Indomethacin     MAKES HEART STOP BEATING.   Marland Kitchen Ketorolac Tromethamine     REACTION: Vomitting  . Ketorolac Tromethamine     unknown  . Nortriptyline Hcl     REACTION: Hyperactive  . Nsaids     REACTION: Stomach bleeding  . Oxycodone   . Pentazocine Lactate     unknown  . Pentazocine-Naloxone     REACTION: Vomitting  . Percocet [Oxycodone-Acetaminophen]     REACTION: Vomitting  . Sertraline Hcl     REACTION: Hyperactive - bounce off the wall  . Tramadol Hcl     REACTION: Vomitting    Current Outpatient Prescriptions  Medication Sig Dispense Refill  . amoxicillin-clavulanate (AUGMENTIN) 500-125 MG per tablet Take 1 tablet by mouth 3 (three) times daily.    Marland Kitchen aspirin EC 81 MG tablet Take  81 mg by mouth every evening.    . cetirizine (ZYRTEC) 10 MG tablet Take 10 mg by mouth daily.    . chlorpheniramine-HYDROcodone (TUSSIONEX) 10-8 MG/5ML LQCR Take 5 mLs by mouth.    Marland Kitchen FLUoxetine (PROZAC) 20 MG tablet Take 60 mg by mouth every morning.    . gabapentin (NEURONTIN) 300 MG capsule Take 900-1,800 mg by mouth 3 (three) times daily. Take 900 MG twice a day, and take 1800 MG at bedtime.    . hydrOXYzine (VISTARIL) 25 MG capsule Take 1 capsule by mouth 3 (three) times daily.    Marland Kitchen ibuprofen (ADVIL,MOTRIN) 200 MG tablet Take 200 mg by mouth every 6 (six) hours as needed.    Marland Kitchen levothyroxine (SYNTHROID, LEVOTHROID) 25 MCG tablet Take 25 mcg by mouth daily before breakfast.    . LORazepam (ATIVAN) 1 MG tablet Take 1 mg by mouth 2 (two) times daily as needed for anxiety. For anxiety.    . meperidine (DEMEROL) 50 MG tablet Take 50 mg by mouth at bedtime as needed for moderate pain. For pain.    . metoprolol succinate (TOPROL-XL) 25 MG 24 hr tablet Take 25 mg by mouth every evening.     Marland Kitchen omeprazole (PRILOSEC) 20 MG capsule Take 1 capsule by mouth 2 (two) times daily.    . promethazine (PHENERGAN) 25 MG tablet Take 25 mg by mouth every 6 (six) hours as needed. For nausea. Takes with Demerol.    . valACYclovir (VALTREX) 500 MG tablet Take 500 mg by mouth 2 (two) times daily.    Marland Kitchen albuterol (PROVENTIL HFA;VENTOLIN HFA) 108 (90 BASE) MCG/ACT inhaler Inhale 2 puffs into the lungs every 6 (six) hours as needed for wheezing. 1 Inhaler 0   No current facility-administered medications for this visit.    Review of Systems Review of Systems  Constitutional: Negative.   Respiratory: Negative.   Cardiovascular: Negative.     Blood pressure 122/72, pulse 92, temperature 96.2 F (35.7 C), temperature source Oral, resp. rate 16, height 5' 1.75" (1.568 m), weight 192 lb (87.091 kg).  Physical Exam Physical Exam  Constitutional: She is oriented to person, place, and time. She appears well-developed and  well-nourished.  Eyes: Conjunctivae are normal. No scleral icterus.  Neck:  Neck supple. No thyromegaly present.  Cardiovascular: Normal rate, regular rhythm and normal heart sounds.   No murmur heard. Pulmonary/Chest: Effort normal and breath sounds normal. Right breast exhibits skin change. Right breast exhibits no inverted nipple, no mass, no nipple discharge and no tenderness. Left breast exhibits no inverted nipple, no mass, no nipple discharge, no skin change and no tenderness.    Lymphadenopathy:    She has no cervical adenopathy.    She has no axillary adenopathy.  Neurological: She is alert and oriented to person, place, and time.  Skin: Skin is warm and dry.    Data Reviewed Mammogram from Nov 2015- normal.  Assessment    Findings suggest a primary dermatologic process. No palpable breast findings Punch biopsy may help in this situation. Pt is agreeable.     Plan    Punch biopsy skin of right breast completed with pt consent.  Area prepped with Chloro Prep, 1ml 1%xylocaine instilled. Core biopsy performed. Bleeding was minimal and controlled with pressure. Dresses with neosporin and gauze. Further plan based on path report.         SANKAR,SEEPLAPUTHUR G 06/24/2014, 11:54 AM

## 2014-06-23 NOTE — Patient Instructions (Signed)
The patient is aware to call back for any questions or concerns.  

## 2014-06-24 ENCOUNTER — Encounter: Payer: Self-pay | Admitting: General Surgery

## 2014-06-30 ENCOUNTER — Telehealth: Payer: Self-pay | Admitting: General Surgery

## 2014-06-30 NOTE — Telephone Encounter (Signed)
Path report on punch biopsy from right breast skin showed benign epidermal hyperplasia. No malignancy noted. Pt advised. Appears this is best assessed by a dermatologist. Pt is agreeable.

## 2014-07-01 ENCOUNTER — Telehealth: Payer: Self-pay

## 2014-07-01 NOTE — Telephone Encounter (Signed)
-----   Message from Kieth BrightlySeeplaputhur G Sankar, MD sent at 06/30/2014 12:44 PM EST -----    Please arrange a dermatology consult with Dr. Adolphus Birchwoodasher or Dr. Roseanne KaufmanIsenstein. Send office note and pathology report.   Spoke with patient about referral to [facility]. She is scheduled to see Dr Roseanne KaufmanIsenstein on 07/03/14 at 10:30 am. Patient is aware of date and time. All information has been faxed to them.

## 2015-02-15 ENCOUNTER — Emergency Department (HOSPITAL_COMMUNITY)
Admission: EM | Admit: 2015-02-15 | Discharge: 2015-02-15 | Disposition: A | Discharge status: Home / Self Care | Payer: Medicare Other | Attending: Emergency Medicine | Admitting: Emergency Medicine

## 2015-02-15 ENCOUNTER — Emergency Department (HOSPITAL_COMMUNITY): Payer: Medicare Other

## 2015-02-15 ENCOUNTER — Encounter (HOSPITAL_COMMUNITY): Payer: Self-pay | Admitting: Emergency Medicine

## 2015-02-15 DIAGNOSIS — F329 Major depressive disorder, single episode, unspecified: Secondary | ICD-10-CM | POA: Diagnosis not present

## 2015-02-15 DIAGNOSIS — R51 Headache: Secondary | ICD-10-CM | POA: Diagnosis not present

## 2015-02-15 DIAGNOSIS — M542 Cervicalgia: Secondary | ICD-10-CM

## 2015-02-15 DIAGNOSIS — K219 Gastro-esophageal reflux disease without esophagitis: Secondary | ICD-10-CM | POA: Insufficient documentation

## 2015-02-15 DIAGNOSIS — G894 Chronic pain syndrome: Secondary | ICD-10-CM | POA: Diagnosis not present

## 2015-02-15 DIAGNOSIS — F419 Anxiety disorder, unspecified: Secondary | ICD-10-CM | POA: Diagnosis not present

## 2015-02-15 DIAGNOSIS — R519 Headache, unspecified: Secondary | ICD-10-CM

## 2015-02-15 DIAGNOSIS — Z79899 Other long term (current) drug therapy: Secondary | ICD-10-CM | POA: Diagnosis not present

## 2015-02-15 DIAGNOSIS — Z7982 Long term (current) use of aspirin: Secondary | ICD-10-CM | POA: Diagnosis not present

## 2015-02-15 DIAGNOSIS — J45909 Unspecified asthma, uncomplicated: Secondary | ICD-10-CM | POA: Insufficient documentation

## 2015-02-15 DIAGNOSIS — Z8701 Personal history of pneumonia (recurrent): Secondary | ICD-10-CM | POA: Insufficient documentation

## 2015-02-15 DIAGNOSIS — R0602 Shortness of breath: Secondary | ICD-10-CM | POA: Diagnosis present

## 2015-02-15 DIAGNOSIS — Z8673 Personal history of transient ischemic attack (TIA), and cerebral infarction without residual deficits: Secondary | ICD-10-CM | POA: Diagnosis not present

## 2015-02-15 DIAGNOSIS — Z86718 Personal history of other venous thrombosis and embolism: Secondary | ICD-10-CM | POA: Diagnosis not present

## 2015-02-15 DIAGNOSIS — R0789 Other chest pain: Secondary | ICD-10-CM | POA: Diagnosis not present

## 2015-02-15 LAB — CBC
HCT: 43.2 % (ref 36.0–46.0)
Hemoglobin: 14 g/dL (ref 12.0–15.0)
MCH: 28.9 pg (ref 26.0–34.0)
MCHC: 32.4 g/dL (ref 30.0–36.0)
MCV: 89.3 fL (ref 78.0–100.0)
PLATELETS: 325 10*3/uL (ref 150–400)
RBC: 4.84 MIL/uL (ref 3.87–5.11)
RDW: 12.8 % (ref 11.5–15.5)
WBC: 11.8 10*3/uL — AB (ref 4.0–10.5)

## 2015-02-15 LAB — BASIC METABOLIC PANEL
Anion gap: 8 (ref 5–15)
BUN: 8 mg/dL (ref 6–20)
CO2: 30 mmol/L (ref 22–32)
CREATININE: 0.94 mg/dL (ref 0.44–1.00)
Calcium: 9.8 mg/dL (ref 8.9–10.3)
Chloride: 101 mmol/L (ref 101–111)
Glucose, Bld: 126 mg/dL — ABNORMAL HIGH (ref 65–99)
Potassium: 4.7 mmol/L (ref 3.5–5.1)
SODIUM: 139 mmol/L (ref 135–145)

## 2015-02-15 LAB — D-DIMER, QUANTITATIVE (NOT AT ARMC)

## 2015-02-15 LAB — TROPONIN I: Troponin I: <0.03 ng/mL (ref <0.031)

## 2015-02-15 LAB — I-STAT TROPONIN, ED: TROPONIN I, POC: 0 ng/mL (ref 0.00–0.08)

## 2015-02-15 MED ORDER — SUCRALFATE 1 GM/10ML PO SUSP
1.0000 g | Freq: Three times a day (TID) | ORAL | Status: DC
  Administered 2015-02-15: 1 g via ORAL
  Filled 2015-02-15 (×2): qty 10

## 2015-02-15 MED ORDER — SUCRALFATE 1 GM/10ML PO SUSP: 1.0000 g | Freq: Three times a day (TID) | ORAL | Status: DC

## 2015-02-15 MED ORDER — MORPHINE SULFATE (PF) 4 MG/ML IV SOLN
4.0000 mg | Freq: Once | INTRAVENOUS | Status: AC
Start: 2015-02-15 — End: 2015-02-15
  Administered 2015-02-15: 4 mg via INTRAVENOUS
  Filled 2015-02-15: qty 1

## 2015-02-15 MED ORDER — OMEPRAZOLE 20 MG PO CPDR: 20.0000 mg | DELAYED_RELEASE_CAPSULE | Freq: Two times a day (BID) | ORAL | Status: DC

## 2015-02-15 MED ORDER — METHYLPREDNISOLONE SODIUM SUCC 125 MG IJ SOLR: 60.0000 mg | Freq: Once | INTRAMUSCULAR | Status: DC

## 2015-02-15 MED ORDER — MORPHINE SULFATE (PF) 2 MG/ML IV SOLN
2.0000 mg | Freq: Once | INTRAVENOUS | Status: AC
  Administered 2015-02-15: 2 mg via INTRAVENOUS
  Filled 2015-02-15: qty 1

## 2015-02-15 MED ORDER — IOHEXOL 350 MG/ML SOLN
50.0000 mL | Freq: Once | INTRAVENOUS | Status: AC | PRN
  Administered 2015-02-15: 50 mL via INTRAVENOUS

## 2015-02-15 MED ORDER — MORPHINE SULFATE (PF) 2 MG/ML IV SOLN
2.0000 mg | Freq: Once | INTRAVENOUS | Status: DC
  Filled 2015-02-15: qty 1

## 2015-02-15 MED ORDER — IBUPROFEN 800 MG PO TABS
800.0000 mg | ORAL_TABLET | Freq: Once | ORAL | Status: AC
  Administered 2015-02-15: 800 mg via ORAL
  Filled 2015-02-15: qty 1

## 2015-02-15 NOTE — ED Notes (Signed)
MD at bedside. 

## 2015-02-15 NOTE — Discharge Instructions (Signed)
As discussed, your evaluation today has been largely reassuring.  But, it is important that you monitor your condition carefully, and do not hesitate to return to the ED if you develop new, or concerning changes in your condition. ° °Otherwise, please follow-up with your physicians for appropriate ongoing care. ° °

## 2015-02-15 NOTE — ED Provider Notes (Signed)
CSN: [id number]     Arrival date & time 02/15/15  1244 History   First MD Initiated Contact with Patient 02/15/15 1542     Chief Complaint  Patient presents with  . Shortness of Breath  . Neck Pain     HPI  Patient p/w new CP, neck pain and LH / syncope. Sx began two days ago w/o precipitant.  Since onset the CP has been persistent, radiating up the right neck w burning, sharp sensation.  With R lateral neck rotation, there is new LH, and today two episodes of syncope.  There is associated R sided HA (diffuse). No relief w OTC meds. No new meds / diet / activity.  Patient does not smoke.  No trauma following either episode.   Past Medical History  Diagnosis Date  . Mitral valve prolapse   . Anxiety   . Depression     history of prior suicide attempts  . Allergic rhinitis   . Broken ribs     Trauma falling off of horse November 2012  . DVT (deep venous thrombosis) (HCC)     remote at age 51  . Herniated nucleus pulposus, L4-5 left 2001    s/p Left microsurgical exploration L4-5 and microdiskectomy with lysis  . Hypotension     history of hypotension in the past requiring fluid boluses  . Chronic pain syndrome   . CVA (cerebral infarction)     reported by patient, no documentation  . Atypical chest pain     cath 2007 showing normal coronary arteries, last echo 2003 with normal EF and no systolic dysfunction  . Chest pain   . Stroke (HCC)     hx of TIA  . Asthma   . Shortness of breath   . GERD (gastroesophageal reflux disease)   . Seizures (HCC)   . Pneumonia 08/29/2011   Past Surgical History  Procedure Laterality Date  . Cervical spine surgery    . Right arm surgery    . Lower back surgery    . Cholescystectomy  2003     Laparoscopic cholecystectomy with intraoperative  . Appendectomy    . Tonsillectomy    . Abdominal hysterectomy    . Oophorectomy    . Nerve, tendon and artery repair Left 05/09/2013    Procedure: LEFT WRIST EXPLORATION ;  Surgeon: Tami Ribas, MD;  Location: [facility];  Service: Orthopedics;  Laterality: Left;   Family History  Problem Relation Age of Onset  . Heart attack Brother     CABG  . Hypertrophic cardiomyopathy Son   . Cancer Mother     Uterine  . Cancer Father     brain  . Cancer Maternal Aunt     cancer  . Cancer Maternal Aunt     breast  . Cancer Maternal Aunt     breast  . Cancer Cousin     breast  . Cancer Cousin     breast  . Cancer Cousin     breast   Social History  Substance Use Topics  . Smoking status: Never Smoker   . Smokeless tobacco: Never Used  . Alcohol Use: No   OB History    No data available     Review of Systems  Constitutional:       Per HPI, otherwise negative  HENT:       Per HPI, otherwise negative  Respiratory:       Per HPI, otherwise negative  Cardiovascular:  Per HPI, otherwise negative  Gastrointestinal: Negative for vomiting.  Endocrine:       Negative aside from HPI  Genitourinary:       Neg aside from HPI   Musculoskeletal:       Per HPI, otherwise negative  Skin: Negative.   Neurological: Positive for syncope.      Allergies  Aspirin; Cephalexin; Ciprofloxacin; Darvocet; Dilaudid; Fentanyl; Heparin; Hydrocodone; Hydrocodone-acetaminophen; Hyoscyamine sulfate; Imipramine hcl; Indomethacin; Ketorolac tromethamine; Ketorolac tromethamine; Nortriptyline hcl; Nsaids; Oxycodone; Pentazocine lactate; Pentazocine-naloxone; Percocet; Sertraline hcl; and Tramadol hcl  Home Medications   Prior to Admission medications   Medication Sig Start Date End Date Taking? Authorizing Provider  albuterol (PROVENTIL HFA;VENTOLIN HFA) 108 (90 BASE) MCG/ACT inhaler Inhale 2 puffs into the lungs every 6 (six) hours as needed for wheezing. 08/30/11 08/29/12  Danley Danker, MD  amoxicillin-clavulanate (AUGMENTIN) 500-125 MG per tablet Take 1 tablet by mouth 3 (three) times daily.    Historical Provider, MD  aspirin EC 81 MG tablet Take 81 mg  by mouth every evening.    Historical Provider, MD  cetirizine (ZYRTEC) 10 MG tablet Take 10 mg by mouth daily.    Historical Provider, MD  chlorpheniramine-HYDROcodone (TUSSIONEX) 10-8 MG/5ML LQCR Take 5 mLs by mouth.    Historical Provider, MD  FLUoxetine (PROZAC) 20 MG tablet Take 60 mg by mouth every morning.    Historical Provider, MD  gabapentin (NEURONTIN) 300 MG capsule Take 900-1,800 mg by mouth 3 (three) times daily. Take 900 MG twice a day, and take 1800 MG at bedtime.    Historical Provider, MD  hydrOXYzine (VISTARIL) 25 MG capsule Take 1 capsule by mouth 3 (three) times daily. 04/20/13   Historical Provider, MD  ibuprofen (ADVIL,MOTRIN) 200 MG tablet Take 200 mg by mouth every 6 (six) hours as needed.    Historical Provider, MD  levothyroxine (SYNTHROID, LEVOTHROID) 25 MCG tablet Take 25 mcg by mouth daily before breakfast.    Historical Provider, MD  LORazepam (ATIVAN) 1 MG tablet Take 1 mg by mouth 2 (two) times daily as needed for anxiety. For anxiety.    Historical Provider, MD  meperidine (DEMEROL) 50 MG tablet Take 50 mg by mouth at bedtime as needed for moderate pain. For pain.    Historical Provider, MD  metoprolol succinate (TOPROL-XL) 25 MG 24 hr tablet Take 25 mg by mouth every evening.  01/14/11   Historical Provider, MD  omeprazole (PRILOSEC) 20 MG capsule Take 1 capsule by mouth 2 (two) times daily. 02/12/13   Historical Provider, MD  promethazine (PHENERGAN) 25 MG tablet Take 25 mg by mouth every 6 (six) hours as needed. For nausea. Takes with Demerol.    Historical Provider, MD  valACYclovir (VALTREX) 500 MG tablet Take 500 mg by mouth 2 (two) times daily.    Historical Provider, MD   BP 122/89 mmHg  Pulse 96  Temp(Src) 99.4 F (37.4 C) (Oral)  Resp 18  SpO2 95% Physical Exam  Constitutional: She is oriented to person, place, and time. She appears well-developed and well-nourished. No distress.  HENT:  Head: Normocephalic and atraumatic.  edentulous  Eyes:  Conjunctivae and EOM are normal.  Neck: Neck supple. No tracheal deviation present. No thyromegaly present.  Cardiovascular: Normal rate and regular rhythm.   Pulmonary/Chest: Effort normal and breath sounds normal. No stridor. No respiratory distress.  Abdominal: She exhibits no distension.  Musculoskeletal: She exhibits no edema.  Neurological: She is alert and oriented to person, place, and time. No cranial nerve  deficit.  Skin: Skin is warm and dry.  Psychiatric: She has a normal mood and affect.  Nursing note and vitals reviewed.   ED Course  Procedures (including critical care time) Labs Review Labs Reviewed  BASIC METABOLIC PANEL - Abnormal; Notable for the following:    Glucose, Bld 126 (*)    All other components within normal limits  CBC - Abnormal; Notable for the following:    WBC 11.8 (*)    All other components within normal limits  D-DIMER, QUANTITATIVE (NOT AT [facility])  TROPONIN I  Rosezena Sensor, ED    Imaging Review Dg Chest 2 View  02/15/2015   CLINICAL DATA:  Syncope and hypotension for 3 days  EXAM: CHEST  2 VIEW  COMPARISON:  September 04, 2013  FINDINGS: No edema or consolidation. Heart size and pulmonary vascularity are normal. No adenopathy. There is postoperative change in the lower cervical spine region.  IMPRESSION: No edema or consolidation.   Electronically Signed   By: Bretta Bang III M.D.   On: 02/15/2015 14:33   Ct Angio Neck W/cm &/or Wo/cm  02/15/2015   CLINICAL DATA:  Syncope. Right neck pain. Rule out carotid dissection.  EXAM: CT ANGIOGRAPHY NECK  TECHNIQUE: Multidetector CT imaging of the neck was performed using the standard protocol during bolus administration of intravenous contrast. Multiplanar CT image reconstructions and MIPs were obtained to evaluate the vascular anatomy. Carotid stenosis measurements (when applicable) are obtained utilizing NASCET criteria, using the distal internal carotid diameter as the denominator.  CONTRAST:  50mL  OMNIPAQUE IOHEXOL 350 MG/ML SOLN  COMPARISON:  CT chest 08/29/2011  FINDINGS: Aortic arch: Minimal atherosclerotic disease in the aortic arch. No aneurysm or dissection. Proximal great vessels patent.  Mild ground-glass density in the central lung bilaterally. This was not present on the prior CT and may represent edema or infection.  Right carotid system: Right common carotid artery normal. Atherosclerotic calcification in the carotid bulb without significant luminal narrowing. Negative for carotid dissection or aneurysm.  Left carotid system: Left common carotid artery normal. Mild atherosclerotic disease of the carotid bifurcation without significant stenosis. Negative for dissection.  Vertebral arteries:Both vertebral arteries patent to the basilar without stenosis or dissection.  Skeleton: Solid fusion C4-5. ACDF C4 through C6. 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 No acute skeletal abnormality. Posterior hardware fusion C2 through C4 bilaterally.  Other neck: Negative for mass or adenopathy in the neck.  IMPRESSION: Negative for carotid or vertebral dissection  Mild atherosclerotic disease in the carotid bifurcation bilaterally without significant stenosis. Both vertebral arteries widely patent.  Cervical surgery as above.  Probable pseudarthrosis at C5-6.  Ground-glass density in both lung apices, possible pulmonary edema.   Electronically Signed   By: Marlan Palau M.D.   On: 02/15/2015 20:21   I have personally reviewed and evaluated these images and lab results as part of my medical decision-making.   EKG Interpretation   Date/Time:  Monday February 15 2015 15:56:49 EDT Ventricular Rate:  92 PR Interval:  184 QRS Duration: 65 QT Interval:  330 QTC Calculation: 408 R Axis:   73 Text Interpretation:  Sinus rhythm Left atrial enlargement Minimal ST  elevation, inferior leads Sinus rhythm Artifact Abnormal ekg Confirmed by  Gerhard Munch  MD ([id number]) on 02/15/2015 4:15:19 PM     Cardiac: 90 sr,  nml  O2- 99%ra ,nml  11:14 PM Patient ambulating, in no distress. We discussed all findings at length, including consideration of gastroesophageal etiology for her chest discomfort, and acute on  chronic neck discomfort, possibly related to her prior orthopedic procedures. Patient now specifies that she has known loose hardware in her neck, but has not yet followed up with new orthopedist, and her neurosurgeon has died.  MDM  Patient presents with multiple complaints, but initially, primarily is concern of new acute right-sided neck pain, chest pain, headache, and syncope with head rotation. Given her description, there is some concern for dissection. Imaging studies are reassuring, labs, including serial troponin reassuring, and there is low suspicion for acute new vascular compromise. No evidence for infection. No evidence for pulmonary embolism. Patient did improve here, was discharged with medication for presumed gastroesophageal etiology for her chest pain, encouraged to continue taking her previously prescribed narcotics for her neck pain. Patient will follow-up with both cardiology and neurosurgery.   Gerhard Munch, MD 02/15/15 [id number]

## 2015-02-15 NOTE — ED Notes (Signed)
Pt left with all belongings and ambulated out of treatment area.  

## 2015-02-15 NOTE — ED Notes (Signed)
Pt sts SOB worse with inspiration and body aches with pain in neck x 2 days

## 2015-04-12 ENCOUNTER — Emergency Department (HOSPITAL_COMMUNITY): Payer: Medicare Other

## 2015-04-12 ENCOUNTER — Emergency Department (HOSPITAL_COMMUNITY)
Admission: EM | Admit: 2015-04-12 | Discharge: 2015-04-12 | Disposition: A | Discharge status: Home / Self Care | Payer: Medicare Other | Attending: Emergency Medicine | Admitting: Emergency Medicine

## 2015-04-12 ENCOUNTER — Encounter (HOSPITAL_COMMUNITY): Payer: Self-pay | Admitting: Emergency Medicine

## 2015-04-12 DIAGNOSIS — Z8701 Personal history of pneumonia (recurrent): Secondary | ICD-10-CM | POA: Insufficient documentation

## 2015-04-12 DIAGNOSIS — K219 Gastro-esophageal reflux disease without esophagitis: Secondary | ICD-10-CM | POA: Diagnosis not present

## 2015-04-12 DIAGNOSIS — G43909 Migraine, unspecified, not intractable, without status migrainosus: Secondary | ICD-10-CM | POA: Diagnosis not present

## 2015-04-12 DIAGNOSIS — Z880 Allergy status to penicillin: Secondary | ICD-10-CM | POA: Insufficient documentation

## 2015-04-12 DIAGNOSIS — S8991XA Unspecified injury of right lower leg, initial encounter: Secondary | ICD-10-CM | POA: Diagnosis not present

## 2015-04-12 DIAGNOSIS — Y92410 Unspecified street and highway as the place of occurrence of the external cause: Secondary | ICD-10-CM | POA: Insufficient documentation

## 2015-04-12 DIAGNOSIS — F419 Anxiety disorder, unspecified: Secondary | ICD-10-CM | POA: Insufficient documentation

## 2015-04-12 DIAGNOSIS — Y998 Other external cause status: Secondary | ICD-10-CM | POA: Diagnosis not present

## 2015-04-12 DIAGNOSIS — G894 Chronic pain syndrome: Secondary | ICD-10-CM | POA: Diagnosis not present

## 2015-04-12 DIAGNOSIS — J45901 Unspecified asthma with (acute) exacerbation: Secondary | ICD-10-CM | POA: Diagnosis not present

## 2015-04-12 DIAGNOSIS — Y9389 Activity, other specified: Secondary | ICD-10-CM | POA: Insufficient documentation

## 2015-04-12 DIAGNOSIS — Z79899 Other long term (current) drug therapy: Secondary | ICD-10-CM | POA: Diagnosis not present

## 2015-04-12 DIAGNOSIS — S3992XA Unspecified injury of lower back, initial encounter: Secondary | ICD-10-CM | POA: Insufficient documentation

## 2015-04-12 DIAGNOSIS — S4991XA Unspecified injury of right shoulder and upper arm, initial encounter: Secondary | ICD-10-CM | POA: Diagnosis not present

## 2015-04-12 DIAGNOSIS — R531 Weakness: Secondary | ICD-10-CM

## 2015-04-12 DIAGNOSIS — Z86718 Personal history of other venous thrombosis and embolism: Secondary | ICD-10-CM | POA: Diagnosis not present

## 2015-04-12 DIAGNOSIS — Z8781 Personal history of (healed) traumatic fracture: Secondary | ICD-10-CM | POA: Diagnosis not present

## 2015-04-12 DIAGNOSIS — S199XXA Unspecified injury of neck, initial encounter: Secondary | ICD-10-CM | POA: Diagnosis not present

## 2015-04-12 DIAGNOSIS — Z8673 Personal history of transient ischemic attack (TIA), and cerebral infarction without residual deficits: Secondary | ICD-10-CM | POA: Diagnosis not present

## 2015-04-12 DIAGNOSIS — F329 Major depressive disorder, single episode, unspecified: Secondary | ICD-10-CM | POA: Diagnosis not present

## 2015-04-12 DIAGNOSIS — S299XXA Unspecified injury of thorax, initial encounter: Secondary | ICD-10-CM | POA: Insufficient documentation

## 2015-04-12 DIAGNOSIS — M797 Fibromyalgia: Secondary | ICD-10-CM | POA: Insufficient documentation

## 2015-04-12 DIAGNOSIS — M6281 Muscle weakness (generalized): Secondary | ICD-10-CM | POA: Diagnosis not present

## 2015-04-12 DIAGNOSIS — Z7982 Long term (current) use of aspirin: Secondary | ICD-10-CM | POA: Insufficient documentation

## 2015-04-12 DIAGNOSIS — S0990XA Unspecified injury of head, initial encounter: Secondary | ICD-10-CM | POA: Diagnosis not present

## 2015-04-12 DIAGNOSIS — S3991XA Unspecified injury of abdomen, initial encounter: Secondary | ICD-10-CM | POA: Diagnosis present

## 2015-04-12 LAB — COMPREHENSIVE METABOLIC PANEL
ALBUMIN: 3.7 g/dL (ref 3.5–5.0)
ALT: 26 U/L (ref 14–54)
AST: 27 U/L (ref 15–41)
Alkaline Phosphatase: 83 U/L (ref 38–126)
Anion gap: 5 (ref 5–15)
BILIRUBIN TOTAL: 0.3 mg/dL (ref 0.3–1.2)
BUN: 7 mg/dL (ref 6–20)
CO2: 29 mmol/L (ref 22–32)
Calcium: 9.1 mg/dL (ref 8.9–10.3)
Chloride: 105 mmol/L (ref 101–111)
Creatinine, Ser: 0.84 mg/dL (ref 0.44–1.00)
GFR calc Af Amer: >60 mL/min (ref >60)
GFR calc non Af Amer: >60 mL/min (ref >60)
GLUCOSE: 85 mg/dL (ref 65–99)
POTASSIUM: 4.1 mmol/L (ref 3.5–5.1)
SODIUM: 139 mmol/L (ref 135–145)
TOTAL PROTEIN: 6.5 g/dL (ref 6.5–8.1)

## 2015-04-12 LAB — CBC WITH DIFFERENTIAL/PLATELET
BASOS ABS: 0 10*3/uL (ref 0.0–0.1)
BASOS PCT: 0 %
EOS ABS: 0.1 10*3/uL (ref 0.0–0.7)
Eosinophils Relative: 1 %
HEMATOCRIT: 42 % (ref 36.0–46.0)
HEMOGLOBIN: 13.6 g/dL (ref 12.0–15.0)
Lymphocytes Relative: 18 %
Lymphs Abs: 1.6 10*3/uL (ref 0.7–4.0)
MCH: 29 pg (ref 26.0–34.0)
MCHC: 32.4 g/dL (ref 30.0–36.0)
MCV: 89.6 fL (ref 78.0–100.0)
MONOS PCT: 5 %
Monocytes Absolute: 0.4 10*3/uL (ref 0.1–1.0)
NEUTROS ABS: 6.6 10*3/uL (ref 1.7–7.7)
NEUTROS PCT: 76 %
Platelets: 293 10*3/uL (ref 150–400)
RBC: 4.69 MIL/uL (ref 3.87–5.11)
RDW: 13.4 % (ref 11.5–15.5)
WBC: 8.7 10*3/uL (ref 4.0–10.5)

## 2015-04-12 LAB — I-STAT CHEM 8, ED
BUN: 8 mg/dL (ref 6–20)
CREATININE: 0.8 mg/dL (ref 0.44–1.00)
Calcium, Ion: 1.17 mmol/L (ref 1.12–1.23)
Chloride: 101 mmol/L (ref 101–111)
Glucose, Bld: 82 mg/dL (ref 65–99)
HEMATOCRIT: 45 % (ref 36.0–46.0)
HEMOGLOBIN: 15.3 g/dL — AB (ref 12.0–15.0)
POTASSIUM: 4.1 mmol/L (ref 3.5–5.1)
SODIUM: 143 mmol/L (ref 135–145)
TCO2: 29 mmol/L (ref 0–100)

## 2015-04-12 LAB — PROTIME-INR
INR: 0.99 (ref 0.00–1.49)
Prothrombin Time: 13.3 seconds (ref 11.6–15.2)

## 2015-04-12 LAB — SAMPLE TO BLOOD BANK

## 2015-04-12 LAB — CDS SEROLOGY

## 2015-04-12 LAB — ETHANOL: Alcohol, Ethyl (B): <5 mg/dL (ref <5)

## 2015-04-12 MED ORDER — IOHEXOL 300 MG/ML  SOLN
100.0000 mL | Freq: Once | INTRAMUSCULAR | Status: AC | PRN
  Administered 2015-04-12: 100 mL via INTRAVENOUS

## 2015-04-12 MED ORDER — CYCLOBENZAPRINE HCL 10 MG PO TABS: 10.0000 mg | ORAL_TABLET | Freq: Two times a day (BID) | ORAL | Status: DC | PRN

## 2015-04-12 MED ORDER — MORPHINE SULFATE (PF) 4 MG/ML IV SOLN
4.0000 mg | Freq: Once | INTRAVENOUS | Status: AC
  Administered 2015-04-12: 4 mg via INTRAVENOUS
  Filled 2015-04-12: qty 1

## 2015-04-12 MED ORDER — DIAZEPAM 5 MG PO TABS
5.0000 mg | ORAL_TABLET | Freq: Once | ORAL | Status: AC
  Administered 2015-04-12: 5 mg via ORAL
  Filled 2015-04-12: qty 1

## 2015-04-12 MED ORDER — ONDANSETRON HCL 4 MG/2ML IJ SOLN
4.0000 mg | Freq: Once | INTRAMUSCULAR | Status: AC
  Administered 2015-04-12: 4 mg via INTRAVENOUS
  Filled 2015-04-12: qty 2

## 2015-04-12 MED ORDER — GADOBENATE DIMEGLUMINE 529 MG/ML IV SOLN
15.0000 mL | Freq: Once | INTRAVENOUS | Status: AC | PRN
  Administered 2015-04-12: 15 mL via INTRAVENOUS

## 2015-04-12 MED ORDER — ONDANSETRON 4 MG PO TBDP
4.0000 mg | ORAL_TABLET | Freq: Three times a day (TID) | ORAL | Status: DC | PRN
Start: 2015-04-12 — End: 2016-01-19

## 2015-04-12 MED ORDER — CYCLOBENZAPRINE HCL 10 MG PO TABS
5.0000 mg | ORAL_TABLET | Freq: Once | ORAL | Status: AC
  Administered 2015-04-12: 5 mg via ORAL
  Filled 2015-04-12: qty 1

## 2015-04-12 MED ORDER — SODIUM CHLORIDE 0.9 % IV BOLUS (SEPSIS)
500.0000 mL | Freq: Once | INTRAVENOUS | Status: AC
  Administered 2015-04-12: 500 mL via INTRAVENOUS

## 2015-04-12 NOTE — ED Notes (Signed)
Patient transported to MRI 

## 2015-04-12 NOTE — ED Notes (Signed)
Family at beside. Family given emotional support.  Transported to CT

## 2015-04-12 NOTE — ED Notes (Signed)
RN offered pt a wheelchair. Pt refused wheelchair. Pt asks RN NOT to follow her out. RN insisted for pt safety. Pt ambulatory to door.

## 2015-04-12 NOTE — Progress Notes (Signed)
DR. Dalene SeltzerSchlossman do not wait on labs or istat scan patient, she is upgrading to level 2 Trauma.
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
## 2015-04-12 NOTE — ED Notes (Signed)
Dr Susie CassetteSchlossm called level 2 charge nurse and secretary notified.

## 2015-04-12 NOTE — Discharge Instructions (Signed)
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
## 2015-04-12 NOTE — ED Notes (Signed)
Patient remains in MRI 

## 2015-04-12 NOTE — ED Notes (Signed)
MD at bedside. 

## 2015-08-23 ENCOUNTER — Other Ambulatory Visit (HOSPITAL_COMMUNITY): Payer: Self-pay | Admitting: Family Medicine

## 2015-08-24 ENCOUNTER — Other Ambulatory Visit (HOSPITAL_COMMUNITY): Payer: Self-pay | Admitting: Family Medicine

## 2015-08-24 DIAGNOSIS — Z8249 Family history of ischemic heart disease and other diseases of the circulatory system: Secondary | ICD-10-CM

## 2015-08-27 ENCOUNTER — Ambulatory Visit (HOSPITAL_COMMUNITY)
Admission: RE | Admit: 2015-08-27 | Discharge: 2015-08-27 | Disposition: A | Discharge status: Home / Self Care | Payer: Medicare Other | Source: Ambulatory Visit | Attending: Family Medicine | Admitting: Family Medicine

## 2015-08-27 ENCOUNTER — Other Ambulatory Visit (HOSPITAL_COMMUNITY): Payer: Self-pay | Admitting: Family Medicine

## 2015-08-27 DIAGNOSIS — R059 Cough, unspecified: Secondary | ICD-10-CM

## 2015-08-27 DIAGNOSIS — Z8489 Family history of other specified conditions: Secondary | ICD-10-CM | POA: Diagnosis present

## 2015-08-27 DIAGNOSIS — R05 Cough: Secondary | ICD-10-CM | POA: Diagnosis not present

## 2015-08-27 DIAGNOSIS — Z8249 Family history of ischemic heart disease and other diseases of the circulatory system: Secondary | ICD-10-CM

## 2015-09-06 ENCOUNTER — Emergency Department (HOSPITAL_COMMUNITY)
Admission: EM | Admit: 2015-09-06 | Discharge: 2015-09-06 | Disposition: A | Discharge status: Home / Self Care | Payer: Medicare Other | Attending: Emergency Medicine | Admitting: Emergency Medicine

## 2015-09-06 ENCOUNTER — Emergency Department (HOSPITAL_COMMUNITY): Payer: Medicare Other

## 2015-09-06 ENCOUNTER — Encounter (HOSPITAL_COMMUNITY): Payer: Self-pay | Admitting: Emergency Medicine

## 2015-09-06 DIAGNOSIS — Z8673 Personal history of transient ischemic attack (TIA), and cerebral infarction without residual deficits: Secondary | ICD-10-CM | POA: Diagnosis not present

## 2015-09-06 DIAGNOSIS — Z8701 Personal history of pneumonia (recurrent): Secondary | ICD-10-CM | POA: Diagnosis not present

## 2015-09-06 DIAGNOSIS — Z880 Allergy status to penicillin: Secondary | ICD-10-CM | POA: Diagnosis not present

## 2015-09-06 DIAGNOSIS — R079 Chest pain, unspecified: Secondary | ICD-10-CM | POA: Diagnosis present

## 2015-09-06 DIAGNOSIS — F329 Major depressive disorder, single episode, unspecified: Secondary | ICD-10-CM | POA: Diagnosis not present

## 2015-09-06 DIAGNOSIS — F419 Anxiety disorder, unspecified: Secondary | ICD-10-CM | POA: Insufficient documentation

## 2015-09-06 DIAGNOSIS — I10 Essential (primary) hypertension: Secondary | ICD-10-CM | POA: Diagnosis not present

## 2015-09-06 DIAGNOSIS — Z7982 Long term (current) use of aspirin: Secondary | ICD-10-CM | POA: Insufficient documentation

## 2015-09-06 DIAGNOSIS — J45909 Unspecified asthma, uncomplicated: Secondary | ICD-10-CM | POA: Insufficient documentation

## 2015-09-06 DIAGNOSIS — R109 Unspecified abdominal pain: Secondary | ICD-10-CM | POA: Insufficient documentation

## 2015-09-06 DIAGNOSIS — G894 Chronic pain syndrome: Secondary | ICD-10-CM | POA: Diagnosis not present

## 2015-09-06 DIAGNOSIS — R0789 Other chest pain: Secondary | ICD-10-CM | POA: Diagnosis not present

## 2015-09-06 DIAGNOSIS — Z79899 Other long term (current) drug therapy: Secondary | ICD-10-CM | POA: Diagnosis not present

## 2015-09-06 DIAGNOSIS — Z86718 Personal history of other venous thrombosis and embolism: Secondary | ICD-10-CM | POA: Insufficient documentation

## 2015-09-06 LAB — CBC
HCT: 39.1 % (ref 36.0–46.0)
Hemoglobin: 12.7 g/dL (ref 12.0–15.0)
MCH: 29.2 pg (ref 26.0–34.0)
MCHC: 32.5 g/dL (ref 30.0–36.0)
MCV: 89.9 fL (ref 78.0–100.0)
PLATELETS: 402 10*3/uL — AB (ref 150–400)
RBC: 4.35 MIL/uL (ref 3.87–5.11)
RDW: 13.2 % (ref 11.5–15.5)
WBC: 10.5 10*3/uL (ref 4.0–10.5)

## 2015-09-06 LAB — BASIC METABOLIC PANEL
Anion gap: 8 (ref 5–15)
BUN: 11 mg/dL (ref 6–20)
CALCIUM: 9.1 mg/dL (ref 8.9–10.3)
CO2: 27 mmol/L (ref 22–32)
CREATININE: 0.88 mg/dL (ref 0.44–1.00)
Chloride: 103 mmol/L (ref 101–111)
GFR calc non Af Amer: >60 mL/min (ref >60)
Glucose, Bld: 108 mg/dL — ABNORMAL HIGH (ref 65–99)
Potassium: 3.9 mmol/L (ref 3.5–5.1)
SODIUM: 138 mmol/L (ref 135–145)

## 2015-09-06 LAB — D-DIMER, QUANTITATIVE (NOT AT ARMC): D DIMER QUANT: 0.38 ug{FEU}/mL (ref 0.00–0.50)

## 2015-09-06 LAB — I-STAT TROPONIN, ED: TROPONIN I, POC: 0 ng/mL (ref 0.00–0.08)

## 2015-09-06 MED ORDER — MORPHINE SULFATE (PF) 4 MG/ML IV SOLN
6.0000 mg | Freq: Once | INTRAVENOUS | Status: DC
  Filled 2015-09-06: qty 2

## 2015-09-06 MED ORDER — MORPHINE SULFATE (PF) 4 MG/ML IV SOLN
6.0000 mg | Freq: Once | INTRAVENOUS | Status: AC
  Administered 2015-09-06: 6 mg via INTRAVENOUS

## 2015-09-06 MED ORDER — MORPHINE SULFATE (PF) 4 MG/ML IV SOLN: 6.0000 mg | Freq: Once | INTRAVENOUS | Status: DC

## 2015-09-06 MED ORDER — MORPHINE SULFATE (PF) 4 MG/ML IV SOLN
6.0000 mg | Freq: Once | INTRAVENOUS | Status: AC
  Administered 2015-09-06: 6 mg via INTRAVENOUS
  Filled 2015-09-06: qty 2

## 2015-09-06 NOTE — Discharge Instructions (Signed)

## 2015-09-06 NOTE — ED Notes (Signed)
Pt A&Ox4, ambulatory at d/c with steady gait, nAD

## 2015-09-06 NOTE — ED Notes (Signed)
Pt here with epigastric pain and pain under right breast x 3 weeks. Pt also reports SOB and dizziness for the same amount of time. Pt also reports HA starting today. A/o x 4, airway intact.
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
## 2016-01-18 NOTE — Patient Instructions (Signed)
[name]  01/18/2016     @PREFPERIOPPHARMACY @   Your procedure is scheduled on 01/21/2016.  Report to [facility] at 6:45 A.M.  Call this number if you have problems the morning of surgery:  [phone]   Remember:  Do not eat food or drink liquids after midnight.  Take these medicines the morning of surgery with A SIP OF WATER Albuterol inhaler and bring with you, Zyrtec, Flexeril, Prozac, Gabapentin  Synthroid, Ativan, Demerol if needed, Toprol, Prilosec, Zofran if needed, Carafate   Do not wear jewelry, make-up or nail polish.  Do not wear lotions, powders, or perfumes, or deoderant.  Do not shave 48 hours prior to surgery.  Men may shave face and neck.  Do not bring valuables to the hospital.  [facility] is not responsible for any belongings or valuables.  Contacts, dentures or bridgework may not be worn into surgery.  Leave your suitcase in the car.  After surgery it may be brought to your room.  For patients admitted to the hospital, discharge time will be determined by your treatment team.  Patients discharged the day of surgery will not be allowed to drive home.    Please read over the following fact sheets that you were given. Surgical Site Infection Prevention and Anesthesia Post-op Instructions     PATIENT INSTRUCTIONS POST-ANESTHESIA  IMMEDIATELY FOLLOWING SURGERY:  Do not drive or operate machinery for the first twenty four hours after surgery.  Do not make any important decisions for twenty four hours after surgery or while taking narcotic pain medications or sedatives.  If you develop intractable nausea and vomiting or a severe headache please notify your doctor immediately.  FOLLOW-UP:  Please make an appointment with your surgeon as instructed. You do not need to follow up with anesthesia unless specifically instructed to do so.  WOUND CARE INSTRUCTIONS (if applicable):  Keep a dry clean dressing on the anesthesia/puncture wound site if there is  drainage.  Once the wound has quit draining you may leave it open to air.  Generally you should leave the bandage intact for twenty four hours unless there is drainage.  If the epidural site drains for more than 36-48 hours please call the anesthesia department.  QUESTIONS?:  Please feel free to call your physician or the hospital operator if you have any questions, and they will be happy to assist you.      Hernia, Adult A hernia is the bulging of an organ or tissue through a weak spot in the muscles of the abdomen (abdominal wall). Hernias develop most often near the navel or groin. There are many kinds of hernias. Common kinds include:  Femoral hernia. This kind of hernia develops under the groin in the upper thigh area.  Inguinal hernia. This kind of hernia develops in the groin or scrotum.  Umbilical hernia. This kind of hernia develops near the navel.  Hiatal hernia. This kind of hernia causes part of the stomach to be pushed up into the chest.  Incisional hernia. This kind of hernia bulges through a scar from an abdominal surgery. CAUSES This condition may be caused by:  Heavy lifting.  Coughing over a long period of time.  Straining to have a bowel movement.  An incision made during an abdominal surgery.  A birth defect (congenital defect).  Excess weight or obesity.  Smoking.  Poor nutrition.  Cystic fibrosis.  Excess fluid in the abdomen.  Undescended testicles. SYMPTOMS Symptoms of a hernia include:  A lump on the abdomen. This is the first sign of a hernia. The lump may become more obvious with standing, straining, or coughing. It may get bigger over time if it is not treated or if the condition causing it is not treated.  Pain. A hernia is usually painless, but it may become painful over time if treatment is delayed. The pain is usually dull and may get worse with standing or lifting heavy objects. Sometimes a hernia gets tightly squeezed in the weak spot  (strangulated) or stuck there (incarcerated) and causes additional symptoms. These symptoms may include:  Vomiting.  Nausea.  Constipation.  Irritability. DIAGNOSIS A hernia may be diagnosed with:  A physical exam. During the exam your health care provider may ask you to cough or to make a specific movement, because a hernia is usually more visible when you move.  Imaging tests. These can include:  X-rays.  Ultrasound.  CT scan. TREATMENT A hernia that is small and painless may not need to be treated. A hernia that is large or painful may be treated with surgery. Inguinal hernias may be treated with surgery to prevent incarceration or strangulation. Strangulated hernias are always treated with surgery, because lack of blood to the trapped organ or tissue can cause it to die. Surgery to treat a hernia involves pushing the bulge back into place and repairing the weak part of the abdomen. HOME CARE INSTRUCTIONS  Avoid straining.  Do not lift anything heavier than 10 lb (4.5 kg).  Lift with your leg muscles, not your back muscles. This helps avoid strain.  When coughing, try to cough gently.  Prevent constipation. Constipation leads to straining with bowel movements, which can make a hernia worse or cause a hernia repair to break down. You can prevent constipation by:  Eating a high-fiber diet that includes plenty of fruits and vegetables.  Drinking enough fluids to keep your urine clear or pale yellow. Aim to drink 6-8 glasses of water per day.  Using a stool softener as directed by your health care provider.  Lose weight, if you are overweight.  Do not use any tobacco products, including cigarettes, chewing tobacco, or electronic cigarettes. If you need help quitting, ask your health care provider.  Keep all follow-up visits as directed by your health care provider. This is important. Your health care provider may need to monitor your condition. SEEK MEDICAL CARE  IF:  You have swelling, redness, and pain in the affected area.  Your bowel habits change. SEEK IMMEDIATE MEDICAL CARE IF:  You have a fever.  You have abdominal pain that is getting worse.  You feel nauseous or you vomit.  You cannot push the hernia back in place by gently pressing on it while you are lying down.  The hernia:  Changes in shape or size.  Is stuck outside the abdomen.  Becomes discolored.  Feels hard or tender.   This information is not intended to replace advice given to you by your health care provider. Make sure you discuss any questions you have with your health care provider.   Document Released: 04/24/2005 Document Revised: 05/15/2014 Document Reviewed: 03/04/2014 Elsevier Interactive Patient Education [employer]2016 Elsevier Inc.

## 2016-01-19 ENCOUNTER — Encounter (HOSPITAL_COMMUNITY)
Admission: RE | Admit: 2016-01-19 | Discharge: 2016-01-19 | Disposition: A | Discharge status: Home / Self Care | Payer: Medicare Other | Source: Ambulatory Visit | Attending: General Surgery | Admitting: General Surgery

## 2016-01-19 ENCOUNTER — Encounter (HOSPITAL_COMMUNITY): Payer: Self-pay

## 2016-01-19 DIAGNOSIS — R1905 Periumbilic swelling, mass or lump: Secondary | ICD-10-CM | POA: Diagnosis present

## 2016-01-19 DIAGNOSIS — F329 Major depressive disorder, single episode, unspecified: Secondary | ICD-10-CM | POA: Diagnosis not present

## 2016-01-19 DIAGNOSIS — K219 Gastro-esophageal reflux disease without esophagitis: Secondary | ICD-10-CM | POA: Diagnosis not present

## 2016-01-19 DIAGNOSIS — J45909 Unspecified asthma, uncomplicated: Secondary | ICD-10-CM | POA: Diagnosis not present

## 2016-01-19 DIAGNOSIS — Z79899 Other long term (current) drug therapy: Secondary | ICD-10-CM | POA: Diagnosis not present

## 2016-01-19 DIAGNOSIS — K432 Incisional hernia without obstruction or gangrene: Secondary | ICD-10-CM | POA: Diagnosis not present

## 2016-01-19 DIAGNOSIS — Z79891 Long term (current) use of opiate analgesic: Secondary | ICD-10-CM | POA: Diagnosis not present

## 2016-01-19 DIAGNOSIS — M1990 Unspecified osteoarthritis, unspecified site: Secondary | ICD-10-CM | POA: Diagnosis not present

## 2016-01-19 DIAGNOSIS — E039 Hypothyroidism, unspecified: Secondary | ICD-10-CM | POA: Diagnosis not present

## 2016-01-19 DIAGNOSIS — Z791 Long term (current) use of non-steroidal anti-inflammatories (NSAID): Secondary | ICD-10-CM | POA: Diagnosis not present

## 2016-01-19 DIAGNOSIS — F419 Anxiety disorder, unspecified: Secondary | ICD-10-CM | POA: Diagnosis not present

## 2016-01-19 HISTORY — DX: Hypothyroidism, unspecified: E03.9

## 2016-01-19 HISTORY — DX: Unspecified osteoarthritis, unspecified site: M19.90

## 2016-01-19 HISTORY — DX: Transient cerebral ischemic attack, unspecified: G45.9

## 2016-01-19 LAB — BASIC METABOLIC PANEL
Anion gap: 10 (ref 5–15)
BUN: 15 mg/dL (ref 6–20)
CALCIUM: 9.2 mg/dL (ref 8.9–10.3)
CO2: 29 mmol/L (ref 22–32)
CREATININE: 1.05 mg/dL — AB (ref 0.44–1.00)
Chloride: 99 mmol/L — ABNORMAL LOW (ref 101–111)
GFR calc Af Amer: >60 mL/min (ref >60)
GFR, EST NON AFRICAN AMERICAN: 57 mL/min — AB (ref >60)
GLUCOSE: 101 mg/dL — AB (ref 65–99)
POTASSIUM: 4.3 mmol/L (ref 3.5–5.1)
SODIUM: 138 mmol/L (ref 135–145)

## 2016-01-19 LAB — CBC WITH DIFFERENTIAL/PLATELET
BASOS ABS: 0 10*3/uL (ref 0.0–0.1)
Basophils Relative: 0 %
EOS ABS: 0.4 10*3/uL (ref 0.0–0.7)
EOS PCT: 6 %
HCT: 40.4 % (ref 36.0–46.0)
Hemoglobin: 13.2 g/dL (ref 12.0–15.0)
LYMPHS PCT: 38 %
Lymphs Abs: 2.8 10*3/uL (ref 0.7–4.0)
MCH: 29.2 pg (ref 26.0–34.0)
MCHC: 32.7 g/dL (ref 30.0–36.0)
MCV: 89.4 fL (ref 78.0–100.0)
MONO ABS: 0.6 10*3/uL (ref 0.1–1.0)
Monocytes Relative: 9 %
Neutro Abs: 3.4 10*3/uL (ref 1.7–7.7)
Neutrophils Relative %: 47 %
PLATELETS: 298 10*3/uL (ref 150–400)
RBC: 4.52 MIL/uL (ref 3.87–5.11)
RDW: 12.6 % (ref 11.5–15.5)
WBC: 7.2 10*3/uL (ref 4.0–10.5)

## 2016-01-19 NOTE — Pre-Procedure Instructions (Signed)
Patient given information  To sign up for my chart at home.

## 2016-01-21 ENCOUNTER — Encounter (HOSPITAL_COMMUNITY): Payer: Self-pay | Admitting: *Deleted

## 2016-01-21 ENCOUNTER — Encounter (HOSPITAL_COMMUNITY)
Admission: RE | Disposition: A | Discharge status: Home / Self Care | Payer: Self-pay | Source: Ambulatory Visit | Attending: General Surgery

## 2016-01-21 ENCOUNTER — Ambulatory Visit (HOSPITAL_COMMUNITY)
Admission: RE | Admit: 2016-01-21 | Discharge: 2016-01-21 | Disposition: A | Discharge status: Home / Self Care | Payer: Medicare Other | Source: Ambulatory Visit | Attending: General Surgery | Admitting: General Surgery

## 2016-01-21 ENCOUNTER — Ambulatory Visit (HOSPITAL_COMMUNITY): Payer: Medicare Other | Admitting: Anesthesiology

## 2016-01-21 DIAGNOSIS — K432 Incisional hernia without obstruction or gangrene: Secondary | ICD-10-CM | POA: Insufficient documentation

## 2016-01-21 DIAGNOSIS — Z79899 Other long term (current) drug therapy: Secondary | ICD-10-CM | POA: Insufficient documentation

## 2016-01-21 DIAGNOSIS — F419 Anxiety disorder, unspecified: Secondary | ICD-10-CM | POA: Insufficient documentation

## 2016-01-21 DIAGNOSIS — Z791 Long term (current) use of non-steroidal anti-inflammatories (NSAID): Secondary | ICD-10-CM | POA: Insufficient documentation

## 2016-01-21 DIAGNOSIS — M1990 Unspecified osteoarthritis, unspecified site: Secondary | ICD-10-CM | POA: Insufficient documentation

## 2016-01-21 DIAGNOSIS — Z79891 Long term (current) use of opiate analgesic: Secondary | ICD-10-CM | POA: Insufficient documentation

## 2016-01-21 DIAGNOSIS — K219 Gastro-esophageal reflux disease without esophagitis: Secondary | ICD-10-CM | POA: Insufficient documentation

## 2016-01-21 DIAGNOSIS — E039 Hypothyroidism, unspecified: Secondary | ICD-10-CM | POA: Insufficient documentation

## 2016-01-21 DIAGNOSIS — J45909 Unspecified asthma, uncomplicated: Secondary | ICD-10-CM | POA: Insufficient documentation

## 2016-01-21 DIAGNOSIS — F329 Major depressive disorder, single episode, unspecified: Secondary | ICD-10-CM | POA: Insufficient documentation

## 2016-01-21 HISTORY — PX: INCISIONAL HERNIA REPAIR: SHX193

## 2016-01-21 SURGERY — REPAIR, HERNIA, INCISIONAL
Anesthesia: General | Site: Abdomen

## 2016-01-21 MED ORDER — MIDAZOLAM HCL 2 MG/2ML IJ SOLN
1.0000 mg | INTRAMUSCULAR | Status: DC | PRN
  Administered 2016-01-21: 2 mg via INTRAVENOUS

## 2016-01-21 MED ORDER — FENTANYL CITRATE (PF) 250 MCG/5ML IJ SOLN
INTRAMUSCULAR | Status: AC
  Filled 2016-01-21: qty 5

## 2016-01-21 MED ORDER — ONDANSETRON HCL 4 MG/2ML IJ SOLN
4.0000 mg | Freq: Once | INTRAMUSCULAR | Status: AC
  Administered 2016-01-21: 4 mg via INTRAVENOUS

## 2016-01-21 MED ORDER — NEOSTIGMINE METHYLSULFATE 10 MG/10ML IV SOLN
INTRAVENOUS | Status: AC
  Filled 2016-01-21: qty 1

## 2016-01-21 MED ORDER — ROCURONIUM BROMIDE 50 MG/5ML IV SOLN
INTRAVENOUS | Status: AC
  Filled 2016-01-21: qty 1

## 2016-01-21 MED ORDER — GLYCOPYRROLATE 0.2 MG/ML IJ SOLN
INTRAMUSCULAR | Status: AC
  Filled 2016-01-21: qty 3

## 2016-01-21 MED ORDER — CHLORHEXIDINE GLUCONATE CLOTH 2 % EX PADS: 6.0000 | MEDICATED_PAD | Freq: Once | CUTANEOUS | Status: DC

## 2016-01-21 MED ORDER — MORPHINE SULFATE (PF) 4 MG/ML IV SOLN
INTRAVENOUS | Status: AC
  Filled 2016-01-21: qty 1

## 2016-01-21 MED ORDER — LACTATED RINGERS IV SOLN
INTRAVENOUS | Status: DC
  Administered 2016-01-21: 75 mL/h via INTRAVENOUS
  Administered 2016-01-21: 08:00:00 via INTRAVENOUS

## 2016-01-21 MED ORDER — MEPERIDINE HCL 50 MG PO TABS: 50.0000 mg | ORAL_TABLET | Freq: Four times a day (QID) | ORAL | 0 refills | Status: DC | PRN

## 2016-01-21 MED ORDER — PROPOFOL 10 MG/ML IV BOLUS
INTRAVENOUS | Status: DC | PRN
  Administered 2016-01-21: 150 mg via INTRAVENOUS

## 2016-01-21 MED ORDER — PROPOFOL 10 MG/ML IV BOLUS
INTRAVENOUS | Status: AC
  Filled 2016-01-21: qty 20

## 2016-01-21 MED ORDER — VANCOMYCIN HCL IN DEXTROSE 1-5 GM/200ML-% IV SOLN
1000.0000 mg | INTRAVENOUS | Status: AC
  Administered 2016-01-21: 1000 mg via INTRAVENOUS

## 2016-01-21 MED ORDER — MEPERIDINE HCL 50 MG/ML IJ SOLN
25.0000 mg | Freq: Once | INTRAMUSCULAR | Status: AC
  Administered 2016-01-21: 25 mg via INTRAVENOUS

## 2016-01-21 MED ORDER — SUFENTANIL CITRATE 50 MCG/ML IV SOLN
INTRAVENOUS | Status: DC | PRN
  Administered 2016-01-21 (×3): 2.5 ug via INTRAVENOUS

## 2016-01-21 MED ORDER — LIDOCAINE HCL (CARDIAC) 10 MG/ML IV SOLN
INTRAVENOUS | Status: DC | PRN
  Administered 2016-01-21: 50 mg via INTRAVENOUS

## 2016-01-21 MED ORDER — ONDANSETRON HCL 4 MG/2ML IJ SOLN
INTRAMUSCULAR | Status: AC
  Filled 2016-01-21: qty 2

## 2016-01-21 MED ORDER — SODIUM CHLORIDE 0.9 % IJ SOLN
INTRAMUSCULAR | Status: AC
  Filled 2016-01-21: qty 10

## 2016-01-21 MED ORDER — POVIDONE-IODINE 10 % EX OINT
TOPICAL_OINTMENT | CUTANEOUS | Status: AC
  Filled 2016-01-21: qty 1

## 2016-01-21 MED ORDER — MIDAZOLAM HCL 2 MG/2ML IJ SOLN
INTRAMUSCULAR | Status: AC
  Filled 2016-01-21: qty 2

## 2016-01-21 MED ORDER — MORPHINE SULFATE (PF) 2 MG/ML IV SOLN: 4.0000 mg | INTRAVENOUS | Status: DC | PRN

## 2016-01-21 MED ORDER — MEPERIDINE HCL 50 MG/ML IJ SOLN
INTRAMUSCULAR | Status: AC
  Filled 2016-01-21: qty 1

## 2016-01-21 MED ORDER — GLYCOPYRROLATE 0.2 MG/ML IJ SOLN
INTRAMUSCULAR | Status: DC | PRN
  Administered 2016-01-21: 0.6 mg via INTRAVENOUS

## 2016-01-21 MED ORDER — NEOSTIGMINE METHYLSULFATE 10 MG/10ML IV SOLN
INTRAVENOUS | Status: DC | PRN
  Administered 2016-01-21: 4 mg via INTRAVENOUS

## 2016-01-21 MED ORDER — SUFENTANIL CITRATE 50 MCG/ML IV SOLN
INTRAVENOUS | Status: AC
  Filled 2016-01-21: qty 1

## 2016-01-21 MED ORDER — ROCURONIUM BROMIDE 100 MG/10ML IV SOLN
INTRAVENOUS | Status: DC | PRN
  Administered 2016-01-21: 30 mg via INTRAVENOUS

## 2016-01-21 MED ORDER — 0.9 % SODIUM CHLORIDE (POUR BTL) OPTIME
TOPICAL | Status: DC | PRN
  Administered 2016-01-21: 500 mL

## 2016-01-21 MED ORDER — POVIDONE-IODINE 10 % OINT PACKET
TOPICAL_OINTMENT | CUTANEOUS | Status: DC | PRN
  Administered 2016-01-21: 1 via TOPICAL

## 2016-01-21 MED ORDER — BUPIVACAINE HCL (PF) 0.5 % IJ SOLN
INTRAMUSCULAR | Status: AC
  Filled 2016-01-21: qty 30

## 2016-01-21 MED ORDER — MORPHINE SULFATE (PF) 2 MG/ML IV SOLN
2.0000 mg | INTRAVENOUS | Status: AC
  Administered 2016-01-21 (×2): 2 mg via INTRAVENOUS

## 2016-01-21 MED ORDER — VANCOMYCIN HCL IN DEXTROSE 1-5 GM/200ML-% IV SOLN
INTRAVENOUS | Status: AC
  Filled 2016-01-21: qty 200

## 2016-01-21 MED ORDER — MORPHINE SULFATE (PF) 2 MG/ML IV SOLN
1.0000 mg | INTRAVENOUS | Status: DC | PRN
  Administered 2016-01-21 (×3): 2 mg via INTRAVENOUS
  Filled 2016-01-21 (×3): qty 1

## 2016-01-21 MED ORDER — BUPIVACAINE HCL (PF) 0.5 % IJ SOLN
INTRAMUSCULAR | Status: DC | PRN
  Administered 2016-01-21: 20 mL

## 2016-01-21 MED ORDER — BUPIVACAINE LIPOSOME 1.3 % IJ SUSP
INTRAMUSCULAR | Status: AC
  Filled 2016-01-21: qty 20

## 2016-01-21 MED ORDER — LIDOCAINE HCL (PF) 1 % IJ SOLN
INTRAMUSCULAR | Status: AC
  Filled 2016-01-21: qty 5

## 2016-01-21 SURGICAL SUPPLY — 42 items
BAG HAMPER (MISCELLANEOUS) ×2 IMPLANT
BLADE SURG SZ11 CARB STEEL (BLADE) ×2 IMPLANT
CHLORAPREP W/TINT 26ML (MISCELLANEOUS) ×2 IMPLANT
CLOTH BEACON ORANGE TIMEOUT ST (SAFETY) ×2 IMPLANT
COVER LIGHT HANDLE STERIS (MISCELLANEOUS) ×4 IMPLANT
ELECT REM PT RETURN 9FT ADLT (ELECTROSURGICAL) ×2
ELECTRODE REM PT RTRN 9FT ADLT (ELECTROSURGICAL) ×1 IMPLANT
GAUZE SPONGE 4X4 12PLY STRL (GAUZE/BANDAGES/DRESSINGS) ×1 IMPLANT
GLOVE BIOGEL PI IND STRL 7.0 (GLOVE) ×1 IMPLANT
GLOVE BIOGEL PI INDICATOR 7.0 (GLOVE) ×1
GLOVE SURG SS PI 7.5 STRL IVOR (GLOVE) ×2 IMPLANT
GOWN STRL REUS W/ TWL LRG LVL3 (GOWN DISPOSABLE) ×1 IMPLANT
GOWN STRL REUS W/ TWL XL LVL3 (GOWN DISPOSABLE) ×1 IMPLANT
GOWN STRL REUS W/TWL LRG LVL3 (GOWN DISPOSABLE) ×2
GOWN STRL REUS W/TWL XL LVL3 (GOWN DISPOSABLE) ×2
INST SET MINOR GENERAL (KITS) ×1 IMPLANT
KIT ROOM TURNOVER APOR (KITS) ×2 IMPLANT
MANIFOLD NEPTUNE II (INSTRUMENTS) ×2 IMPLANT
MESH VENTRALEX ST 2.5 CRC MED (Mesh General) ×1 IMPLANT
NDL HYPO 21X1.5 SAFETY (NEEDLE) ×1 IMPLANT
NEEDLE HYPO 21X1.5 SAFETY (NEEDLE) ×2 IMPLANT
NS IRRIG 1000ML POUR BTL (IV SOLUTION) ×2 IMPLANT
PACK MINOR (CUSTOM PROCEDURE TRAY) ×1 IMPLANT
PAD ARMBOARD 7.5X6 YLW CONV (MISCELLANEOUS) ×2 IMPLANT
SET BASIN LINEN APH (SET/KITS/TRAYS/PACK) ×2 IMPLANT
SPONGE GAUZE 2X2 8PLY STRL LF (GAUZE/BANDAGES/DRESSINGS) ×2 IMPLANT
STAPLER VISISTAT (STAPLE) ×1 IMPLANT
SUT ETHIBOND 0 MO6 C/R (SUTURE) ×1 IMPLANT
SUT NOVA NAB GS-21 1 T12 (SUTURE) IMPLANT
SUT NOVA NAB GS-22 2 2-0 T-19 (SUTURE) IMPLANT
SUT NOVA NAB GS-26 0 60 (SUTURE) IMPLANT
SUT PROLENE 0 CT 1 CR/8 (SUTURE) IMPLANT
SUT SILK 2 0 (SUTURE)
SUT SILK 2-0 18XBRD TIE 12 (SUTURE) IMPLANT
SUT VIC AB 2-0 CT1 27 (SUTURE)
SUT VIC AB 2-0 CT1 TAPERPNT 27 (SUTURE) ×1 IMPLANT
SUT VIC AB 3-0 SH 27 (SUTURE) ×2
SUT VIC AB 3-0 SH 27X BRD (SUTURE) ×1 IMPLANT
SUT VIC AB 4-0 PS2 27 (SUTURE) IMPLANT
SUT VICRYL AB 2 0 TIES (SUTURE) IMPLANT
SYR 20CC LL (SYRINGE) ×2 IMPLANT
TAPE CLOTH SURG 4X10 WHT LF (GAUZE/BANDAGES/DRESSINGS) ×1 IMPLANT

## 2016-01-21 NOTE — Discharge Instructions (Signed)
Open Hernia Repair, Care After °Refer to this sheet in the next few weeks. These instructions provide you with information on caring for yourself after your procedure. Your health care provider may also give you more specific instructions. Your treatment has been planned according to current medical practices, but problems sometimes occur. Call your health care provider if you have any problems or questions after your procedure. °WHAT TO EXPECT AFTER THE PROCEDURE °After your procedure, it is typical to have the following: °· Pain in your abdomen, especially along your incision. You will be given pain medicines to control the pain. °· Constipation. You may be given a stool softener to help prevent this. °HOME CARE INSTRUCTIONS °· Only take over-the-counter or prescription medicines as directed by your health care provider. °· Keep the incision area dry and clean. You may wash the incision area gently with soap and water 48 hours after surgery. Gently blot or dab the incision area dry. Do not take baths, use swimming pools, or use hot tubs for 10 days or until your health care provider approves. °· Change bandages (dressings) as directed by your health care provider. °· Continue your normal diet as directed by your health care provider. Eat plenty of fruits and vegetables to help prevent constipation. °· Drink enough fluids to keep your urine clear or pale yellow. This also helps prevent constipation. °· Do not drive until your health care provider says it is okay. °· Do not lift anything heavier than 10 lb (4.5 kg) or play contact sports for 4 weeks or until your health care provider approves. °· Follow up with your health care provider as directed. Ask your health care provider when to make an appointment to have your stitches (sutures) or staples removed. °SEEK MEDICAL CARE IF: °· You have increased bleeding coming from the incision site. °· You have blood in your stool. °· You have increasing pain in the incision  area. °· You see redness or swelling in the incision area. °· You have fluid (pus) coming from the incision. °· You have a fever. °· You notice a bad smell coming from the incision area or dressing. °SEEK IMMEDIATE MEDICAL CARE IF: °· You develop a rash. °· You have chest pain or shortness of breath. °· You feel lightheaded or feel faint. °  °This information is not intended to replace advice given to you by your health care provider. Make sure you discuss any questions you have with your health care provider. °  °Document Released: 11/11/2004 Document Revised: 05/15/2014 Document Reviewed: 12/04/2012 °Elsevier Interactive Patient Education ©2016 Elsevier Inc. ° ° ° °PATIENT INSTRUCTIONS °POST-ANESTHESIA ° °IMMEDIATELY FOLLOWING SURGERY:  Do not drive or operate machinery for the first twenty four hours after surgery.  Do not make any important decisions for twenty four hours after surgery or while taking narcotic pain medications or sedatives.  If you develop intractable nausea and vomiting or a severe headache please notify your doctor immediately. ° °FOLLOW-UP:  Please make an appointment with your surgeon as instructed. You do not need to follow up with anesthesia unless specifically instructed to do so. ° °WOUND CARE INSTRUCTIONS (if applicable):  Keep a dry clean dressing on the anesthesia/puncture wound site if there is drainage.  Once the wound has quit draining you may leave it open to air.  Generally you should leave the bandage intact for twenty four hours unless there is drainage.  If the epidural site drains for more than 36-48 hours please call the anesthesia department. ° °  QUESTIONS?:  Please feel free to call your physician or the hospital operator if you have any questions, and they will be happy to assist you.    ° ° ° °

## 2016-01-21 NOTE — Anesthesia Procedure Notes (Signed)
Procedure Name: Intubation Date/Time: 01/21/2016 8:26 AM Performed by: Patrcia DollyMOSES, Jamell Opfer Pre-anesthesia Checklist: Patient identified, Patient being monitored, Timeout performed, Emergency Drugs available and Suction available Patient Re-evaluated:Patient Re-evaluated prior to inductionOxygen Delivery Method: Circle System Utilized Preoxygenation: Pre-oxygenation with 100% oxygen Intubation Type: IV induction Ventilation: Mask ventilation without difficulty Laryngoscope Size: Miller and 2 Grade View: Grade I Tube type: Oral Tube size: 7.0 mm Number of attempts: 1 Airway Equipment and Method: Stylet Placement Confirmation: ETT inserted through vocal cords under direct vision,  positive ETCO2 and breath sounds checked- equal and bilateral Secured at: 21 cm Tube secured with: Tape Dental Injury: Teeth and Oropharynx as per pre-operative assessment

## 2016-01-21 NOTE — Anesthesia Postprocedure Evaluation (Signed)
Anesthesia Post Note  Patient: Wilhelmenia BlaseCheryl C Mannes  Procedure(s) Performed: Procedure(s) (LRB): HERNIA REPAIR INCISIONAL WITH MESH (N/A)  Patient location during evaluation: PACU Anesthesia Type: General Level of consciousness: sedated, patient cooperative and responds to stimulation Pain management: pain level controlled Vital Signs Assessment: post-procedure vital signs reviewed and stable Respiratory status: spontaneous breathing, non-rebreather facemask and nonlabored ventilation Cardiovascular status: stable Anesthetic complications: no    Last Vitals:  Vitals:   01/21/16 0801 01/21/16 0810  Pulse: 82 83  Resp: 18 20  Temp:      Last Pain:  Vitals:   01/21/16 0727  TempSrc: Oral  PainSc: 5                  Kobie Matkins

## 2016-01-21 NOTE — Anesthesia Preprocedure Evaluation (Signed)
Anesthesia Evaluation  Patient identified by MRN, date of birth, ID band Patient awake    Reviewed: Allergy & Precautions, H&P , NPO status , Patient's Chart, lab work & pertinent test results, reviewed documented beta blocker date and time   Airway Mallampati: I  TM Distance: >3 FB Neck ROM: Full    Dental  (+) Teeth Intact, Dental Advisory Given   Pulmonary shortness of breath, asthma ,    breath sounds clear to auscultation       Cardiovascular + dysrhythmias  Rhythm:Regular Rate:Normal     Neuro/Psych  Headaches, Seizures -,  PSYCHIATRIC DISORDERS (suicide attempt) Anxiety Depression TIA   GI/Hepatic PUD, GERD  ,  Endo/Other  Hypothyroidism   Renal/GU      Musculoskeletal  (+) Arthritis , Fibromyalgia -  Abdominal   Peds  Hematology   Anesthesia Other Findings   Reproductive/Obstetrics                             Anesthesia Physical Anesthesia Plan  ASA: III  Anesthesia Plan: General   Post-op Pain Management:    Induction: Intravenous, Rapid sequence and Cricoid pressure planned  Airway Management Planned: Oral ETT  Additional Equipment:   Intra-op Plan:   Post-operative Plan: Extubation in OR  Informed Consent: I have reviewed the patients History and Physical, chart, labs and discussed the procedure including the risks, benefits and alternatives for the proposed anesthesia with the patient or authorized representative who has indicated his/her understanding and acceptance.     Plan Discussed with:   Anesthesia Plan Comments:         Anesthesia Quick Evaluation
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
## 2016-01-21 NOTE — Transfer of Care (Signed)
Immediate Anesthesia Transfer of Care Note  Patient: [name]  Procedure(s) Performed: Procedure(s): HERNIA REPAIR INCISIONAL WITH MESH (N/A)  Patient Location: PACU  Anesthesia Type:General  Level of Consciousness: sedated, patient cooperative and responds to stimulation  Airway & Oxygen Therapy: Patient Spontanous Breathing and Patient connected to face mask oxygen  Post-op Assessment: Report given to RN, Post -op Vital signs reviewed and stable and Patient moving all extremities X 4  Post vital signs: Reviewed and stable  Last Vitals:  Vitals:   01/21/16 0801 01/21/16 0810  Pulse: 82 83  Resp: 18 20  Temp:      Last Pain:  Vitals:   01/21/16 0727  TempSrc: Oral  PainSc: 5       Patients Stated Pain Goal: 9 (01/21/16 0727)  Complications: No apparent anesthesia complications

## 2016-01-21 NOTE — Interval H&P Note (Signed)
History and Physical Interval Note:  01/21/2016 7:12 AM  [name]  has presented today for surgery, with the diagnosis of incisional hernia  The various methods of treatment have been discussed with the patient and family. After consideration of risks, benefits and other options for treatment, the patient has consented to  Procedure(s): HERNIA REPAIR INCISIONAL WITH MESH (N/A) as a surgical intervention .  The patient's history has been reviewed, patient examined, no change in status, stable for surgery.  I have reviewed the patient's chart and labs.  Questions were answered to the patient's satisfaction.     Franky MachoJENKINS,[employer]

## 2016-02-01 ENCOUNTER — Encounter (HOSPITAL_COMMUNITY): Payer: Self-pay | Admitting: General Surgery

## 2016-02-05 ENCOUNTER — Other Ambulatory Visit: Payer: Self-pay | Admitting: Neurological Surgery

## 2016-02-05 DIAGNOSIS — M4316 Spondylolisthesis, lumbar region: Secondary | ICD-10-CM

## 2016-02-16 ENCOUNTER — Ambulatory Visit
Admission: RE | Admit: 2016-02-16 | Discharge: 2016-02-16 | Disposition: A | Discharge status: Home / Self Care | Payer: Medicare Other | Source: Ambulatory Visit | Attending: Neurological Surgery | Admitting: Neurological Surgery

## 2016-02-16 DIAGNOSIS — M4316 Spondylolisthesis, lumbar region: Secondary | ICD-10-CM

## 2016-02-16 MED ORDER — GADOBENATE DIMEGLUMINE 529 MG/ML IV SOLN
18.0000 mL | Freq: Once | INTRAVENOUS | Status: AC | PRN
  Administered 2016-02-16: 18 mL via INTRAVENOUS

## 2016-02-22 ENCOUNTER — Other Ambulatory Visit: Payer: Self-pay | Admitting: Neurological Surgery

## 2016-05-08 HISTORY — PX: BACK SURGERY: SHX140

## 2016-05-29 NOTE — Pre-Procedure Instructions (Signed)
Tracy BlaseCheryl C Savage  05/29/2016      [facility] - [place], [place] - [address]  [place] [id number] Phone: [phone] Fax: [phone]    Your procedure is scheduled on Mon, Jan 29 @ 7:30 AM  [facility] Admitting at 5:30 AM  Call this number if you have problems the morning of surgery:  [phone]   Remember:  Do not eat food or drink liquids after midnight.  Take these medicines the morning of surgery with A SIP OF WATER Albuterol,<Bring Your Inhaler With You>,Zyrtec(Cetirizine),Prozac(Fluoxetine),Gabapentin(Neurontin),Synthroid(Levothyroxine),Ativan(Lorazepam),Metoprolol(Toprol), and Omeprazole(Prilosec)   Do not wear jewelry, make-up or nail polish.  Do not wear lotions, powders,perfumes, or deoderant.  Do not shave 48 hours prior to surgery.   Do not bring valuables to the hospital.  [facility] is not responsible for any belongings or valuables.  Contacts, dentures or bridgework may not be worn into surgery.  Leave your suitcase in the car.  After surgery it may be brought to your room.  For patients admitted to the hospital, discharge time will be determined by your treatment team.  Patients discharged the day of surgery will not be allowed to drive home.    [facility]  Before surgery, you can play an important role.  Because skin is not sterile, your skin needs to be as free of germs as possible.  You can reduce the number of germs on you skin by washing with CHG (chlorahexidine gluconate) soap before surgery.  CHG is an antiseptic cleaner which kills germs and bonds with the skin to continue killing germs even after washing.  Please DO NOT use if you have an allergy to CHG or antibacterial soaps.  If your skin becomes reddened/irritated stop using the CHG and inform your nurse when you arrive at Short Stay.  Do not shave (including legs and underarms) for at least 48 hours  prior to the first CHG shower.  You may shave your face.  Please follow these instructions carefully:   1.  Shower with CHG Soap the night before surgery and the                                morning of Surgery.  2.  If you choose to wash your hair, wash your hair first as usual with your       normal shampoo.  3.  After you shampoo, rinse your hair and body thoroughly to remove the                      Shampoo.  4.  Use CHG as you would any other liquid soap.  You can apply chg directly       to the skin and wash gently with scrungie or a clean washcloth.  5.  Apply the CHG Soap to your body ONLY FROM THE NECK DOWN.        Do not use on open wounds or open sores.  Avoid contact with your eyes,       ears, mouth and genitals (private parts).  Wash genitals (private parts)       with your normal soap.  6.  Wash thoroughly, paying special attention to the area where your surgery        will be performed.  7.  Thoroughly rinse your body with warm water from  the neck down.  8.  DO NOT shower/wash with your normal soap after using and rinsing off       the CHG Soap.  9.  Pat yourself dry with a clean towel.            10.  Wear clean pajamas.            11.  Place clean sheets on your bed the night of your first shower and do not        sleep with pets.  Day of Surgery  Do not apply any lotions/deoderants the morning of surgery.  Please wear clean clothes to the hospital/surgery center.  Please read over the following fact sheets that you were given. Pain Booklet, Coughing and Deep Breathing, MRSA Information and Surgical Site Infection Prevention

## 2016-05-30 ENCOUNTER — Encounter (HOSPITAL_COMMUNITY)
Admission: RE | Admit: 2016-05-30 | Discharge: 2016-05-30 | Disposition: A | Discharge status: Home / Self Care | Payer: Medicare Other | Source: Ambulatory Visit | Attending: Neurological Surgery | Admitting: Neurological Surgery

## 2016-05-30 ENCOUNTER — Encounter (HOSPITAL_COMMUNITY): Payer: Self-pay

## 2016-05-30 DIAGNOSIS — Z01812 Encounter for preprocedural laboratory examination: Secondary | ICD-10-CM | POA: Insufficient documentation

## 2016-05-30 HISTORY — DX: Headache, unspecified: R51.9

## 2016-05-30 HISTORY — DX: Headache: R51

## 2016-05-30 LAB — BASIC METABOLIC PANEL
ANION GAP: 6 (ref 5–15)
BUN: 8 mg/dL (ref 6–20)
CALCIUM: 9.3 mg/dL (ref 8.9–10.3)
CO2: 30 mmol/L (ref 22–32)
CREATININE: 0.86 mg/dL (ref 0.44–1.00)
Chloride: 104 mmol/L (ref 101–111)
GFR calc Af Amer: >60 mL/min (ref >60)
GLUCOSE: 97 mg/dL (ref 65–99)
Potassium: 3.9 mmol/L (ref 3.5–5.1)
Sodium: 140 mmol/L (ref 135–145)

## 2016-05-30 LAB — CBC
HCT: 42.5 % (ref 36.0–46.0)
Hemoglobin: 13.6 g/dL (ref 12.0–15.0)
MCH: 28.6 pg (ref 26.0–34.0)
MCHC: 32 g/dL (ref 30.0–36.0)
MCV: 89.5 fL (ref 78.0–100.0)
Platelets: 325 10*3/uL (ref 150–400)
RBC: 4.75 MIL/uL (ref 3.87–5.11)
RDW: 13.4 % (ref 11.5–15.5)
WBC: 8.8 10*3/uL (ref 4.0–10.5)

## 2016-05-30 LAB — TYPE AND SCREEN
ABO/RH(D): A POS
ANTIBODY SCREEN: NEGATIVE

## 2016-05-30 LAB — SURGICAL PCR SCREEN
MRSA, PCR: NEGATIVE
STAPHYLOCOCCUS AUREUS: POSITIVE — AB

## 2016-05-30 LAB — ABO/RH: ABO/RH(D): A POS

## 2016-05-30 NOTE — Progress Notes (Signed)
PCP is Dr. Gareth MorganSteve Knowlton Cardiologist is Dr Clifton JamesMcAlhany, but hasn't seen in many years. Echo noted from 2013 Stress test from 2014 care everywhere Denies any chest pain EKG and CXR noted 09-06-15

## 2016-05-30 NOTE — Progress Notes (Signed)
Ms Summer called and informed of results from PCR screen Script [facility] Mup[facility].

## 2016-06-04 ENCOUNTER — Encounter (HOSPITAL_COMMUNITY): Payer: Self-pay | Admitting: Anesthesiology

## 2016-06-04 NOTE — Anesthesia Preprocedure Evaluation (Addendum)
Anesthesia Evaluation  Patient identified by MRN, date of birth, ID band Patient awake    Reviewed: Allergy & Precautions, H&P , NPO status , Patient's Chart, lab work & pertinent test results  Airway Mallampati: II  TM Distance: >3 FB Neck ROM: Full    Dental no notable dental hx. (+) Dental Advisory Given, Edentulous Upper, Edentulous Lower   Pulmonary shortness of breath, asthma ,    breath sounds clear to auscultation       Cardiovascular Normal cardiovascular exam+ dysrhythmias      Neuro/Psych  Headaches, Seizures -,  PSYCHIATRIC DISORDERS (suicide attempt) Anxiety Depression TIA   GI/Hepatic PUD, GERD  Medicated and Controlled,  Endo/Other  Hypothyroidism   Renal/GU   negative genitourinary   Musculoskeletal  (+) Arthritis , Fibromyalgia -  Abdominal (+) + obese,   Peds  Hematology   Anesthesia Other Findings   Reproductive/Obstetrics negative OB ROS                           Anesthesia Physical  Anesthesia Plan  ASA: II  Anesthesia Plan: General   Post-op Pain Management:    Induction: Intravenous  Airway Management Planned: Oral ETT  Additional Equipment:   Intra-op Plan:   Post-operative Plan: Extubation in OR  Informed Consent: I have reviewed the patients History and Physical, chart, labs and discussed the procedure including the risks, benefits and alternatives for the proposed anesthesia with the patient or authorized representative who has indicated his/her understanding and acceptance.     Plan Discussed with: CRNA and Surgeon  Anesthesia Plan Comments:        Anesthesia Quick Evaluation

## 2016-06-05 ENCOUNTER — Encounter (HOSPITAL_COMMUNITY)
Admission: RE | Disposition: A | Discharge status: Home Health | Payer: Self-pay | Source: Ambulatory Visit | Attending: Neurological Surgery

## 2016-06-05 ENCOUNTER — Inpatient Hospital Stay (HOSPITAL_COMMUNITY): Payer: Medicare Other

## 2016-06-05 ENCOUNTER — Inpatient Hospital Stay (HOSPITAL_COMMUNITY): Payer: Medicare Other | Admitting: Anesthesiology

## 2016-06-05 ENCOUNTER — Encounter (HOSPITAL_COMMUNITY): Payer: Self-pay | Admitting: Certified Registered Nurse Anesthetist

## 2016-06-05 ENCOUNTER — Inpatient Hospital Stay (HOSPITAL_COMMUNITY)
Admission: RE | Admit: 2016-06-05 | Discharge: 2016-06-09 | DRG: 460 | Disposition: A | Discharge status: Home Health | Payer: Medicare Other | Source: Ambulatory Visit | Attending: Neurological Surgery | Admitting: Neurological Surgery

## 2016-06-05 DIAGNOSIS — M5416 Radiculopathy, lumbar region: Secondary | ICD-10-CM | POA: Diagnosis present

## 2016-06-05 DIAGNOSIS — Z419 Encounter for procedure for purposes other than remedying health state, unspecified: Secondary | ICD-10-CM

## 2016-06-05 DIAGNOSIS — Z8673 Personal history of transient ischemic attack (TIA), and cerebral infarction without residual deficits: Secondary | ICD-10-CM | POA: Diagnosis not present

## 2016-06-05 DIAGNOSIS — K219 Gastro-esophageal reflux disease without esophagitis: Secondary | ICD-10-CM | POA: Diagnosis present

## 2016-06-05 DIAGNOSIS — E669 Obesity, unspecified: Secondary | ICD-10-CM | POA: Diagnosis present

## 2016-06-05 DIAGNOSIS — J45909 Unspecified asthma, uncomplicated: Secondary | ICD-10-CM | POA: Diagnosis present

## 2016-06-05 DIAGNOSIS — G9741 Accidental puncture or laceration of dura during a procedure: Secondary | ICD-10-CM | POA: Diagnosis not present

## 2016-06-05 DIAGNOSIS — Z79899 Other long term (current) drug therapy: Secondary | ICD-10-CM

## 2016-06-05 DIAGNOSIS — Z885 Allergy status to narcotic agent status: Secondary | ICD-10-CM

## 2016-06-05 DIAGNOSIS — E039 Hypothyroidism, unspecified: Secondary | ICD-10-CM | POA: Diagnosis present

## 2016-06-05 DIAGNOSIS — Y69 Unspecified misadventure during surgical and medical care: Secondary | ICD-10-CM | POA: Diagnosis not present

## 2016-06-05 DIAGNOSIS — Z6834 Body mass index (BMI) 34.0-34.9, adult: Secondary | ICD-10-CM

## 2016-06-05 DIAGNOSIS — Z886 Allergy status to analgesic agent status: Secondary | ICD-10-CM | POA: Diagnosis not present

## 2016-06-05 DIAGNOSIS — F329 Major depressive disorder, single episode, unspecified: Secondary | ICD-10-CM | POA: Diagnosis present

## 2016-06-05 DIAGNOSIS — Z7982 Long term (current) use of aspirin: Secondary | ICD-10-CM

## 2016-06-05 DIAGNOSIS — M4316 Spondylolisthesis, lumbar region: Secondary | ICD-10-CM | POA: Diagnosis present

## 2016-06-05 DIAGNOSIS — N390 Urinary tract infection, site not specified: Secondary | ICD-10-CM | POA: Diagnosis not present

## 2016-06-05 DIAGNOSIS — M545 Low back pain: Secondary | ICD-10-CM | POA: Diagnosis present

## 2016-06-05 DIAGNOSIS — G894 Chronic pain syndrome: Secondary | ICD-10-CM | POA: Diagnosis present

## 2016-06-05 DIAGNOSIS — F419 Anxiety disorder, unspecified: Secondary | ICD-10-CM | POA: Diagnosis present

## 2016-06-05 DIAGNOSIS — M1990 Unspecified osteoarthritis, unspecified site: Secondary | ICD-10-CM | POA: Diagnosis present

## 2016-06-05 DIAGNOSIS — Z888 Allergy status to other drugs, medicaments and biological substances status: Secondary | ICD-10-CM | POA: Diagnosis not present

## 2016-06-05 DIAGNOSIS — M797 Fibromyalgia: Secondary | ICD-10-CM | POA: Diagnosis present

## 2016-06-05 DIAGNOSIS — Z881 Allergy status to other antibiotic agents status: Secondary | ICD-10-CM

## 2016-06-05 DIAGNOSIS — D62 Acute posthemorrhagic anemia: Secondary | ICD-10-CM | POA: Diagnosis not present

## 2016-06-05 LAB — CBC
HEMATOCRIT: 29.9 % — AB (ref 36.0–46.0)
HEMOGLOBIN: 9.7 g/dL — AB (ref 12.0–15.0)
MCH: 29 pg (ref 26.0–34.0)
MCHC: 32.4 g/dL (ref 30.0–36.0)
MCV: 89.5 fL (ref 78.0–100.0)
Platelets: 241 10*3/uL (ref 150–400)
RBC: 3.34 MIL/uL — ABNORMAL LOW (ref 3.87–5.11)
RDW: 13.1 % (ref 11.5–15.5)
WBC: 17.6 10*3/uL — AB (ref 4.0–10.5)

## 2016-06-05 LAB — CREATININE, SERUM: Creatinine, Ser: 0.96 mg/dL (ref 0.44–1.00)

## 2016-06-05 SURGERY — POSTERIOR LUMBAR FUSION 2 LEVEL
Anesthesia: General | Site: Back

## 2016-06-05 MED ORDER — LEVOTHYROXINE SODIUM 25 MCG PO TABS
25.0000 ug | ORAL_TABLET | Freq: Every day | ORAL | Status: DC
  Administered 2016-06-06 – 2016-06-09 (×4): 25 ug via ORAL
  Filled 2016-06-05 (×4): qty 1

## 2016-06-05 MED ORDER — THROMBIN 5000 UNITS EX SOLR
CUTANEOUS | Status: DC | PRN
  Administered 2016-06-05 (×4): 5 mL via TOPICAL

## 2016-06-05 MED ORDER — KETOROLAC TROMETHAMINE 15 MG/ML IJ SOLN
15.0000 mg | Freq: Four times a day (QID) | INTRAMUSCULAR | Status: DC | PRN
  Administered 2016-06-05: 15 mg via INTRAVENOUS

## 2016-06-05 MED ORDER — PREDNISONE 5 MG PO TABS: 5.0000 mg | ORAL_TABLET | ORAL | Status: DC

## 2016-06-05 MED ORDER — SUGAMMADEX SODIUM 200 MG/2ML IV SOLN
INTRAVENOUS | Status: DC | PRN
  Administered 2016-06-05: 170 mg via INTRAVENOUS

## 2016-06-05 MED ORDER — ALBUTEROL SULFATE (2.5 MG/3ML) 0.083% IN NEBU: 2.5000 mg | INHALATION_SOLUTION | Freq: Four times a day (QID) | RESPIRATORY_TRACT | Status: DC | PRN

## 2016-06-05 MED ORDER — SODIUM CHLORIDE 0.9% FLUSH
3.0000 mL | Freq: Two times a day (BID) | INTRAVENOUS | Status: DC
  Administered 2016-06-06 – 2016-06-09 (×7): 3 mL via INTRAVENOUS

## 2016-06-05 MED ORDER — MIDAZOLAM HCL 2 MG/2ML IJ SOLN
INTRAMUSCULAR | Status: AC
  Filled 2016-06-05: qty 2

## 2016-06-05 MED ORDER — THROMBIN 5000 UNITS EX SOLR
CUTANEOUS | Status: AC
  Filled 2016-06-05: qty 5000

## 2016-06-05 MED ORDER — FENTANYL CITRATE (PF) 100 MCG/2ML IJ SOLN
INTRAMUSCULAR | Status: AC
  Filled 2016-06-05: qty 4

## 2016-06-05 MED ORDER — FLEET ENEMA 7-19 GM/118ML RE ENEM: 1.0000 | ENEMA | Freq: Once | RECTAL | Status: DC | PRN

## 2016-06-05 MED ORDER — MEPERIDINE HCL 50 MG PO TABS
50.0000 mg | ORAL_TABLET | ORAL | Status: DC | PRN
  Administered 2016-06-05 – 2016-06-08 (×10): 50 mg via ORAL
  Filled 2016-06-05 (×12): qty 1

## 2016-06-05 MED ORDER — DEXTROSE 5 % IV SOLN
500.0000 mg | INTRAVENOUS | Status: DC
  Filled 2016-06-05: qty 5

## 2016-06-05 MED ORDER — MIDAZOLAM HCL 5 MG/5ML IJ SOLN
INTRAMUSCULAR | Status: DC | PRN
  Administered 2016-06-05: 2 mg via INTRAVENOUS

## 2016-06-05 MED ORDER — ENOXAPARIN SODIUM 30 MG/0.3ML ~~LOC~~ SOLN: 30.0000 mg | SUBCUTANEOUS | Status: DC

## 2016-06-05 MED ORDER — ACETAMINOPHEN 325 MG PO TABS
650.0000 mg | ORAL_TABLET | ORAL | Status: DC | PRN
  Administered 2016-06-06 – 2016-06-07 (×2): 650 mg via ORAL
  Filled 2016-06-05 (×2): qty 2

## 2016-06-05 MED ORDER — GABAPENTIN 300 MG PO CAPS
900.0000 mg | ORAL_CAPSULE | Freq: Two times a day (BID) | ORAL | Status: DC
  Administered 2016-06-06 – 2016-06-09 (×8): 900 mg via ORAL
  Filled 2016-06-05 (×2): qty 9
  Filled 2016-06-05 (×8): qty 3

## 2016-06-05 MED ORDER — LIDOCAINE-EPINEPHRINE 1 %-1:100000 IJ SOLN
INTRAMUSCULAR | Status: DC | PRN
  Administered 2016-06-05: 10 mL

## 2016-06-05 MED ORDER — DOCUSATE SODIUM 100 MG PO CAPS
100.0000 mg | ORAL_CAPSULE | Freq: Two times a day (BID) | ORAL | Status: DC
  Administered 2016-06-05 – 2016-06-09 (×7): 100 mg via ORAL
  Filled 2016-06-05 (×7): qty 1

## 2016-06-05 MED ORDER — FLUOXETINE HCL 20 MG PO CAPS
60.0000 mg | ORAL_CAPSULE | Freq: Every day | ORAL | Status: DC
  Administered 2016-06-06 – 2016-06-09 (×4): 60 mg via ORAL
  Filled 2016-06-05 (×4): qty 3

## 2016-06-05 MED ORDER — ROPINIROLE HCL 1 MG PO TABS: 0.5000 mg | ORAL_TABLET | Freq: Every day | ORAL | Status: DC

## 2016-06-05 MED ORDER — MEPERIDINE HCL 50 MG PO TABS: 50.0000 mg | ORAL_TABLET | Freq: Four times a day (QID) | ORAL | Status: DC | PRN

## 2016-06-05 MED ORDER — THROMBIN 20000 UNITS EX SOLR
CUTANEOUS | Status: AC
  Filled 2016-06-05: qty 20000

## 2016-06-05 MED ORDER — MEPERIDINE HCL 25 MG/ML IJ SOLN
INTRAMUSCULAR | Status: AC
  Filled 2016-06-05: qty 1

## 2016-06-05 MED ORDER — LIDOCAINE-EPINEPHRINE (PF) 2 %-1:200000 IJ SOLN
INTRAMUSCULAR | Status: AC
  Filled 2016-06-05: qty 20

## 2016-06-05 MED ORDER — ROCURONIUM BROMIDE 10 MG/ML (PF) SYRINGE
PREFILLED_SYRINGE | INTRAVENOUS | Status: DC | PRN
  Administered 2016-06-05: 10 mg via INTRAVENOUS
  Administered 2016-06-05: 40 mg via INTRAVENOUS
  Administered 2016-06-05: 5 mg via INTRAVENOUS
  Administered 2016-06-05: 20 mg via INTRAVENOUS
  Administered 2016-06-05: 10 mg via INTRAVENOUS

## 2016-06-05 MED ORDER — GABAPENTIN 600 MG PO TABS
600.0000 mg | ORAL_TABLET | Freq: Once | ORAL | Status: DC
  Filled 2016-06-05: qty 1

## 2016-06-05 MED ORDER — SODIUM CHLORIDE 0.9 % IV SOLN
INTRAVENOUS | Status: DC | PRN
  Administered 2016-06-05: 12:00:00 via INTRAVENOUS

## 2016-06-05 MED ORDER — SODIUM CHLORIDE 0.9 % IV SOLN
INTRAVENOUS | Status: DC
  Administered 2016-06-05 – 2016-06-06 (×2): via INTRAVENOUS

## 2016-06-05 MED ORDER — ROCURONIUM BROMIDE 50 MG/5ML IV SOSY
PREFILLED_SYRINGE | INTRAVENOUS | Status: AC
  Filled 2016-06-05: qty 5

## 2016-06-05 MED ORDER — PHENYLEPHRINE HCL 10 MG/ML IJ SOLN
INTRAMUSCULAR | Status: AC
  Filled 2016-06-05: qty 1

## 2016-06-05 MED ORDER — SODIUM CHLORIDE 0.9 % IV SOLN: 250.0000 mL | INTRAVENOUS | Status: DC

## 2016-06-05 MED ORDER — TRIAMCINOLONE ACETONIDE 0.1 % EX OINT
1.0000 "application " | TOPICAL_OINTMENT | Freq: Two times a day (BID) | CUTANEOUS | Status: DC | PRN
  Filled 2016-06-05: qty 15

## 2016-06-05 MED ORDER — VANCOMYCIN HCL IN DEXTROSE 1-5 GM/200ML-% IV SOLN
1000.0000 mg | Freq: Once | INTRAVENOUS | Status: AC
  Administered 2016-06-05: 1000 mg via INTRAVENOUS
  Filled 2016-06-05 (×2): qty 200

## 2016-06-05 MED ORDER — VANCOMYCIN HCL IN DEXTROSE 1-5 GM/200ML-% IV SOLN
INTRAVENOUS | Status: AC
  Filled 2016-06-05: qty 200

## 2016-06-05 MED ORDER — BISACODYL 10 MG RE SUPP: 10.0000 mg | Freq: Every day | RECTAL | Status: DC | PRN

## 2016-06-05 MED ORDER — SUGAMMADEX SODIUM 200 MG/2ML IV SOLN
INTRAVENOUS | Status: AC
  Filled 2016-06-05: qty 2

## 2016-06-05 MED ORDER — PROPOFOL 10 MG/ML IV BOLUS
INTRAVENOUS | Status: AC
  Filled 2016-06-05: qty 20

## 2016-06-05 MED ORDER — ROPINIROLE HCL 1 MG PO TABS
2.5000 mg | ORAL_TABLET | Freq: Every day | ORAL | Status: DC
  Administered 2016-06-05 – 2016-06-07 (×3): 2.5 mg via ORAL
  Administered 2016-06-08: 23:00:00 via ORAL
  Filled 2016-06-05 (×4): qty 3

## 2016-06-05 MED ORDER — HYDROMORPHONE HCL 1 MG/ML IJ SOLN: 0.5000 mg | INTRAMUSCULAR | Status: DC | PRN

## 2016-06-05 MED ORDER — MEPERIDINE HCL 25 MG/ML IJ SOLN
25.0000 mg | INTRAMUSCULAR | Status: DC | PRN
  Administered 2016-06-05: 25 mg via INTRAVENOUS

## 2016-06-05 MED ORDER — DIPHENHYDRAMINE HCL 50 MG/ML IJ SOLN
INTRAMUSCULAR | Status: AC
  Filled 2016-06-05: qty 1

## 2016-06-05 MED ORDER — PHENYLEPHRINE HCL 10 MG/ML IJ SOLN
INTRAVENOUS | Status: DC | PRN
  Administered 2016-06-05: 30 ug/min via INTRAVENOUS
  Administered 2016-06-05: 12:00:00 via INTRAVENOUS

## 2016-06-05 MED ORDER — THROMBIN 20000 UNITS EX SOLR
CUTANEOUS | Status: DC | PRN
  Administered 2016-06-05: 20 mL via TOPICAL

## 2016-06-05 MED ORDER — ONDANSETRON HCL 4 MG/2ML IJ SOLN
4.0000 mg | INTRAMUSCULAR | Status: DC | PRN
  Administered 2016-06-08 – 2016-06-09 (×3): 4 mg via INTRAVENOUS
  Filled 2016-06-05 (×3): qty 2

## 2016-06-05 MED ORDER — PROPOFOL 10 MG/ML IV BOLUS
INTRAVENOUS | Status: DC | PRN
  Administered 2016-06-05: 150 mg via INTRAVENOUS
  Administered 2016-06-05: 50 mg via INTRAVENOUS

## 2016-06-05 MED ORDER — POLYETHYLENE GLYCOL 3350 17 G PO PACK: 17.0000 g | PACK | Freq: Every day | ORAL | Status: DC | PRN

## 2016-06-05 MED ORDER — SENNA 8.6 MG PO TABS
1.0000 | ORAL_TABLET | Freq: Two times a day (BID) | ORAL | Status: DC
  Administered 2016-06-05 – 2016-06-09 (×7): 8.6 mg via ORAL
  Filled 2016-06-05 (×7): qty 1

## 2016-06-05 MED ORDER — GABAPENTIN 400 MG PO CAPS
1800.0000 mg | ORAL_CAPSULE | Freq: Every day | ORAL | Status: DC
  Administered 2016-06-05 – 2016-06-08 (×4): 1800 mg via ORAL
  Filled 2016-06-05 (×4): qty 2

## 2016-06-05 MED ORDER — LIDOCAINE HCL (PF) 1 % IJ SOLN
INTRAMUSCULAR | Status: AC
  Filled 2016-06-05: qty 30

## 2016-06-05 MED ORDER — ONDANSETRON HCL 4 MG/2ML IJ SOLN
INTRAMUSCULAR | Status: DC | PRN
Start: 2016-06-05 — End: 2016-06-05
  Administered 2016-06-05: 4 mg via INTRAVENOUS

## 2016-06-05 MED ORDER — ENOXAPARIN SODIUM 40 MG/0.4ML ~~LOC~~ SOLN
40.0000 mg | SUBCUTANEOUS | Status: DC
  Administered 2016-06-06 – 2016-06-09 (×4): 40 mg via SUBCUTANEOUS
  Filled 2016-06-05 (×4): qty 0.4

## 2016-06-05 MED ORDER — GABAPENTIN 300 MG PO CAPS: 600.0000 mg | ORAL_CAPSULE | ORAL | Status: DC

## 2016-06-05 MED ORDER — PANTOPRAZOLE SODIUM 40 MG PO TBEC
40.0000 mg | DELAYED_RELEASE_TABLET | Freq: Every day | ORAL | Status: DC
  Administered 2016-06-05 – 2016-06-09 (×5): 40 mg via ORAL
  Filled 2016-06-05 (×5): qty 1

## 2016-06-05 MED ORDER — LACTATED RINGERS IV SOLN
INTRAVENOUS | Status: DC | PRN
  Administered 2016-06-05 (×3): via INTRAVENOUS

## 2016-06-05 MED ORDER — SODIUM CHLORIDE 0.9% FLUSH
3.0000 mL | INTRAVENOUS | Status: DC | PRN
  Administered 2016-06-07: 3 mL via INTRAVENOUS
  Filled 2016-06-05: qty 3

## 2016-06-05 MED ORDER — FENTANYL CITRATE (PF) 100 MCG/2ML IJ SOLN
INTRAMUSCULAR | Status: AC
  Filled 2016-06-05: qty 2

## 2016-06-05 MED ORDER — ARTIFICIAL TEARS OP OINT
TOPICAL_OINTMENT | OPHTHALMIC | Status: AC
  Filled 2016-06-05: qty 3.5

## 2016-06-05 MED ORDER — ARTIFICIAL TEARS OP OINT
TOPICAL_OINTMENT | OPHTHALMIC | Status: DC | PRN
  Administered 2016-06-05: 1 via OPHTHALMIC

## 2016-06-05 MED ORDER — LIDOCAINE 2% (20 MG/ML) 5 ML SYRINGE
INTRAMUSCULAR | Status: AC
  Filled 2016-06-05: qty 5

## 2016-06-05 MED ORDER — 0.9 % SODIUM CHLORIDE (POUR BTL) OPTIME
TOPICAL | Status: DC | PRN
  Administered 2016-06-05: 1000 mL

## 2016-06-05 MED ORDER — DEXAMETHASONE SODIUM PHOSPHATE 10 MG/ML IJ SOLN
INTRAMUSCULAR | Status: DC | PRN
  Administered 2016-06-05: 10 mg via INTRAVENOUS

## 2016-06-05 MED ORDER — ALUM & MAG HYDROXIDE-SIMETH 200-200-20 MG/5ML PO SUSP: 30.0000 mL | Freq: Four times a day (QID) | ORAL | Status: DC | PRN

## 2016-06-05 MED ORDER — KETOROLAC TROMETHAMINE 15 MG/ML IJ SOLN
INTRAMUSCULAR | Status: AC
  Filled 2016-06-05: qty 1

## 2016-06-05 MED ORDER — ONDANSETRON HCL 4 MG/2ML IJ SOLN: 4.0000 mg | Freq: Once | INTRAMUSCULAR | Status: DC | PRN

## 2016-06-05 MED ORDER — CYCLOBENZAPRINE HCL 10 MG PO TABS
10.0000 mg | ORAL_TABLET | Freq: Two times a day (BID) | ORAL | Status: DC | PRN
  Administered 2016-06-06 – 2016-06-07 (×3): 10 mg via ORAL
  Filled 2016-06-05 (×5): qty 1

## 2016-06-05 MED ORDER — HYDROXYZINE PAMOATE 25 MG PO CAPS
25.0000 mg | ORAL_CAPSULE | Freq: Three times a day (TID) | ORAL | Status: DC | PRN
  Filled 2016-06-05 (×2): qty 1

## 2016-06-05 MED ORDER — PHENYLEPHRINE 40 MCG/ML (10ML) SYRINGE FOR IV PUSH (FOR BLOOD PRESSURE SUPPORT)
PREFILLED_SYRINGE | INTRAVENOUS | Status: DC | PRN
  Administered 2016-06-05: 120 ug via INTRAVENOUS
  Administered 2016-06-05: 80 ug via INTRAVENOUS
  Administered 2016-06-05: 120 ug via INTRAVENOUS
  Administered 2016-06-05: 80 ug via INTRAVENOUS

## 2016-06-05 MED ORDER — PHENOL 1.4 % MT LIQD: 1.0000 | OROMUCOSAL | Status: DC | PRN

## 2016-06-05 MED ORDER — METHOCARBAMOL 500 MG PO TABS
500.0000 mg | ORAL_TABLET | Freq: Four times a day (QID) | ORAL | Status: DC | PRN
  Filled 2016-06-05: qty 1

## 2016-06-05 MED ORDER — ACETAMINOPHEN 650 MG RE SUPP: 650.0000 mg | RECTAL | Status: DC | PRN

## 2016-06-05 MED ORDER — METOPROLOL SUCCINATE ER 25 MG PO TB24
25.0000 mg | ORAL_TABLET | Freq: Every morning | ORAL | Status: DC
  Administered 2016-06-07 – 2016-06-09 (×3): 25 mg via ORAL
  Filled 2016-06-05 (×3): qty 1

## 2016-06-05 MED ORDER — OXYCODONE-ACETAMINOPHEN 5-325 MG PO TABS: 1.0000 | ORAL_TABLET | ORAL | Status: DC | PRN

## 2016-06-05 MED ORDER — MEPERIDINE HCL 50 MG/5ML PO SOLN
50.0000 mg | ORAL | Status: DC | PRN
  Filled 2016-06-05: qty 5

## 2016-06-05 MED ORDER — ROPINIROLE HCL 1 MG PO TABS: 2.0000 mg | ORAL_TABLET | Freq: Every day | ORAL | Status: DC

## 2016-06-05 MED ORDER — FENTANYL CITRATE (PF) 100 MCG/2ML IJ SOLN
INTRAMUSCULAR | Status: DC | PRN
  Administered 2016-06-05 (×2): 50 ug via INTRAVENOUS
  Administered 2016-06-05: 150 ug via INTRAVENOUS
  Administered 2016-06-05: 50 ug via INTRAVENOUS

## 2016-06-05 MED ORDER — PHENYLEPHRINE 40 MCG/ML (10ML) SYRINGE FOR IV PUSH (FOR BLOOD PRESSURE SUPPORT)
PREFILLED_SYRINGE | INTRAVENOUS | Status: AC
  Filled 2016-06-05: qty 10

## 2016-06-05 MED ORDER — DIPHENHYDRAMINE HCL 50 MG/ML IJ SOLN
INTRAMUSCULAR | Status: DC | PRN
  Administered 2016-06-05: 25 mg via INTRAVENOUS

## 2016-06-05 MED ORDER — SODIUM CHLORIDE 0.9 % IV SOLN
INTRAVENOUS | Status: DC | PRN
  Administered 2016-06-05: 1000 mg via INTRAVENOUS

## 2016-06-05 MED ORDER — SODIUM CHLORIDE 0.9 % IV SOLN
0.0000 ug/min | INTRAVENOUS | Status: DC
  Administered 2016-06-05: 40 ug/min via INTRAVENOUS
  Filled 2016-06-05: qty 1

## 2016-06-05 MED ORDER — BUPIVACAINE HCL (PF) 0.5 % IJ SOLN
INTRAMUSCULAR | Status: DC | PRN
  Administered 2016-06-05: 10 mL
  Administered 2016-06-05: 25 mL

## 2016-06-05 MED ORDER — ONDANSETRON HCL 4 MG/2ML IJ SOLN
INTRAMUSCULAR | Status: AC
  Filled 2016-06-05: qty 2

## 2016-06-05 MED ORDER — LIDOCAINE 2% (20 MG/ML) 5 ML SYRINGE
INTRAMUSCULAR | Status: DC | PRN
  Administered 2016-06-05: 100 mg via INTRAVENOUS

## 2016-06-05 MED ORDER — METHOCARBAMOL 1000 MG/10ML IJ SOLN
500.0000 mg | Freq: Four times a day (QID) | INTRAMUSCULAR | Status: DC | PRN
  Administered 2016-06-05: 500 mg via INTRAVENOUS
  Filled 2016-06-05: qty 5

## 2016-06-05 MED ORDER — BUPIVACAINE HCL (PF) 0.5 % IJ SOLN
INTRAMUSCULAR | Status: AC
  Filled 2016-06-05: qty 30

## 2016-06-05 MED ORDER — DEXAMETHASONE 2 MG PO TABS
2.0000 mg | ORAL_TABLET | Freq: Two times a day (BID) | ORAL | Status: DC
  Administered 2016-06-05 – 2016-06-09 (×8): 2 mg via ORAL
  Filled 2016-06-05 (×8): qty 1

## 2016-06-05 MED ORDER — LORATADINE 10 MG PO TABS
10.0000 mg | ORAL_TABLET | Freq: Every day | ORAL | Status: DC
  Administered 2016-06-06 – 2016-06-09 (×4): 10 mg via ORAL
  Filled 2016-06-05 (×4): qty 1

## 2016-06-05 MED ORDER — MENTHOL 3 MG MT LOZG: 1.0000 | LOZENGE | OROMUCOSAL | Status: DC | PRN

## 2016-06-05 MED ORDER — DEXAMETHASONE SODIUM PHOSPHATE 4 MG/ML IJ SOLN
2.0000 mg | Freq: Two times a day (BID) | INTRAMUSCULAR | Status: DC
  Filled 2016-06-05: qty 1

## 2016-06-05 MED ORDER — LORAZEPAM 1 MG PO TABS
1.0000 mg | ORAL_TABLET | Freq: Four times a day (QID) | ORAL | Status: DC | PRN
  Administered 2016-06-05 – 2016-06-08 (×4): 1 mg via ORAL
  Filled 2016-06-05 (×5): qty 1

## 2016-06-05 MED ORDER — SODIUM CHLORIDE 0.9 % IR SOLN
Status: DC | PRN
  Administered 2016-06-05: 500 mL

## 2016-06-05 MED ORDER — FENTANYL CITRATE (PF) 100 MCG/2ML IJ SOLN: 25.0000 ug | INTRAMUSCULAR | Status: DC | PRN

## 2016-06-05 MED FILL — Heparin Sodium (Porcine) Inj 1000 Unit/ML: INTRAMUSCULAR | Qty: 30 | Status: AC

## 2016-06-05 MED FILL — Sodium Chloride IV Soln 0.9%: INTRAVENOUS | Qty: 1000 | Status: AC

## 2016-06-05 SURGICAL SUPPLY — 72 items
ADH SKN CLS APL DERMABOND .7 (GAUZE/BANDAGES/DRESSINGS) ×1
APL SRG 60D 8 XTD TIP BNDBL (TIP)
BAG DECANTER FOR FLEXI CONT (MISCELLANEOUS) ×2 IMPLANT
BASKET BONE COLLECTION (BASKET) ×2 IMPLANT
BLADE CLIPPER SURG (BLADE) IMPLANT
BONE EQUIVA 10CC (Bone Implant) ×1 IMPLANT
BUR MATCHSTICK NEURO 3.0 LAGG (BURR) ×2 IMPLANT
CANISTER SUCT 3000ML PPV (MISCELLANEOUS) ×2 IMPLANT
CARTRIDGE OIL MAESTRO DRILL (MISCELLANEOUS) ×1 IMPLANT
CONT SPEC 4OZ CLIKSEAL STRL BL (MISCELLANEOUS) ×2 IMPLANT
COVER BACK TABLE 60X90IN (DRAPES) ×2 IMPLANT
DECANTER SPIKE VIAL GLASS SM (MISCELLANEOUS) ×1 IMPLANT
DERMABOND ADVANCED (GAUZE/BANDAGES/DRESSINGS) ×1
DERMABOND ADVANCED .7 DNX12 (GAUZE/BANDAGES/DRESSINGS) ×1 IMPLANT
DEVICE DISSECT PLASMABLAD 3.0S (MISCELLANEOUS) ×1 IMPLANT
DIFFUSER DRILL AIR PNEUMATIC (MISCELLANEOUS) ×2 IMPLANT
DRAPE C-ARM 42X72 X-RAY (DRAPES) ×4 IMPLANT
DRAPE HALF SHEET 40X57 (DRAPES) IMPLANT
DRAPE LAPAROTOMY 100X72X124 (DRAPES) ×2 IMPLANT
DRAPE MICROSCOPE LEICA (MISCELLANEOUS) ×1 IMPLANT
DRAPE POUCH INSTRU U-SHP 10X18 (DRAPES) ×2 IMPLANT
DRSG OPSITE POSTOP 4X8 (GAUZE/BANDAGES/DRESSINGS) ×1 IMPLANT
DURAPREP 26ML APPLICATOR (WOUND CARE) ×2 IMPLANT
DURASEAL APPLICATOR TIP (TIP) IMPLANT
DURASEAL SPINE SEALANT 3ML (MISCELLANEOUS) IMPLANT
ELECT REM PT RETURN 9FT ADLT (ELECTROSURGICAL) ×2
ELECTRODE REM PT RTRN 9FT ADLT (ELECTROSURGICAL) ×1 IMPLANT
GAUZE SPONGE 4X4 12PLY STRL (GAUZE/BANDAGES/DRESSINGS) ×2 IMPLANT
GAUZE SPONGE 4X4 16PLY XRAY LF (GAUZE/BANDAGES/DRESSINGS) ×1 IMPLANT
GLOVE BIOGEL PI IND STRL 8.5 (GLOVE) ×2 IMPLANT
GLOVE BIOGEL PI INDICATOR 8.5 (GLOVE) ×2
GLOVE ECLIPSE 8.5 STRL (GLOVE) ×4 IMPLANT
GLOVE SS N UNI LF 6.5 STRL (GLOVE) ×1 IMPLANT
GLOVE SS N UNI LF 7.0 STRL (GLOVE) ×2 IMPLANT
GOWN STRL REUS W/ TWL LRG LVL3 (GOWN DISPOSABLE) IMPLANT
GOWN STRL REUS W/ TWL XL LVL3 (GOWN DISPOSABLE) IMPLANT
GOWN STRL REUS W/TWL 2XL LVL3 (GOWN DISPOSABLE) ×4 IMPLANT
GOWN STRL REUS W/TWL LRG LVL3 (GOWN DISPOSABLE)
GOWN STRL REUS W/TWL XL LVL3 (GOWN DISPOSABLE) ×4
HEMOSTAT POWDER KIT SURGIFOAM (HEMOSTASIS) ×1 IMPLANT
KIT BASIN OR (CUSTOM PROCEDURE TRAY) ×2 IMPLANT
KIT ROOM TURNOVER OR (KITS) ×2 IMPLANT
NDL SPNL 18GX3.5 QUINCKE PK (NEEDLE) IMPLANT
NEEDLE HYPO 22GX1.5 SAFETY (NEEDLE) ×2 IMPLANT
NEEDLE SPNL 18GX3.5 QUINCKE PK (NEEDLE) IMPLANT
NS IRRIG 1000ML POUR BTL (IV SOLUTION) ×2 IMPLANT
OIL CARTRIDGE MAESTRO DRILL (MISCELLANEOUS)
PACK LAMINECTOMY NEURO (CUSTOM PROCEDURE TRAY) ×2 IMPLANT
PAD ARMBOARD 7.5X6 YLW CONV (MISCELLANEOUS) ×6 IMPLANT
PATTIES SURGICAL .5 X1 (DISPOSABLE) ×2 IMPLANT
PATTIES SURGICAL 1X1 (DISPOSABLE) ×1 IMPLANT
PLASMABLADE 3.0S (MISCELLANEOUS) ×2
ROD TI CVD VIT 5.5X50 (Rod) ×1 IMPLANT
ROD TI CVD VIT 5.5X55 (Rod) ×1 IMPLANT
RUBBERBAND STERILE (MISCELLANEOUS) ×2 IMPLANT
SCREW VITALITY PA 6.5X45MM (Screw) ×1 IMPLANT
SPACER STR 7HX25LX10WX8 DEG PK (Spacer) ×2 IMPLANT
SPACER ZYSTON STRT 8X25X10WXX8 (Spacer) ×1 IMPLANT
SPONGE LAP 4X18 X RAY DECT (DISPOSABLE) IMPLANT
SPONGE SURGIFOAM ABS GEL 100 (HEMOSTASIS) ×2 IMPLANT
SUT PROLENE 6 0 BV (SUTURE) ×1 IMPLANT
SUT VIC AB 1 CT1 18XBRD ANBCTR (SUTURE) ×1 IMPLANT
SUT VIC AB 1 CT1 8-18 (SUTURE) ×4
SUT VIC AB 2-0 CP2 18 (SUTURE) ×3 IMPLANT
SUT VIC AB 3-0 SH 8-18 (SUTURE) ×3 IMPLANT
SYR 3ML LL SCALE MARK (SYRINGE) ×8 IMPLANT
SYR 5ML LL (SYRINGE) IMPLANT
TOP CLOSURE TORQ LIMIT (Neuro Prosthesis/Implant) ×6 IMPLANT
TOWEL OR 17X24 6PK STRL BLUE (TOWEL DISPOSABLE) ×2 IMPLANT
TOWEL OR 17X26 10 PK STRL BLUE (TOWEL DISPOSABLE) ×2 IMPLANT
TRAY FOLEY W/METER SILVER 16FR (SET/KITS/TRAYS/PACK) ×2 IMPLANT
WATER STERILE IRR 1000ML POUR (IV SOLUTION) ×2 IMPLANT

## 2016-06-05 NOTE — Progress Notes (Signed)
Pharmacy Antibiotic Note [name] is a 61 y.o. female admitted on 06/05/2016 with surgical prophylaxis. Pt is s/p lumbar laminectomy with no drain in place per op note and d/w RN. Pharmacy has been consulted for vancomycin dosing. No pre op dose documented as given.   Plan: 1. Vancomycin 1000 mg IV x 1 2. Pharmacy to sign off please re consult if needed   Height: 5' 2.5" (158.8 cm) Weight: 189 lb 14 oz (86.1 kg) IBW/kg (Calculated) : 51.25  Temp (24hrs), Avg:98.2 F (36.8 C), Min:97.2 F (36.2 C), Max:98.9 F (37.2 C)   Recent Labs Lab 05/30/16 0902  WBC 8.8  CREATININE 0.86    Estimated Creatinine Clearance: 72.5 mL/min (by C-G formula based on SCr of 0.86 mg/dL).    Allergies  Allergen Reactions  . Amoxicillin Rash and Other (See Comments)    REACTION: Stomach bleeding  . Ampicillin Rash and Other (See Comments)    REACTION: Stomach bleeding  . Aspirin Other (See Comments)    REACTION: ulcer and GI bleeding. Patient states she can take the 81mg  aspirin-NO 325MG   . Chlorzoxazone Rash and Other (See Comments)    Caused pleural effusion/excessive fluid buildup  . Ciprofloxacin Anaphylaxis    REACTION: Throat swells shut  . Heparin Other (See Comments)    MAKES HEART STOP BEATING.   . Indomethacin Other (See Comments)    MAKES HEART STOP BEATING.   . Nsaids Other (See Comments)    REACTION: Stomach bleeding  . Albumin (Human) Rash and Other (See Comments)    Unknown other reactions  . Cephalexin Hives, Itching and Rash  . Chlorpromazine Rash and Other (See Comments)    hyperactivity  . Darvocet [Propoxyphene N-Acetaminophen] Itching, Nausea And Vomiting and Rash  . Dilaudid [Hydromorphone Hcl] Itching, Nausea And Vomiting and Swelling  . Fentanyl Hives and Nausea And Vomiting  . Hydrocodone Nausea And Vomiting  . Ketorolac Tromethamine Itching, Nausea And Vomiting and Rash  . Nortriptyline Hcl Itching, Rash and Other (See Comments)    REACTION: Hyperactive   . Oxycodone Nausea And Vomiting  . Pentazocine Nausea And Vomiting and Rash  . Pentazocine-Naloxone Nausea And Vomiting  . Percocet [Oxycodone-Acetaminophen] Itching, Nausea And Vomiting and Rash  . Propoxyphene Itching, Nausea And Vomiting and Rash  . Sertraline Hcl Other (See Comments)    REACTION: Hyperactive - bounce off the wall  . Tramadol Hcl Nausea And Vomiting  . Codeine Rash  . Hyoscyamine Sulfate Other (See Comments)  . Imipramine Itching and Rash  . Tramadol Rash    Pollyann SamplesAndy Wally Shevchenko, PharmD, BCPS 06/05/2016, 6:05 PM
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
## 2016-06-05 NOTE — Anesthesia Procedure Notes (Signed)
Procedure Name: Intubation Date/Time: 06/05/2016 7:41 AM Performed by: Rise PatienceBELL, Mirko Tailor T Pre-anesthesia Checklist: Patient identified, Emergency Drugs available, Suction available and Patient being monitored Patient Re-evaluated:Patient Re-evaluated prior to inductionOxygen Delivery Method: Circle System Utilized Preoxygenation: Pre-oxygenation with 100% oxygen Intubation Type: IV induction Ventilation: Mask ventilation without difficulty and Oral airway inserted - appropriate to patient size Laryngoscope Size: Hyacinth MeekerMiller and 2 Grade View: Grade I Tube type: Oral Tube size: 7.5 mm Number of attempts: 1 Airway Equipment and Method: Stylet and Oral airway Placement Confirmation: ETT inserted through vocal cords under direct vision,  positive ETCO2 and breath sounds checked- equal and bilateral Secured at: 22 cm Tube secured with: Tape Dental Injury: Teeth and Oropharynx as per pre-operative assessment

## 2016-06-05 NOTE — Transfer of Care (Signed)
Immediate Anesthesia Transfer of Care Note  Patient: [name]  Procedure(s) Performed: Procedure(s) with comments: Posterior Lumbar Four-FIve/Five-Six  Interbody and Fusion (N/A) - Posterior Lumbar Four-FIve/Five-Six  Interbody and Fusion with Cages and Pedicle Screws  Patient Location: PACU  Anesthesia Type:General  Level of Consciousness: awake, alert  and oriented  Airway & Oxygen Therapy: Patient Spontanous Breathing and Patient connected to nasal cannula oxygen  Post-op Assessment: Report given to RN, Post -op Vital signs reviewed and stable and Patient moving all extremities X 4  Post vital signs: Reviewed and stable  Last Vitals:  Vitals:   06/05/16 0613  BP: 128/65  Pulse: 91  Resp: 20  Temp: 37.2 C    Last Pain:  Vitals:   06/05/16 0709  TempSrc:   PainSc: 3          Complications: No apparent anesthesia complications

## 2016-06-05 NOTE — Anesthesia Postprocedure Evaluation (Addendum)
Anesthesia Post Note  Patient: [name]  Procedure(s) Performed: Procedure(s) (LRB): Posterior Lumbar Four-FIve/Five-Six  Interbody and Fusion (N/A)  Patient location during evaluation: PACU Anesthesia Type: General Level of consciousness: awake Pain management: pain level controlled Vital Signs Assessment: post-procedure vital signs reviewed and stable Respiratory status: spontaneous breathing Cardiovascular status: stable Postop Assessment: no signs of nausea or vomiting Anesthetic complications: no        Last Vitals:  Vitals:   06/05/16 0613 06/05/16 1330  BP: 128/65 (!) 84/57  Pulse: 91 87  Resp: 20 (!) 9  Temp: 37.2 C     Last Pain:  Vitals:   06/05/16 0709  TempSrc:   PainSc: 3    Pain Goal:                 Airam Runions JR,JOHN Farra Nikolic
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
## 2016-06-06 MED ORDER — MORPHINE SULFATE (PF) 2 MG/ML IV SOLN
2.0000 mg | INTRAVENOUS | Status: DC | PRN
  Administered 2016-06-06 – 2016-06-09 (×9): 2 mg via INTRAVENOUS
  Filled 2016-06-06 (×9): qty 1

## 2016-06-06 MED FILL — Thrombin For Soln 5000 Unit: CUTANEOUS | Qty: 5000 | Status: AC

## 2016-06-06 NOTE — Progress Notes (Signed)
Patient's BP is low at 85/43, patient is asymptomatic at this time. Patient's temp slightly elevated at 100.3, incentive spirometer given, education provided and patient demonstrated understanding. Will continue to monitor.

## 2016-06-06 NOTE — Care Management Note (Signed)
Case Management Note  Patient Details  Name: [name] MRN: [id number] Date of Birth: Jul 15, 1955  Subjective/Objective:             Patient was admitted for a PLIF. CM will follow for discharge needs pending PT/OT evals and physician orders.        Action/Plan:   Expected Discharge Date:                  Expected Discharge Plan:     In-House Referral:     Discharge planning Services     Post Acute Care Choice:    Choice offered to:     DME Arranged:    DME Agency:     HH Arranged:    HH Agency:     Status of Service:     If discussed at [employer] Length of Stay Meetings, dates discussed:    Additional Comments:  Anda KraftRobarge, Brexlee Heberlein C, RN 06/06/2016, 10:16 AM

## 2016-06-06 NOTE — Progress Notes (Signed)
Patient ID: Tracy BlaseCheryl C Savage, female   DOB: 08-21-1955, 61 y.o.   MRN: [id number] Vital signs are stable Motor function appears intact Dressing appears clean and dry Pain control is been somewhat difficult Morphine seems to give her a better edge on pain control. Patient would like to be placed and skilled nursing facility for further rehabilitation.

## 2016-06-06 NOTE — Clinical Social Work Placement (Signed)
   CLINICAL SOCIAL WORK PLACEMENT  NOTE  Date:  06/06/2016  Patient Details  Name: [name] MRN: [id number] Date of Birth: 1955/07/10  Clinical Social Work is seeking post-discharge placement for this patient at the Skilled  Nursing Facility level of care (*CSW will initial, date and re-position this form in  chart as items are completed):  Yes   Patient/family provided with [facility] Clinical Social Work Department's list of facilities offering this level of care within the geographic area requested by the patient (or if unable, by the patient's family).  Yes   Patient/family informed of their freedom to choose among providers that offer the needed level of care, that participate in Medicare, Medicaid or managed care program needed by the patient, have an available bed and are willing to accept the patient.  Yes   Patient/family informed of 's [facility] interest [facility] and [facility], as well as of the fact that they are under no obligation to receive care at these facilities.  PASRR submitted to EDS on 06/06/16     PASRR number received on       Existing PASRR number confirmed on       FL2 transmitted to all facilities in geographic area requested by pt/family on 06/06/16     FL2 transmitted to all facilities within larger geographic area on       Patient informed that his/her managed care company has contracts with or will negotiate with certain facilities, including the following:            Patient/family informed of bed offers received.  Patient chooses bed at       Physician recommends and patient chooses bed at      Patient to be transferred to   on  .  Patient to be transferred to facility by       Patient family notified on   of transfer.  Name of family member notified:        PHYSICIAN       Additional Comment:    _______________________________________________ Dede QuerySarah Arlington Sigmund, LCSW 06/06/2016, 4:28 PM

## 2016-06-06 NOTE — Evaluation (Signed)
Physical Therapy Evaluation Patient Details Name: [name] MRN: [id number] DOB: [birth year] Today's Date: 06/06/2016   History of Present Illness  Patient is a 61 y/o female admitted with Lumbar spondylolisthesis L4-5 and L5 L6 with lumbar radiculopathy. History of previous decompression L5 L6.  Now s/p Lumbar laminectomy L4-5 and L5-S1 with posterior lumbar interbody arthrodesis L4-5 and L5-L6 segmental fixation L4 to L6  Clinical Impression  Patient presents with decreased independence with mobility due to pain, weakness and limitations due to precautions.  Currently at minguard A level (previously independent) and feel she will benefit from skilled PT in the acute setting to allow return home following STSNF rehab stay.     Follow Up Recommendations SNF    Equipment Recommendations  Rolling walker with 5" wheels    Recommendations for Other Services       Precautions / Restrictions Precautions Precautions: Fall;Back Precaution Booklet Issued: Yes (comment) Required Braces or Orthoses: Spinal Brace Spinal Brace: Lumbar corset;Applied in sitting position      Mobility  Bed Mobility Overal bed mobility: Needs Assistance Bed Mobility: Rolling;Sidelying to Sit Rolling: Supervision Sidelying to sit: Supervision       General bed mobility comments: with rail and assist for safety, precautions  Transfers Overall transfer level: Needs assistance Equipment used: Rolling walker (2 wheeled) Transfers: Sit to/from Stand Sit to Stand: Supervision         General transfer comment: cues for hand placement  Ambulation/Gait Ambulation/Gait assistance: Min guard;Supervision Ambulation Distance (Feet): 140 Feet Assistive device: Rolling walker (2 wheeled) Gait Pattern/deviations: Step-through pattern;Decreased stride length     General Gait Details: stopped x 2 for rest due to pain in thighs, assist for balance/safety  Stairs            Wheelchair Mobility     Modified Rankin (Stroke Patients Only)       Balance Overall balance assessment: Needs assistance   Sitting balance-Leahy Scale: Good       Standing balance-Leahy Scale: Poor Standing balance comment: UE support for balance                             Pertinent Vitals/Pain Pain Assessment: 0-10 Pain Score: 7  Pain Location: Bilateral prox ant thighs Pain Descriptors / Indicators: Discomfort;Aching Pain Intervention(s): Monitored during session;Repositioned    Home Living Family/patient expects to be discharged to:: Skilled nursing facility [facility]) Living Arrangements: Other relatives (grandaughters: one 8 y/o, other works and goes to school)                    Prior Function Level of Independence: Independent               Higher education careers adviser        Extremity/Trunk Assessment   Upper Extremity Assessment Upper Extremity Assessment: Overall WFL for tasks assessed    Lower Extremity Assessment Lower Extremity Assessment: Generalized weakness       Communication   Communication: No difficulties  Cognition Arousal/Alertness: Awake/alert Behavior During Therapy: WFL for tasks assessed/performed Overall Cognitive Status: Within Functional Limits for tasks assessed                      General Comments      Exercises     Assessment/Plan    PT Assessment Patient needs continued PT services  PT Problem List Decreased strength;Decreased mobility;Pain;Decreased knowledge of use of DME;Decreased activity tolerance;Decreased knowledge of  precautions;Decreased balance          PT Treatment Interventions DME instruction;Gait training;Therapeutic activities;Therapeutic exercise;Patient/family education;Balance training;Functional mobility training;Stair training    PT Goals (Current goals can be found in the Care Plan section)  Acute Rehab PT Goals Patient Stated Goal: To go to rehab PT Goal Formulation: With patient Time  For Goal Achievement: 06/13/16 Potential to Achieve Goals: Good    Frequency Min 5X/week   Barriers to discharge        Co-evaluation               End of Session Equipment Utilized During Treatment: Back brace Activity Tolerance: Patient limited by pain Patient left: in chair;with call bell/phone within reach           Time: 1157-1222 PT Time Calculation (min) (ACUTE ONLY): 25 min   Charges:   PT Evaluation $PT Eval Moderate Complexity: 1 Procedure PT Treatments $Gait Training: 8-22 mins   PT G CodesElray Mcgregor:        Gerardo Territo 06/06/2016, 12:47 PM  Sheran Lawlessyndi Jenene Kauffmann, PT [phone] 06/06/2016

## 2016-06-06 NOTE — Progress Notes (Signed)
Patient encouraged by writer to walk, patient was only able to sit at the edge of the bed and complained of dizziness. Patient encouraged to use incentive spirometer, 2000 achieved.

## 2016-06-06 NOTE — Progress Notes (Signed)
PT Cancellation Note  Patient Details Name: [name] MRN: [id number] DOB: 02-01-1956   Cancelled Treatment:    Reason Eval/Treat Not Completed: Pain limiting ability to participate;Fatigue/lethargy limiting ability to participate; reports just had Morphine to finally control pain and restless night.  Requests to rest until later today.  Will try again later.    Elray McgregorCynthia Wynn 06/06/2016, 10:06 AM  Sheran Lawlessyndi Wynn, PT [phone] 06/06/2016

## 2016-06-06 NOTE — [place] FL2 (Signed)
[place] MEDICAID FL2 LEVEL OF CARE SCREENING TOOL     IDENTIFICATION  Patient Name: [name] Birthdate: 1955-07-17 Sex: female Admission Date (Current Location): 06/05/2016  [facility] and [place] Number:  Producer, television/film/video and Address:  [facility] [place], [address], [place], [place] [address]      Provider Number: [id number]  Attending Physician Name and Address:  Barnett Abu, MD  Relative Name and Phone Number:  Jerlean Peralta (daughter) [phone]    Current Level of Care: Hospital Recommended Level of Care: Skilled Nursing Facility Prior Approval Number:    Date Approved/Denied:   PASRR Number:    Discharge Plan: SNF    Current Diagnoses: Patient Active Problem List   Diagnosis Date Noted  . Spondylolisthesis at L4-L5 level 06/05/2016  . Atypical pneumonia 08/29/2011  . Chest pain 08/29/2011  . Dyspnea 03/15/2011  . OPEN WOUND FT NO TOE ALONE WITHOUT MENTION COMP 05/22/2007  . VIRAL INFECTION 05/07/2007  . NASOPHARYNGITIS, ACUTE 05/07/2007  . OBESITY 03/25/2007  . ANXIETY 03/25/2007  . DEPRESSION 03/25/2007  . CHRONIC PAIN SYNDROME 03/25/2007  . MIGRAINE HEADACHE 03/25/2007  . CARPAL TUNNEL SYNDROME 03/25/2007  . NEUROPATHY 03/25/2007  . ABNORMAL HEART RHYTHMS 03/25/2007  . ALLERGIC RHINITIS 03/25/2007  . BRONCHITIS, CHRONIC 03/25/2007  . ASTHMA 03/25/2007  . GERD 03/25/2007  . PUD 03/25/2007  . IBS 03/25/2007  . ARTHRITIS 03/25/2007  . LOW BACK PAIN, CHRONIC 03/25/2007  . FIBROMYALGIA 03/25/2007  . SEIZURE DISORDER 03/25/2007  . MEMORY LOSS 03/25/2007  . ABDOMINAL PAIN 03/25/2007    Orientation RESPIRATION BLADDER Height & Weight     Self, Time, Situation, Place   Normal Continent Weight: 86.1 kg (189 lb 14 oz) Height:  5' 2.5" (158.8 cm)  BEHAVIORAL SYMPTOMS/MOOD NEUROLOGICAL BOWEL NUTRITION STATUS     (remote history of seizures ) Continent Diet (Full Liquid (Thin))  AMBULATORY STATUS COMMUNICATION OF  NEEDS Skin   Limited Assist Verbally Surgical wounds                       Personal Care Assistance Level of Assistance  Bathing, Feeding, Dressing Bathing Assistance: Limited assistance Feeding assistance: Limited assistance Dressing Assistance: Limited assistance     Functional Limitations Info  Sight, Speech, Hearing Sight Info: Adequate Hearing Info: Adequate Speech Info: Adequate    SPECIAL CARE FACTORS FREQUENCY  PT (By licensed PT)     PT Frequency: 5x/week minimum              Contractures Contractures Info: Not present    Additional Factors Info  Code Status, Allergies Code Status Info: Full Allergies Info: Amoxicillin, Ampicillin, Aspirin, Chlorzoxazone, Ciprofloxacin, Heparin, Indomethacin, Nsaids, Albumin (Human), Cephalexin, Chlorpromazine, Darvocet Propoxyphene N-acetaminophen, Dilaudid Hydromorphone Hcl, Fentanyl, Hydrocodone, Ketorolac Tromethamine, Nortriptyline Hcl, Oxycodone, Pentazocine, Pentazocine-naloxone, Percocet Oxycodone-acetaminophen, Propoxyphene, Sertraline Hcl, Tramadol Hcl, Codeine, Hyoscyamine Sulfate, Imipramine, Tramadol           Current Medications (06/06/2016):  This is the current hospital active medication list Current Facility-Administered Medications  Medication Dose Route Frequency Provider Last Rate Last Dose  . 0.9 %  sodium chloride infusion  250 mL Intravenous Continuous Barnett Abu, MD      . 0.9 %  sodium chloride infusion   Intravenous Continuous Barnett Abu, MD 150 mL/hr at 06/06/16 1135    . acetaminophen (TYLENOL) tablet 650 mg  650 mg Oral Q4H PRN Barnett Abu, MD       Or  . acetaminophen (TYLENOL)  suppository 650 mg  650 mg Rectal Q4H PRN Barnett Abu, MD      . albuterol (PROVENTIL) (2.5 MG/3ML) 0.083% nebulizer solution 2.5 mg  2.5 mg Inhalation Q6H PRN Barnett Abu, MD      . alum & mag hydroxide-simeth (MAALOX/MYLANTA) 200-200-20 MG/5ML suspension 30 mL  30 mL Oral Q6H PRN Barnett Abu, MD      .  bisacodyl (DULCOLAX) suppository 10 mg  10 mg Rectal Daily PRN Barnett Abu, MD      . cyclobenzaprine (FLEXERIL) tablet 10 mg  10 mg Oral BID PRN Barnett Abu, MD   10 mg at 06/06/16 1136  . dexamethasone (DECADRON) injection 2 mg  2 mg Intravenous Q12H Barnett Abu, MD       Or  . dexamethasone (DECADRON) tablet 2 mg  2 mg Oral Q12H Barnett Abu, MD   2 mg at 06/06/16 0846  . docusate sodium (COLACE) capsule 100 mg  100 mg Oral BID Barnett Abu, MD   100 mg at 06/06/16 0846  . enoxaparin (LOVENOX) injection 40 mg  40 mg Subcutaneous Q24H Barnett Abu, MD   40 mg at 06/06/16 0723  . FLUoxetine (PROZAC) capsule 60 mg  60 mg Oral Daily Barnett Abu, MD   60 mg at 06/06/16 0846  . gabapentin (NEURONTIN) capsule 1,800 mg  1,800 mg Oral QHS Barnett Abu, MD   1,800 mg at 06/05/16 2027  . gabapentin (NEURONTIN) capsule 900 mg  900 mg Oral BID WC Barnett Abu, MD   900 mg at 06/06/16 1136  . hydrOXYzine (VISTARIL) capsule 25 mg  25 mg Oral TID PRN Barnett Abu, MD      . levothyroxine (SYNTHROID, LEVOTHROID) tablet 25 mcg  25 mcg Oral QAC breakfast Barnett Abu, MD   25 mcg at 06/06/16 0723  . loratadine (CLARITIN) tablet 10 mg  10 mg Oral Daily Barnett Abu, MD   10 mg at 06/06/16 0846  . LORazepam (ATIVAN) tablet 1 mg  1 mg Oral QID PRN Barnett Abu, MD   1 mg at 06/06/16 0010  . menthol-cetylpyridinium (CEPACOL) lozenge 3 mg  1 lozenge Oral PRN Barnett Abu, MD       Or  . phenol (CHLORASEPTIC) mouth spray 1 spray  1 spray Mouth/Throat PRN Barnett Abu, MD      . meperidine (DEMEROL) injection 25 mg  25 mg Intravenous Q2H PRN Barnett Abu, MD   25 mg at 06/05/16 1506  . meperidine (DEMEROL) tablet 50 mg  50 mg Oral Q4H PRN Barnett Abu, MD   50 mg at 06/06/16 1136  . methocarbamol (ROBAXIN) tablet 500 mg  500 mg Oral Q6H PRN Barnett Abu, MD       Or  . methocarbamol (ROBAXIN) 500 mg in dextrose 5 % 50 mL IVPB  500 mg Intravenous Q6H PRN Barnett Abu, MD   500 mg at 06/05/16 1357  . metoprolol  succinate (TOPROL-XL) 24 hr tablet 25 mg  25 mg Oral q morning - 10a Barnett Abu, MD      . morphine 2 MG/ML injection 2 mg  2 mg Intravenous Q2H PRN Barnett Abu, MD   2 mg at 06/06/16 0845  . ondansetron (ZOFRAN) injection 4 mg  4 mg Intravenous Q4H PRN Barnett Abu, MD      . pantoprazole (PROTONIX) EC tablet 40 mg  40 mg Oral Daily Barnett Abu, MD   40 mg at 06/06/16 0847  . polyethylene glycol (MIRALAX / GLYCOLAX) packet 17 g  17 g  Oral Daily PRN Barnett AbuHenry Aniesa Boback, MD      . rOPINIRole (REQUIP) tablet 2.5 mg  2.5 mg Oral QHS Barnett AbuHenry Milady Fleener, MD   2.5 mg at 06/05/16 2026  . senna (SENOKOT) tablet 8.6 mg  1 tablet Oral BID Barnett AbuHenry Jim Philemon, MD   8.6 mg at 06/06/16 0846  . sodium chloride flush (NS) 0.9 % injection 3 mL  3 mL Intravenous Q12H Barnett AbuHenry Erhard Senske, MD   3 mL at 06/06/16 0847  . sodium chloride flush (NS) 0.9 % injection 3 mL  3 mL Intravenous PRN Barnett AbuHenry Aarianna Hoadley, MD      . sodium phosphate (FLEET) 7-19 GM/118ML enema 1 enema  1 enema Rectal Once PRN Barnett AbuHenry Zerenity Bowron, MD      . triamcinolone ointment (KENALOG) 0.1 % 1 application  1 application Topical BID PRN Barnett AbuHenry Lord Lancour, MD         Discharge Medications: Please see discharge summary for a list of discharge medications.  Relevant Imaging Results:  Relevant Lab Results:   Additional Information SSN:  [id number]  Natalbany LionsLecretia Williamson, Student-Social Work [phone]

## 2016-06-06 NOTE — Clinical Social Work Note (Signed)
Clinical Social Work Assessment  Patient Details  Name: [name] MRN: [id number] Date of Birth: Feb 24, 1956  Date of referral:  06/06/16               Reason for consult:  Facility Placement                Permission sought to share information with:  Family Supports Permission granted to share information::     Name::     Rosealee AlbeeKimberly Swader  Agency::     Relationship::  Daughter  Contact Information:  [phone]  Housing/Transportation Living arrangements for the past 2 months:  Single Family Home Source of Information:  Patient Patient Interpreter Needed:  None Criminal Activity/Legal Involvement Pertinent to Current Situation/Hospitalization:  No - Comment as needed Significant Relationships:  Adult Children Lives with:  Adult Children Do you feel safe going back to the place where you live?  No (Patient feels that she needs short term rehab after discharge) Need for family participation in patient care:  Yes (Comment) Care giving concerns:  Patient feels that she needs short term rehab after discharge   Social Worker assessment / plan:  CSW Intern spoke with patient about discharge plans.  Ms. Levada SchillingSummers stated that she is agreeable to short tern rehab and expressed interest in [facility].  CSW Intern explained the facility search process and and informed the patient that direct contact will be made with [facility].  CSW Intern gave the patient a [place] of [place] and advised her to explore other facilities as a backup option if [facility] declined referral.  Patient also expressed interest in facilities in [place] stating that [place] lives closer to [place].  Ms. Levada SchillingSummers was appreciative of social worker services.   Employment status:  Disabled (Comment on whether or not currently receiving Disability) Insurance information:  Managed Medicare PT Recommendations:  Skilled Nursing Facility Information / Referral to community resources:   Skilled Nursing Facility  Patient/Family's Response to care:  Patient expressed no concerns regarding hospital stay  Patient/Family's Understanding of and Emotional Response to Diagnosis, Current Treatment, and Prognosis:  Not discussedO  Emotional Assessment Appearance:   Older than stated age Attitude/Demeanor/Rapport:  Other (Appropriate) Affect (typically observed):  Accepting, Appropriate Orientation:  Oriented to Self, Oriented to Place, Oriented to  Time, Oriented to Situation Alcohol / Substance use:  Not Applicable Psych involvement (Current and /or in the community):  No (Comment)  Discharge Needs  Concerns to be addressed:  Discharge Planning Concerns Readmission within the last 30 days:  No Current discharge risk:  None Barriers to Discharge:  No Barriers Identified   Renard HamperLecretia Williamson, Student-Social Work 06/06/2016, 2:23 PM

## 2016-06-06 NOTE — Progress Notes (Signed)
Patient ID: Tracy BlaseCheryl C Savage, female   DOB: 27-Jan-1956, 61 y.o.   MRN: [id number] Vital signs are stable. Complains of significant back pain. Poor pain control. Patient states that she can take morphine IV We'll add morphine IV to regimen Mobilize patient as tolerated today

## 2016-06-06 NOTE — Evaluation (Signed)
Occupational Therapy Evaluation Patient Details Name: [name] MRN: [id number] DOB: 08/24/1955 Today's Date: 06/06/2016    History of Present Illness Patient is a 61 y/o female admitted with Lumbar spondylolisthesis L4-5 and L5 L6 with lumbar radiculopathy. History of previous decompression L5 L6.  Now s/p Lumbar laminectomy L4-5 and L5-S1 with posterior lumbar interbody arthrodesis L4-5 and L5-L6 segmental fixation L4 to L6   Clinical Impression   PTA, pt was independent with ADL and functional mobility. She currently requires mod assist for LB ADL and min assist for toilet transfers. Pt would benefit from continued OT services while admitted to improve independence with ADL and functional mobility. Recommend ST SNF placement for continued rehabilitation in order to maximize independence with ADL and return to PLOF. OT will continue to follow acutely.    Follow Up Recommendations  SNF;Supervision/Assistance - 24 hour    Equipment Recommendations  Other (comment) (TBD at next venue of care)    Recommendations for Other Services       Precautions / Restrictions Precautions Precautions: Fall;Back Precaution Booklet Issued: Yes (comment) Precaution Comments: Reviewed handout provided by PT. Required Braces or Orthoses: Spinal Brace Spinal Brace: Lumbar corset;Applied in sitting position Restrictions Weight Bearing Restrictions: No      Mobility Bed Mobility Overal bed mobility: Needs Assistance Bed Mobility: Rolling;Sidelying to Sit Rolling: Supervision Sidelying to sit: Min assist       General bed mobility comments: Min assist due to pain  Transfers Overall transfer level: Needs assistance Equipment used: Rolling walker (2 wheeled) Transfers: Sit to/from Stand Sit to Stand: Supervision         General transfer comment: cues for hand placement    Balance Overall balance assessment: Needs assistance Sitting-balance support: No upper extremity  supported;Feet supported Sitting balance-Leahy Scale: Good     Standing balance support: Bilateral upper extremity supported;No upper extremity supported;During functional activity Standing balance-Leahy Scale: Fair Standing balance comment: Able to stand statically at sink to brush teeth without UE support                            ADL Overall ADL's : Needs assistance/impaired     Grooming: Min guard;Standing   Upper Body Bathing: Supervision/ safety;Sitting   Lower Body Bathing: Moderate assistance;Sit to/from stand   Upper Body Dressing : Supervision/safety;Sitting   Lower Body Dressing: Moderate assistance;Sit to/from stand   Toilet Transfer: Minimal assistance;BSC;RW;Ambulation   Toileting- Clothing Manipulation and Hygiene: Moderate assistance;Sit to/from stand Toileting - Clothing Manipulation Details (indicate cue type and reason): Mod assist to adhere to back precautions     Functional mobility during ADLs: Minimal assistance;Rolling walker General ADL Comments: Educated pt on compensatory ADL strategies and use of AE for toileting hygeine and LB ADL.     Vision Vision Assessment?: No apparent visual deficits   Perception     Praxis      Pertinent Vitals/Pain Pain Assessment: 0-10 Pain Score: 9  Pain Location: Bilateral prox ant thighs Pain Descriptors / Indicators: Discomfort;Aching Pain Intervention(s): Monitored during session;Repositioned;Ice applied     Hand Dominance Right   Extremity/Trunk Assessment Upper Extremity Assessment Upper Extremity Assessment: Generalized weakness   Lower Extremity Assessment Lower Extremity Assessment: Generalized weakness       Communication Communication Communication: No difficulties   Cognition Arousal/Alertness: Awake/alert Behavior During Therapy: WFL for tasks assessed/performed Overall Cognitive Status: Within Functional Limits for tasks assessed  General  Comments       Exercises       Shoulder Instructions      Home Living Family/patient expects to be discharged to:: Skilled nursing facility Living Arrangements:  (granddaughters: one 3y.o. the  other goes to school and work)                                      Prior Functioning/Environment Level of Independence: Independent                 OT Problem List: Decreased strength;Decreased activity tolerance;Impaired balance (sitting and/or standing);Decreased safety awareness;Decreased knowledge of use of DME or AE;Decreased knowledge of precautions;Pain   OT Treatment/Interventions: Self-care/ADL training;Therapeutic exercise;Energy conservation;DME and/or AE instruction;Therapeutic activities;Visual/perceptual remediation/compensation;Patient/family education;Balance training    OT Goals(Current goals can be found in the care plan section) Acute Rehab OT Goals Patient Stated Goal: To go to rehab OT Goal Formulation: With patient Time For Goal Achievement: 06/13/16 Potential to Achieve Goals: Good ADL Goals Pt Will Perform Lower Body Bathing: with modified independence;with adaptive equipment;sit to/from stand Pt Will Perform Lower Body Dressing: with modified independence;with adaptive equipment;sit to/from stand Pt Will Transfer to Toilet: with modified independence;ambulating;bedside commode (BSC over toilet) Additional ADL Goal #1: Pt will complete bed mobility with modified independence with log roll technique in preparation for ADL.  OT Frequency: Min 2X/week   Barriers to D/C:            Co-evaluation              End of Session Equipment Utilized During Treatment: Gait belt;Rolling walker;Back brace  Activity Tolerance: Patient tolerated treatment well Patient left: in bed;with call bell/phone within reach;with bed alarm set   Time: 1610-9604 OT Time Calculation (min): 29 min Charges:  OT General Charges $OT Visit: 1 Procedure OT  Evaluation $OT Eval Moderate Complexity: 1 Procedure OT Treatments $Self Care/Home Management : 8-22 mins G-Codes:    Doristine Section, OTR/L [phone] 06/06/2016, 2:28 PM

## 2016-06-07 LAB — URINALYSIS, ROUTINE W REFLEX MICROSCOPIC
Bilirubin Urine: NEGATIVE
GLUCOSE, UA: NEGATIVE mg/dL
HGB URINE DIPSTICK: NEGATIVE
Ketones, ur: NEGATIVE mg/dL
Leukocytes, UA: NEGATIVE
Nitrite: NEGATIVE
PH: 8 (ref 5.0–8.0)
PROTEIN: NEGATIVE mg/dL
Specific Gravity, Urine: 1.008 (ref 1.005–1.030)

## 2016-06-07 MED ORDER — SULFAMETHOXAZOLE-TRIMETHOPRIM 400-80 MG PO TABS: 1.0000 | ORAL_TABLET | Freq: Two times a day (BID) | ORAL | Status: DC

## 2016-06-07 MED ORDER — FLUCONAZOLE 100 MG PO TABS
100.0000 mg | ORAL_TABLET | Freq: Every day | ORAL | Status: DC
  Administered 2016-06-07 – 2016-06-09 (×3): 100 mg via ORAL
  Filled 2016-06-07 (×3): qty 1

## 2016-06-07 MED ORDER — SULFAMETHOXAZOLE-TRIMETHOPRIM 800-160 MG PO TABS
1.0000 | ORAL_TABLET | Freq: Two times a day (BID) | ORAL | Status: DC
  Administered 2016-06-07: 1 via ORAL
  Filled 2016-06-07 (×3): qty 1

## 2016-06-07 NOTE — Progress Notes (Signed)
Patient ID: Tracy BlaseCheryl C Savage, female   DOB: 1955-07-07, 61 y.o.   MRN: [id number] Fever to 103 Generally achy but now feels she may have UTI. Possibly also yeasty infection. Dressing clean and dry  Will wean iv mso4 in am. Check UA and start antibiotics.

## 2016-06-07 NOTE — Progress Notes (Signed)
Physical Therapy Treatment Patient Details Name: [name] MRN: [id number] DOB: 1955-09-29 Today's Date: 06/07/2016    History of Present Illness Patient is a 61 y/o female admitted with Lumbar spondylolisthesis L4-5 and L5 L6 with lumbar radiculopathy. History of previous decompression L5 L6.  Now s/p Lumbar laminectomy L4-5 and L5-S1 with posterior lumbar interbody arthrodesis L4-5 and L5-L6 segmental fixation L4 to L6    PT Comments    Patient limited more by pain this session due to IV out and unable to get medications via IV.  Continued skilled PT in the acute setting needed prior to d/c to SNF level rehab.   Follow Up Recommendations  SNF     Equipment Recommendations  Rolling walker with 5" wheels    Recommendations for Other Services       Precautions / Restrictions Precautions Precautions: Fall;Back Required Braces or Orthoses: Spinal Brace Spinal Brace: Lumbar corset;Applied in sitting position    Mobility  Bed Mobility Overal bed mobility: Needs Assistance   Rolling: Supervision Sidelying to sit: Min guard       General bed mobility comments: increased time, assist due to pain  Transfers Overall transfer level: Needs assistance Equipment used: Rolling walker (2 wheeled) Transfers: Sit to/from Stand Sit to Stand: Supervision         General transfer comment: for safety, pulls up on walker  Ambulation/Gait Ambulation/Gait assistance: Min guard Ambulation Distance (Feet): 130 Feet Assistive device: Rolling walker (2 wheeled) Gait Pattern/deviations: Step-through pattern;Step-to pattern;Decreased stride length     General Gait Details: feeling as if R leg may give away due to pain, but able to make it back to room   Stairs            Wheelchair Mobility    Modified Rankin (Stroke Patients Only)       Balance Overall balance assessment: Needs assistance Sitting-balance support: No upper extremity supported;Feet  supported Sitting balance-Leahy Scale: Good     Standing balance support: Bilateral upper extremity supported Standing balance-Leahy Scale: Fair Standing balance comment: can stand without UE support for short term for doffing brace                    Cognition Arousal/Alertness: Awake/alert Behavior During Therapy: WFL for tasks assessed/performed Overall Cognitive Status: Within Functional Limits for tasks assessed                      Exercises      General Comments        Pertinent Vitals/Pain Pain Score: 5  Pain Location: R ant shin and knee cap Pain Descriptors / Indicators: Burning;Aching Pain Intervention(s): Monitored during session;Repositioned    Home Living                      Prior Function            PT Goals (current goals can now be found in the care plan section) Progress towards PT goals: Progressing toward goals    Frequency    Min 5X/week      PT Plan Current plan remains appropriate    Co-evaluation             End of Session Equipment Utilized During Treatment: Back brace Activity Tolerance: Patient limited by fatigue;Patient limited by pain Patient left: in bed;with call bell/phone within reach;with nursing/sitter in room     Time: 7829-5621 PT Time Calculation (min) (ACUTE ONLY): 23 min  Charges:  $  Gait Training: 8-22 mins $Therapeutic Activity: 8-22 mins                    G Codes:      Elray McgregorCynthia Kale Rondeau 06/07/2016, 11:09 AM Sheran Lawlessyndi Xai Frerking, PT [phone] 06/07/2016

## 2016-06-07 NOTE — Clinical Social Work Note (Signed)
CSW met with pt to provide bed offers. Pt's first choice has not responded to referral, therefore, CSW left a message with admissions coordinator requesting a return phone call. Pt left a message with her daughter about having a second among the bed offers in [facility]. CSW will continue to follow.   Darden Dates, MSW, [place] Social Worker  [phone]

## 2016-06-07 NOTE — Progress Notes (Signed)
Pt is running a fever of 102.9. Pt medicated with Tylenol. MD Notified.

## 2016-06-07 NOTE — Clinical Social Work Note (Signed)
CSW received message from [facility] admission coordinator stating they have no beds available.   CSW met with pt to provide bed offers and inform that [facility], pt's 1st choice, is full. Pt requested CSW to follow up with [facility] and [facility], as they have not yet responded. Pt also asked about copay information for STR. CSW will follow up on these requests and continue to follow.   Oretha Ellis, MSW, [place] Social Worker  [phone]

## 2016-06-07 NOTE — Progress Notes (Signed)
Pharmacy Antibiotic Note  [name] is a 61 y.o. female admitted on 06/05/2016 for spinal surgery. Pt developed fever since last night. Pharmacy has been consulted for fluconazole dosing for yeast infection. Noted also started on bactrim, UA pending.  Tm 101.5, wbc 17.6 on 1/29  Plan: Fluconazole 100 mg daily F/u fever curve, cultures if available.   Height: 5' 2.5" (158.8 cm) Weight: 189 lb 14 oz (86.1 kg) IBW/kg (Calculated) : 51.25  Temp (24hrs), Avg:101.3 F (38.5 C), Min:99.5 F (37.5 C), Max:103.2 F (39.6 C)   Recent Labs Lab 06/05/16 1827  WBC 17.6*  CREATININE 0.96    Estimated Creatinine Clearance: 64.9 mL/min (by C-G formula based on SCr of 0.96 mg/dL).    Allergies  Allergen Reactions  . Amoxicillin Rash and Other (See Comments)    REACTION: Stomach bleeding  . Ampicillin Rash and Other (See Comments)    REACTION: Stomach bleeding  . Aspirin Other (See Comments)    REACTION: ulcer and GI bleeding. Patient states she can take the 81mg  aspirin-NO 325MG   . Chlorzoxazone Rash and Other (See Comments)    Caused pleural effusion/excessive fluid buildup  . Ciprofloxacin Anaphylaxis    REACTION: Throat swells shut  . Heparin Other (See Comments)    MAKES HEART STOP BEATING.   . Indomethacin Other (See Comments)    MAKES HEART STOP BEATING.   . Nsaids Other (See Comments)    REACTION: Stomach bleeding  . Albumin (Human) Rash and Other (See Comments)    Unknown other reactions  . Cephalexin Hives, Itching and Rash  . Chlorpromazine Rash and Other (See Comments)    hyperactivity  . Darvocet [Propoxyphene N-Acetaminophen] Itching, Nausea And Vomiting and Rash  . Dilaudid [Hydromorphone Hcl] Itching, Nausea And Vomiting and Swelling  . Fentanyl Hives and Nausea And Vomiting  . Hydrocodone Nausea And Vomiting  . Ketorolac Tromethamine Itching, Nausea And Vomiting and Rash  . Nortriptyline Hcl Itching, Rash and Other (See Comments)    REACTION: Hyperactive   . Oxycodone Nausea And Vomiting  . Pentazocine Nausea And Vomiting and Rash  . Pentazocine-Naloxone Nausea And Vomiting  . Percocet [Oxycodone-Acetaminophen] Itching, Nausea And Vomiting and Rash  . Propoxyphene Itching, Nausea And Vomiting and Rash  . Sertraline Hcl Other (See Comments)    REACTION: Hyperactive - bounce off the wall  . Tramadol Hcl Nausea And Vomiting  . Codeine Rash  . Hyoscyamine Sulfate Other (See Comments)  . Imipramine Itching and Rash  . Tramadol Rash    Antimicrobials this admission: Bactrim 1/31 >>  Fluconazole 1/31 >>   Dose adjustments this admission:   Microbiology results: 1/23 MRSA PCR: neg  Thank you for allowing pharmacy to be a part of this patient's care.  Bayard HuggerMei Dorthey Depace, PharmD, BCPS  Clinical Pharmacist  Pager: [phone]   06/07/2016 9:00 PM

## 2016-06-08 MED ORDER — DEXAMETHASONE 1 MG PO TABS: ORAL_TABLET | ORAL | 0 refills | Status: DC

## 2016-06-08 MED ORDER — MEPERIDINE HCL 50 MG PO TABS: 50.0000 mg | ORAL_TABLET | ORAL | 0 refills | Status: DC | PRN

## 2016-06-08 MED ORDER — METHOCARBAMOL 500 MG PO TABS: 500.0000 mg | ORAL_TABLET | Freq: Four times a day (QID) | ORAL | 3 refills | Status: DC | PRN

## 2016-06-08 NOTE — Progress Notes (Signed)
Physical Therapy Treatment Patient Details Name: Tracy BlaseCheryl C Savage MRN: [id number] DOB: [birth year] Today's Date: 06/08/2016    History of Present Illness Patient is a 61 y/o female admitted with Lumbar spondylolisthesis L4-5 and L5 L6 with lumbar radiculopathy. History of previous decompression L5 L6.  Now s/p Lumbar laminectomy L4-5 and L5-S1 with posterior lumbar interbody arthrodesis L4-5 and L5-L6 segmental fixation L4 to L6    PT Comments    Patient progressing to walking without walker and noted prior to PT session was walking with nurse tech in hallway.  Also able to practice stairs this session. Feel though she is concerned about overdoing it at home, she is functioning at too high level for SNF rehab at this time and should be able to go home with intermittent assist and HHPT and  HH aide as well.  PT to follow acutely.   Follow Up Recommendations  Home health PT     Equipment Recommendations  Rolling walker with 5" wheels    Recommendations for Other Services       Precautions / Restrictions Precautions Precautions: Fall;Back Required Braces or Orthoses: Spinal Brace Spinal Brace: Lumbar corset;Applied in sitting position    Mobility  Bed Mobility Overal bed mobility: Needs Assistance Bed Mobility: Rolling;Sidelying to Sit Rolling: Supervision Sidelying to sit: Min guard          Transfers Overall transfer level: Needs assistance Equipment used: Rolling walker (2 wheeled) Transfers: Sit to/from Stand Sit to Stand: Supervision         General transfer comment: for safety occasional cues for precautions  Ambulation/Gait Ambulation/Gait assistance: Supervision Ambulation Distance (Feet): 300 Feet Assistive device: None Gait Pattern/deviations: Step-through pattern     General Gait Details: occasional antalgia on R, cues for precautions with turning to respond to questions during conversation   Stairs Stairs: Yes   Stair Management: One rail  Right;Step to pattern;Forwards Number of Stairs: 12 General stair comments: c/o increased back pain with descending stairs  Wheelchair Mobility    Modified Rankin (Stroke Patients Only)       Balance Overall balance assessment: Needs assistance Sitting-balance support: No upper extremity supported;Feet supported Sitting balance-Leahy Scale: Good     Standing balance support: No upper extremity supported Standing balance-Leahy Scale: Good Standing balance comment: able to move over BOS no device no LOB                    Cognition Arousal/Alertness: Awake/alert Behavior During Therapy: WFL for tasks assessed/performed Overall Cognitive Status: Within Functional Limits for tasks assessed                      Exercises      General Comments General comments (skin integrity, edema, etc.): don/doff brace independent, reports she continues to have difficulty with perineal hygiene and feels she will likely overdo it at home since she wants to take care of her grandaughter and is usually a "hyper" person      Pertinent Vitals/Pain Pain Location: R ant shin and knee cap Pain Descriptors / Indicators: Burning;Aching Pain Intervention(s): Monitored during session    Home Living                      Prior Function            PT Goals (current goals can now be found in the care plan section) Progress towards PT goals: Progressing toward goals    Frequency  Min 5X/week      PT Plan Discharge plan needs to be updated    Co-evaluation             End of Session Equipment Utilized During Treatment: Back brace Activity Tolerance: Patient tolerated treatment well Patient left: in bed     Time: 4098-1191 PT Time Calculation (min) (ACUTE ONLY): 27 min  Charges:  $Gait Training: 8-22 mins $Therapeutic Activity: 8-22 mins                    G Codes:      Elray Mcgregor June 15, 2016, 4:53 PM  Sheran Lawless, PT [phone] 06-15-2016

## 2016-06-08 NOTE — Progress Notes (Signed)
Pharmacy Antibiotic Note  Tracy BlaseCheryl C Savage is a 61 y.o. female admitted on 06/05/2016 for spinal surgery. Pt developed fever since last night. Pharmacy has been consulted for fluconazole dosing for yeast infection. Noted also started on bactrim, UA pending. Tmax is 102.9 and WBC is elevated at 17.6 as of 1/29.   Plan: Continue fluconazole 100mg  PO daily *Pharmacy will sign off as no dose adjustments are anticipated. Thank you for the consult!  Height: 5' 2.5" (158.8 cm) Weight: 189 lb 14 oz (86.1 kg) IBW/kg (Calculated) : 51.25  Temp (24hrs), Avg:100.1 F (37.8 C), Min:98.6 F (37 C), Max:102.9 F (39.4 C)   Recent Labs Lab 06/05/16 1827  WBC 17.6*  CREATININE 0.96    Estimated Creatinine Clearance: 64.9 mL/min (by C-G formula based on SCr of 0.96 mg/dL).    Allergies  Allergen Reactions  . Amoxicillin Rash and Other (See Comments)    REACTION: Stomach bleeding  . Ampicillin Rash and Other (See Comments)    REACTION: Stomach bleeding  . Aspirin Other (See Comments)    REACTION: ulcer and GI bleeding. Patient states she can take the 81mg  aspirin-NO 325MG   . Chlorzoxazone Rash and Other (See Comments)    Caused pleural effusion/excessive fluid buildup  . Ciprofloxacin Anaphylaxis    REACTION: Throat swells shut  . Heparin Other (See Comments)    MAKES HEART STOP BEATING.   . Indomethacin Other (See Comments)    MAKES HEART STOP BEATING.   . Nsaids Other (See Comments)    REACTION: Stomach bleeding  . Albumin (Human) Rash and Other (See Comments)    Unknown other reactions  . Cephalexin Hives, Itching and Rash  . Chlorpromazine Rash and Other (See Comments)    hyperactivity  . Darvocet [Propoxyphene N-Acetaminophen] Itching, Nausea And Vomiting and Rash  . Dilaudid [Hydromorphone Hcl] Itching, Nausea And Vomiting and Swelling  . Fentanyl Hives and Nausea And Vomiting  . Hydrocodone Nausea And Vomiting  . Ketorolac Tromethamine Itching, Nausea And Vomiting and Rash  .  Nortriptyline Hcl Itching, Rash and Other (See Comments)    REACTION: Hyperactive  . Oxycodone Nausea And Vomiting  . Pentazocine Nausea And Vomiting and Rash  . Pentazocine-Naloxone Nausea And Vomiting  . Percocet [Oxycodone-Acetaminophen] Itching, Nausea And Vomiting and Rash  . Propoxyphene Itching, Nausea And Vomiting and Rash  . Sertraline Hcl Other (See Comments)    REACTION: Hyperactive - bounce off the wall  . Tramadol Hcl Nausea And Vomiting  . Codeine Rash  . Hyoscyamine Sulfate Other (See Comments)  . Imipramine Itching and Rash  . Tramadol Rash    Antimicrobials this admission: Bactrim 1/31 >>  Fluconazole 1/31 >>   Dose adjustments this admission:   Microbiology results: 1/23 MRSA PCR: neg  Thank you for allowing pharmacy to be a part of this patient's care.  Lysle Pearlachel [place] Savage, PharmD, BCPS Pager # [phone] 06/08/2016 11:22 AM

## 2016-06-08 NOTE — Discharge Summary (Signed)
Physician Discharge Summary  Patient ID: [name] MRN: [id number] DOB/AGE: 05/23/1955 61 y.o.  Admit date: 06/05/2016 Discharge date: 06/08/2016  Admission Diagnoses:Spondylolisthesis L4-5 and L5 to 6 with lumbar radiculopathy. History of previous surgical decompression L5 L6.  Discharge Diagnoses: Spondylolisthesis L4-5 and L5 L6 with lumbar radiculopathy. History of previous surgical decompression L5 L6. History of multiple allergies. Urinary tract infection. Acute blood loss anemia.good  Active Problems:   Spondylolisthesis at L4-L5 level   Discharged Condition: good  Hospital Course: Patient was admitted to undergo surgical decompression at L4-5 and L5 L6. She tolerated surgery well. She's had a history of multiple medication intolerances including to multiple opioids. Pain management was initially difficult to control. She has significant hemoglobin drop postoperatively of 3 g however she tolerated this well without needing transfusion. She had acute blood loss anemia. She is improving her ambulatory status however because she lives independently she is being placed in a skilled nursing facility to convalesce for short period of time.  Consults: None  Significant Diagnostic Studies: None  Treatments: surgery: Surgical decompression of L4-5 and L5 L6 with posterior lumbar interbody arthrodesis using peek spacers local autograft allograft pedicle fixation L4 to L6.  Discharge Exam: Blood pressure 107/66, pulse 86, temperature 99.9 F (37.7 C), temperature source Oral, resp. rate (!) 21, height 5' 2.5" (1.588 m), weight 86.1 kg (189 lb 14 oz), SpO2 99 %. Incision is clean and dry, motor function is intact. Station and gait are intact  Disposition: Skilled nursing facility  Discharge Instructions    Call MD for:  redness, tenderness, or signs of infection (pain, swelling, redness, odor or green/yellow discharge around incision site)    Complete by:  As directed    Call MD for:   severe uncontrolled pain    Complete by:  As directed    Call MD for:  temperature >100.4    Complete by:  As directed    Diet - low sodium heart healthy    Complete by:  As directed    Incentive spirometry RT    Complete by:  As directed    Increase activity slowly    Complete by:  As directed      Allergies as of 06/08/2016      Reactions   Amoxicillin Rash, Other (See Comments)   REACTION: Stomach bleeding   Ampicillin Rash, Other (See Comments)   REACTION: Stomach bleeding   Aspirin Other (See Comments)   REACTION: ulcer and GI bleeding. Patient states she can take the 81mg  aspirin-NO 325MG    Chlorzoxazone Rash, Other (See Comments)   Caused pleural effusion/excessive fluid buildup   Ciprofloxacin Anaphylaxis   REACTION: Throat swells shut   Heparin Other (See Comments)   MAKES HEART STOP BEATING.    Indomethacin Other (See Comments)   MAKES HEART STOP BEATING.    Nsaids Other (See Comments)   REACTION: Stomach bleeding   Albumin (human) Rash, Other (See Comments)   Unknown other reactions   Cephalexin Hives, Itching, Rash   Chlorpromazine Rash, Other (See Comments)   hyperactivity   Darvocet [propoxyphene N-acetaminophen] Itching, Nausea And Vomiting, Rash   Dilaudid [hydromorphone Hcl] Itching, Nausea And Vomiting, Swelling   Fentanyl Hives, Nausea And Vomiting   Hydrocodone Nausea And Vomiting   Ketorolac Tromethamine Itching, Nausea And Vomiting, Rash   Nortriptyline Hcl Itching, Rash, Other (See Comments)   REACTION: Hyperactive   Oxycodone Nausea And Vomiting   Pentazocine Nausea And Vomiting, Rash   Pentazocine-naloxone Nausea And Vomiting  Percocet [oxycodone-acetaminophen] Itching, Nausea And Vomiting, Rash   Propoxyphene Itching, Nausea And Vomiting, Rash   Sertraline Hcl Other (See Comments)   REACTION: Hyperactive - bounce off the wall   Tramadol Hcl Nausea And Vomiting   Codeine Rash   Hyoscyamine Sulfate Other (See Comments)   Imipramine Itching,  Rash   Tramadol Rash      Medication List    TAKE these medications   albuterol 108 (90 Base) MCG/ACT inhaler Commonly known as:  PROVENTIL HFA;VENTOLIN HFA Inhale 2 puffs into the lungs every 6 (six) hours as needed for wheezing.   aspirin EC 81 MG tablet Take 81 mg by mouth every evening.   cetirizine 10 MG tablet Commonly known as:  ZYRTEC Take 10 mg by mouth 2 (two) times daily.   cyclobenzaprine 10 MG tablet Commonly known as:  FLEXERIL Take 1 tablet (10 mg total) by mouth 2 (two) times daily as needed for muscle spasms.   dexamethasone 1 MG tablet Commonly known as:  DECADRON 2 tablets twice daily for 2 days, one tablet twice daily for 2 days, one tablet daily for 2 days.   FLUoxetine 20 MG capsule Commonly known as:  PROZAC Take 60 mg by mouth daily.   gabapentin 300 MG capsule Commonly known as:  NEURONTIN Take 900-1,800 mg by mouth 3 (three) times daily. Takes 3 capsules in the morning, 3 capsules in the afternoon, and 6 capsules at bedtime.   hydrOXYzine 25 MG capsule Commonly known as:  VISTARIL Take 1 capsule by mouth 3 (three) times daily as needed for anxiety.   ibuprofen 800 MG tablet Commonly known as:  ADVIL,MOTRIN Take 800 mg by mouth every 8 (eight) hours as needed for headache or moderate pain.   levothyroxine 25 MCG tablet Commonly known as:  SYNTHROID, LEVOTHROID Take 25 mcg by mouth daily before breakfast.   LORazepam 1 MG tablet Commonly known as:  ATIVAN Take 1 mg by mouth 4 (four) times daily as needed for anxiety.   meloxicam 15 MG tablet Commonly known as:  MOBIC Take 15 mg by mouth at bedtime.   meperidine 50 MG tablet Commonly known as:  DEMEROL Take 1 tablet (50 mg total) by mouth every 6 (six) hours as needed for moderate pain. FOR PAIN What changed:  Another medication with the same name was added. Make sure you understand how and when to take each.   meperidine 50 MG tablet Commonly known as:  DEMEROL Take 1 tablet (50 mg  total) by mouth every 4 (four) hours as needed for severe pain. What changed:  You were already taking a medication with the same name, and this prescription was added. Make sure you understand how and when to take each.   methocarbamol 500 MG tablet Commonly known as:  ROBAXIN Take 1 tablet (500 mg total) by mouth every 6 (six) hours as needed for muscle spasms.   metoprolol succinate 25 MG 24 hr tablet Commonly known as:  TOPROL-XL Take 25 mg by mouth every morning.   omeprazole 20 MG capsule Commonly known as:  PRILOSEC Take 1 capsule (20 mg total) by mouth 2 (two) times daily.   predniSONE 5 MG tablet Commonly known as:  DELTASONE Take 5 mg by mouth See admin instructions. Take 1 tablet by mouth once daily as needed for rash on breast area.   rOPINIRole 0.5 MG tablet Commonly known as:  REQUIP Take 0.5 mg by mouth at bedtime. Take with 2 mg tablet to equal 2.5 mg.  rOPINIRole 2 MG tablet Commonly known as:  REQUIP Take 2 mg by mouth at bedtime. Take with 0.5 mg to equal 2.5 mg.   triamcinolone ointment 0.1 % Commonly known as:  KENALOG Apply 1 application topically 2 (two) times daily as needed (Apply as needed to right breast for irritation).        SignedStefani Dama 06/08/2016, 8:55 PM

## 2016-06-08 NOTE — Progress Notes (Signed)
Patient ID: [name], female   DOB: 1955/11/17, 61 y.o.   MRN: [id number] Vital signs are stable No further fever Patient is on treatment for urinary tract infection She is clinically doing quite well Plan is to discharge her tomorrow.

## 2016-06-09 LAB — INFLUENZA PANEL BY PCR (TYPE A & B)
INFLAPCR: NEGATIVE
Influenza B By PCR: NEGATIVE

## 2016-06-09 NOTE — Clinical Social Work Note (Signed)
Pt is ready for d/c today. P/T is recommending home health, and pt no longer qualifies for SNF. RNCM is following for D/C planning. CSW signing off, as no further needs identified.   Corlis HoveJeneya Alasdair Kleve, MSW, [facility]WA Clinical Social Worker [phone]

## 2016-06-09 NOTE — Care Management Important Message (Signed)
Important Message  Patient Details  Name: [name] MRN: [id number] Date of Birth: May 18, 1955   Medicare Important Message Given:  Yes    Dorena BodoIris Ebin Palazzi 06/09/2016, 11:52 AM

## 2016-06-09 NOTE — Progress Notes (Signed)
Physical Therapy Treatment Patient Details Name: [name] MRN: [id number] DOB: [birth year] Today's Date: 06/09/2016    History of Present Illness Patient is a 61 y/o female admitted with Lumbar spondylolisthesis L4-5 and L5 L6 with lumbar radiculopathy. History of previous decompression L5 L6.  Now s/p Lumbar laminectomy L4-5 and L5-S1 with posterior lumbar interbody arthrodesis L4-5 and L5-L6 segmental fixation L4 to L6    PT Comments    Patient stable for d/c and met all PT goals (up ambulating in hall independent with walker.)  Feel she has knowledge to avoid overdoing it, and additional HHPT and aide may improve safety.  No further skilled PT needs at this time and planned d/c today.  Will sign off.   Follow Up Recommendations  Home health PT     Equipment Recommendations  Rolling walker with 5" wheels    Recommendations for Other Services       Precautions / Restrictions Precautions Precautions: Back Required Braces or Orthoses: Spinal Brace Spinal Brace: Lumbar corset;Applied in sitting position    Mobility  Bed Mobility               General bed mobility comments: NT  Transfers                 General transfer comment: NT  Ambulation/Gait             General Gait Details: Patient up walking halls independent with walker, spoke to pt about needs for education prior to d/c then in room pt in bed and educated on car transfers, managing activity to avoid overdoing it, need for assist for certain things and importance of follow through on precautions for proper healing.    Stairs            Wheelchair Mobility    Modified Rankin (Stroke Patients Only)       Balance                                    Cognition Arousal/Alertness: Awake/alert Behavior During Therapy: WFL for tasks assessed/performed Overall Cognitive Status: Within Functional Limits for tasks assessed                      Exercises       General Comments        Pertinent Vitals/Pain Pain Assessment: Faces Faces Pain Scale: Hurts even more Pain Location: back when in bed Pain Descriptors / Indicators: Aching Pain Intervention(s): Monitored during session    Home Living                      Prior Function            PT Goals (current goals can now be found in the care plan section) Progress towards PT goals: Goals met/education completed, patient discharged from PT    Frequency    Min 5X/week      PT Plan Current plan remains appropriate    Co-evaluation             End of Session Equipment Utilized During Treatment: Back brace Activity Tolerance: Patient tolerated treatment well Patient left: in bed     Time: 1551-1610 PT Time Calculation (min) (ACUTE ONLY): 19 min  Charges:  $Self Care/Home Management: 8-22  G Codes:      Reginia Naas 06/09/2016, 5:13 PM Magda Kiel, [place] 06/09/2016

## 2016-06-09 NOTE — Care Management Note (Signed)
Case Management Note  Patient Details  Name: [name] MRN: [id number] Date of Birth: Sep 15, 1955  Subjective/Objective:                    Action/Plan: [facility] Pt was able ambulated 300 feet yesterday and thus no longer qualifies for SNF. CM and CSW spoke to patient and she is happy with going home. CM spoke to Dr Danielle DessElsner and informed him of her ambulation and not qualifying for SNF. He is good with patient discharging home. [facility].  Orders placed and CM spoke to the patient and [facility]. Pt also with orders for walker and 3 in1. [facility] and delivered the equipment to the room.  Pt states she has transportation home.   Expected Discharge Date:  06/09/16               Expected Discharge Plan:  Skilled Nursing Facility  In-House Referral:     Discharge planning Services  CM Consult  Post Acute Care Choice:  Home Health, Durable Medical Equipment Choice offered to:  Patient  DME Arranged:  3-N-1, Walker rolling DME Agency:  [facility]  [facility]  [facility]  Status of Service:  Completed, signed off  If discussed at Long Length of Stay Meetings, dates discussed:    Additional Comments:  Kermit BaloKelli F Denaly Gatling, RN 06/09/2016, 1:41 PM

## 2016-06-09 NOTE — Progress Notes (Signed)
Pt discharged home. Dressing removed per MD order. PT was medicated before leaving. Discharged instructions were reviewed with pt. PT verbalized understanding.

## 2016-06-17 ENCOUNTER — Emergency Department (HOSPITAL_COMMUNITY): Payer: Medicare Other

## 2016-06-17 ENCOUNTER — Encounter (HOSPITAL_COMMUNITY): Payer: Self-pay | Admitting: Nurse Practitioner

## 2016-06-17 ENCOUNTER — Emergency Department (HOSPITAL_COMMUNITY)
Admission: EM | Admit: 2016-06-17 | Discharge: 2016-06-17 | Disposition: A | Discharge status: Home / Self Care | Payer: Medicare Other | Attending: Emergency Medicine | Admitting: Emergency Medicine

## 2016-06-17 DIAGNOSIS — J45909 Unspecified asthma, uncomplicated: Secondary | ICD-10-CM | POA: Insufficient documentation

## 2016-06-17 DIAGNOSIS — Y999 Unspecified external cause status: Secondary | ICD-10-CM | POA: Insufficient documentation

## 2016-06-17 DIAGNOSIS — S39012A Strain of muscle, fascia and tendon of lower back, initial encounter: Secondary | ICD-10-CM | POA: Insufficient documentation

## 2016-06-17 DIAGNOSIS — Y939 Activity, unspecified: Secondary | ICD-10-CM | POA: Insufficient documentation

## 2016-06-17 DIAGNOSIS — Z7982 Long term (current) use of aspirin: Secondary | ICD-10-CM | POA: Diagnosis not present

## 2016-06-17 DIAGNOSIS — Y92410 Unspecified street and highway as the place of occurrence of the external cause: Secondary | ICD-10-CM | POA: Diagnosis not present

## 2016-06-17 DIAGNOSIS — E039 Hypothyroidism, unspecified: Secondary | ICD-10-CM | POA: Insufficient documentation

## 2016-06-17 DIAGNOSIS — S3992XA Unspecified injury of lower back, initial encounter: Secondary | ICD-10-CM | POA: Diagnosis present

## 2016-06-17 DIAGNOSIS — Z8673 Personal history of transient ischemic attack (TIA), and cerebral infarction without residual deficits: Secondary | ICD-10-CM | POA: Diagnosis not present

## 2016-06-17 MED ORDER — ONDANSETRON 4 MG PO TBDP
4.0000 mg | ORAL_TABLET | Freq: Once | ORAL | Status: AC
  Administered 2016-06-17: 4 mg via ORAL
  Filled 2016-06-17: qty 1

## 2016-06-17 MED ORDER — MORPHINE SULFATE (PF) 4 MG/ML IV SOLN
4.0000 mg | Freq: Once | INTRAVENOUS | Status: AC
  Administered 2016-06-17: 4 mg via INTRAMUSCULAR
  Filled 2016-06-17: qty 1

## 2016-06-17 NOTE — ED Notes (Signed)
Pt to xray
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
## 2016-09-01 ENCOUNTER — Other Ambulatory Visit: Payer: Self-pay | Admitting: Neurological Surgery

## 2016-09-01 DIAGNOSIS — G959 Disease of spinal cord, unspecified: Secondary | ICD-10-CM

## 2016-09-01 DIAGNOSIS — M5412 Radiculopathy, cervical region: Secondary | ICD-10-CM

## 2016-09-01 DIAGNOSIS — M4712 Other spondylosis with myelopathy, cervical region: Secondary | ICD-10-CM

## 2016-09-17 ENCOUNTER — Ambulatory Visit
Admission: RE | Admit: 2016-09-17 | Discharge: 2016-09-17 | Disposition: A | Discharge status: Home / Self Care | Payer: Medicare Other | Source: Ambulatory Visit | Attending: Neurological Surgery | Admitting: Neurological Surgery

## 2016-09-17 DIAGNOSIS — G959 Disease of spinal cord, unspecified: Secondary | ICD-10-CM

## 2016-09-17 DIAGNOSIS — M5412 Radiculopathy, cervical region: Secondary | ICD-10-CM

## 2016-09-17 DIAGNOSIS — M4712 Other spondylosis with myelopathy, cervical region: Secondary | ICD-10-CM

## 2016-10-04 ENCOUNTER — Other Ambulatory Visit (HOSPITAL_COMMUNITY): Payer: Self-pay | Admitting: Neurological Surgery

## 2016-10-04 ENCOUNTER — Other Ambulatory Visit: Payer: Self-pay | Admitting: Neurological Surgery

## 2016-10-04 DIAGNOSIS — M5416 Radiculopathy, lumbar region: Secondary | ICD-10-CM

## 2016-10-04 DIAGNOSIS — M5412 Radiculopathy, cervical region: Secondary | ICD-10-CM

## 2016-10-04 DIAGNOSIS — G959 Disease of spinal cord, unspecified: Secondary | ICD-10-CM

## 2016-10-04 DIAGNOSIS — M4712 Other spondylosis with myelopathy, cervical region: Secondary | ICD-10-CM

## 2016-10-13 NOTE — Addendum Note (Signed)
Addendum  created 10/13/16 0931 by Maridee Slape, MD   Sign clinical note    

## 2016-10-17 ENCOUNTER — Ambulatory Visit (HOSPITAL_COMMUNITY)
Admission: RE | Admit: 2016-10-17 | Discharge: 2016-10-17 | Disposition: A | Discharge status: Home / Self Care | Payer: Medicare Other | Source: Ambulatory Visit | Attending: Neurological Surgery | Admitting: Neurological Surgery

## 2016-10-17 ENCOUNTER — Ambulatory Visit (HOSPITAL_COMMUNITY): Payer: Medicare Other

## 2016-11-16 ENCOUNTER — Ambulatory Visit (HOSPITAL_COMMUNITY)
Admission: RE | Admit: 2016-11-16 | Discharge: 2016-11-16 | Disposition: A | Discharge status: Home / Self Care | Payer: Medicare Other | Source: Ambulatory Visit | Attending: Neurological Surgery | Admitting: Neurological Surgery

## 2016-11-16 ENCOUNTER — Inpatient Hospital Stay (HOSPITAL_COMMUNITY): Admission: RE | Admit: 2016-11-16 | Payer: Medicare Other | Source: Ambulatory Visit

## 2016-11-16 DIAGNOSIS — M47893 Other spondylosis, cervicothoracic region: Secondary | ICD-10-CM | POA: Insufficient documentation

## 2016-11-16 DIAGNOSIS — G959 Disease of spinal cord, unspecified: Secondary | ICD-10-CM

## 2016-11-16 DIAGNOSIS — M47896 Other spondylosis, lumbar region: Secondary | ICD-10-CM | POA: Diagnosis not present

## 2016-11-16 DIAGNOSIS — M5412 Radiculopathy, cervical region: Secondary | ICD-10-CM | POA: Insufficient documentation

## 2016-11-16 DIAGNOSIS — M47894 Other spondylosis, thoracic region: Secondary | ICD-10-CM | POA: Insufficient documentation

## 2016-11-16 DIAGNOSIS — M47892 Other spondylosis, cervical region: Secondary | ICD-10-CM | POA: Diagnosis not present

## 2016-11-16 DIAGNOSIS — M1288 Other specific arthropathies, not elsewhere classified, other specified site: Secondary | ICD-10-CM | POA: Insufficient documentation

## 2016-11-16 DIAGNOSIS — M4712 Other spondylosis with myelopathy, cervical region: Secondary | ICD-10-CM | POA: Diagnosis not present

## 2016-11-16 DIAGNOSIS — G2581 Restless legs syndrome: Secondary | ICD-10-CM | POA: Diagnosis not present

## 2016-11-16 DIAGNOSIS — Q7649 Other congenital malformations of spine, not associated with scoliosis: Secondary | ICD-10-CM | POA: Diagnosis not present

## 2016-11-16 DIAGNOSIS — M2578 Osteophyte, vertebrae: Secondary | ICD-10-CM | POA: Insufficient documentation

## 2016-11-16 DIAGNOSIS — M5416 Radiculopathy, lumbar region: Secondary | ICD-10-CM | POA: Diagnosis not present

## 2016-11-16 MED ORDER — LIDOCAINE HCL (PF) 1 % IJ SOLN
5.0000 mL | Freq: Once | INTRAMUSCULAR | Status: AC
  Administered 2016-11-16: 2 mL via INTRADERMAL

## 2016-11-16 MED ORDER — ONDANSETRON HCL 4 MG/2ML IJ SOLN: 4.0000 mg | Freq: Four times a day (QID) | INTRAMUSCULAR | Status: DC | PRN

## 2016-11-16 MED ORDER — MEPERIDINE HCL 50 MG PO TABS
50.0000 mg | ORAL_TABLET | Freq: Once | ORAL | Status: AC
  Administered 2016-11-16: 50 mg via ORAL
  Filled 2016-11-16: qty 1

## 2016-11-16 MED ORDER — DIAZEPAM 5 MG PO TABS
10.0000 mg | ORAL_TABLET | Freq: Once | ORAL | Status: AC
  Administered 2016-11-16: 10 mg via ORAL

## 2016-11-16 MED ORDER — IOPAMIDOL (ISOVUE-M 300) INJECTION 61%
15.0000 mL | Freq: Once | INTRAMUSCULAR | Status: AC | PRN
  Administered 2016-11-16: 11 mL via INTRATHECAL

## 2016-11-16 MED ORDER — DEXAMETHASONE 4 MG PO TABS
4.0000 mg | ORAL_TABLET | Freq: Once | ORAL | Status: AC
  Administered 2016-11-16: 4 mg via ORAL
  Filled 2016-11-16: qty 1

## 2016-11-16 MED ORDER — IOPAMIDOL (ISOVUE-M 300) INJECTION 61%
INTRAMUSCULAR | Status: AC
  Administered 2016-11-16: 11 mL via INTRATHECAL
  Filled 2016-11-16: qty 15

## 2016-11-16 MED ORDER — LIDOCAINE HCL 1 % IJ SOLN
INTRAMUSCULAR | Status: AC
  Filled 2016-11-16: qty 10

## 2016-11-16 MED ORDER — MEPERIDINE HCL 50 MG/5ML PO SOLN: 50.0000 mg | ORAL | Status: DC | PRN

## 2016-11-16 MED ORDER — DIAZEPAM 5 MG PO TABS
ORAL_TABLET | ORAL | Status: AC
  Filled 2016-11-16: qty 2

## 2016-11-16 NOTE — Discharge Instructions (Signed)
Myelogram, Care After °Refer to this sheet in the next few weeks. These instructions provide you with information about caring for yourself after your procedure. Your health care provider may also give you more specific instructions. Your treatment has been planned according to current medical practices, but problems sometimes occur. Call your health care provider if you have any problems or questions after your procedure. °What can I expect after the procedure? °After the procedure, it is common to have: °· Soreness at your injection site. °· A mild headache. ° °Follow these instructions at home: °· Drink enough fluid to keep your urine clear or pale yellow. This will help flush out the dye (contrast material) from your spine. °· Rest as told by your health care provider. Lie flat with your head slightly raised (elevated) to reduce the risk of headache. °· Do not bend, lift, or do any strenuous activity for 24-48 hours or as told by your health care provider. °· Take over-the-counter and prescription medicines only as told by your health care provider. °· Take care of and remove your bandage (dressing) as told by your health care provider. °· Bathe or shower as told by your health care provider. °Contact a health care provider if: °· You have a fever. °· You have a headache that lasts longer than 24 hours. °· You feel nauseous or vomit. °· You have a stiff neck or numbness in your legs. °· You are unable to urinate or have a bowel movement. °· You develop a rash, itching, or sneezing. °Get help right away if: °· You have new symptoms or your symptoms get worse. °· You have a seizure. °· You have trouble breathing. °This information is not intended to replace advice given to you by your health care provider. Make sure you discuss any questions you have with your health care provider. °Document Released: 05/21/2015 Document Revised: 09/30/2015 Document Reviewed: 02/04/2015 °Elsevier Interactive Patient Education ©  2018 [employer] ° °

## 2016-11-16 NOTE — Progress Notes (Addendum)
Pt states that she thinks her BP is elevated because she developed a severe headache. BP 134/81/lower than previous reading. Radiology here to take pt for myelogram. Pt states she will let staff in radiology know.

## 2016-11-16 NOTE — Procedures (Signed)
Mart PiggsCheryl Savage is a 61 year old right-handed individual who's had a chronic cervical radiculopathy. She is also had significant spondylosis of lumbar spine is undergone a decompression and fusion from L3-L5 secondary to spondylolisthesis with stenosis she has restless leg syndrome and was unable to tolerate an MRI despite heavy sedation. A myelogram and post milligrams CAT scan is been suggested to look at her entire neural axis and is now being performed.  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 Post op Dx: Same Procedure: Total myelogram Surgeon: Leena Tiede Puncture level: L2-3 Fluid color: Clear, colorless Injection: Isovue-300, 11 mL Findings: Solid arthrodesis L3-L5 adjacent level spondylosis, mild in the lumbar spine. Cervical fusion. With significant hardware, no overt root cut off. Further evaluation with CT scanning.

## 2016-11-16 NOTE — Progress Notes (Signed)
Pt arrived to me with 10/10 head ache pain, it was reported to me they had given demerol to the pt earlier but had not, called radiology nurses station and spoke with Joanne GavelEileen RN and it was already know that she had pain but the order was placed incorrectly but they would get Dr Danielle DessElsner to rewrite the order and administer. Order was not changed and 15 minutes have been spent trying to locate physician via phone, office[facility]

## 2016-11-22 ENCOUNTER — Encounter (INDEPENDENT_AMBULATORY_CARE_PROVIDER_SITE_OTHER): Payer: Self-pay | Admitting: Internal Medicine

## 2016-12-11 ENCOUNTER — Ambulatory Visit (INDEPENDENT_AMBULATORY_CARE_PROVIDER_SITE_OTHER): Payer: Medicare Other | Admitting: Internal Medicine

## 2017-03-15 ENCOUNTER — Ambulatory Visit (INDEPENDENT_AMBULATORY_CARE_PROVIDER_SITE_OTHER): Payer: Medicare Other | Admitting: Internal Medicine

## 2017-03-15 ENCOUNTER — Encounter (INDEPENDENT_AMBULATORY_CARE_PROVIDER_SITE_OTHER): Payer: Self-pay | Admitting: Internal Medicine

## 2017-06-08 ENCOUNTER — Emergency Department (HOSPITAL_COMMUNITY): Payer: Medicare Other

## 2017-06-08 ENCOUNTER — Inpatient Hospital Stay (HOSPITAL_COMMUNITY)
Admission: EM | Admit: 2017-06-08 | Discharge: 2017-06-09 | DRG: 203 | Disposition: A | Discharge status: Home / Self Care | Payer: Medicare Other | Attending: Internal Medicine | Admitting: Internal Medicine

## 2017-06-08 ENCOUNTER — Encounter (HOSPITAL_COMMUNITY): Payer: Self-pay

## 2017-06-08 DIAGNOSIS — Z7982 Long term (current) use of aspirin: Secondary | ICD-10-CM | POA: Diagnosis not present

## 2017-06-08 DIAGNOSIS — J441 Chronic obstructive pulmonary disease with (acute) exacerbation: Secondary | ICD-10-CM | POA: Diagnosis not present

## 2017-06-08 DIAGNOSIS — Z881 Allergy status to other antibiotic agents status: Secondary | ICD-10-CM | POA: Diagnosis not present

## 2017-06-08 DIAGNOSIS — Z915 Personal history of self-harm: Secondary | ICD-10-CM

## 2017-06-08 DIAGNOSIS — Z880 Allergy status to penicillin: Secondary | ICD-10-CM | POA: Diagnosis not present

## 2017-06-08 DIAGNOSIS — Z79899 Other long term (current) drug therapy: Secondary | ICD-10-CM

## 2017-06-08 DIAGNOSIS — G894 Chronic pain syndrome: Secondary | ICD-10-CM | POA: Diagnosis not present

## 2017-06-08 DIAGNOSIS — Z888 Allergy status to other drugs, medicaments and biological substances status: Secondary | ICD-10-CM

## 2017-06-08 DIAGNOSIS — R0602 Shortness of breath: Secondary | ICD-10-CM | POA: Diagnosis present

## 2017-06-08 DIAGNOSIS — Z803 Family history of malignant neoplasm of breast: Secondary | ICD-10-CM

## 2017-06-08 DIAGNOSIS — Z9049 Acquired absence of other specified parts of digestive tract: Secondary | ICD-10-CM

## 2017-06-08 DIAGNOSIS — F419 Anxiety disorder, unspecified: Secondary | ICD-10-CM | POA: Diagnosis present

## 2017-06-08 DIAGNOSIS — Z8249 Family history of ischemic heart disease and other diseases of the circulatory system: Secondary | ICD-10-CM

## 2017-06-08 DIAGNOSIS — E039 Hypothyroidism, unspecified: Secondary | ICD-10-CM | POA: Diagnosis present

## 2017-06-08 DIAGNOSIS — Z885 Allergy status to narcotic agent status: Secondary | ICD-10-CM

## 2017-06-08 DIAGNOSIS — Z801 Family history of malignant neoplasm of trachea, bronchus and lung: Secondary | ICD-10-CM

## 2017-06-08 DIAGNOSIS — Z886 Allergy status to analgesic agent status: Secondary | ICD-10-CM

## 2017-06-08 DIAGNOSIS — G40909 Epilepsy, unspecified, not intractable, without status epilepticus: Secondary | ICD-10-CM

## 2017-06-08 DIAGNOSIS — Z981 Arthrodesis status: Secondary | ICD-10-CM

## 2017-06-08 DIAGNOSIS — F411 Generalized anxiety disorder: Secondary | ICD-10-CM

## 2017-06-08 DIAGNOSIS — K219 Gastro-esophageal reflux disease without esophagitis: Secondary | ICD-10-CM | POA: Diagnosis present

## 2017-06-08 DIAGNOSIS — R569 Unspecified convulsions: Secondary | ICD-10-CM | POA: Diagnosis present

## 2017-06-08 DIAGNOSIS — F329 Major depressive disorder, single episode, unspecified: Secondary | ICD-10-CM | POA: Diagnosis present

## 2017-06-08 DIAGNOSIS — K589 Irritable bowel syndrome without diarrhea: Secondary | ICD-10-CM | POA: Diagnosis not present

## 2017-06-08 DIAGNOSIS — Z8049 Family history of malignant neoplasm of other genital organs: Secondary | ICD-10-CM

## 2017-06-08 DIAGNOSIS — Z7951 Long term (current) use of inhaled steroids: Secondary | ICD-10-CM

## 2017-06-08 DIAGNOSIS — Z882 Allergy status to sulfonamides status: Secondary | ICD-10-CM

## 2017-06-08 DIAGNOSIS — M797 Fibromyalgia: Secondary | ICD-10-CM | POA: Diagnosis present

## 2017-06-08 DIAGNOSIS — J209 Acute bronchitis, unspecified: Secondary | ICD-10-CM | POA: Diagnosis not present

## 2017-06-08 DIAGNOSIS — M1990 Unspecified osteoarthritis, unspecified site: Secondary | ICD-10-CM | POA: Diagnosis present

## 2017-06-08 DIAGNOSIS — Z79890 Hormone replacement therapy (postmenopausal): Secondary | ICD-10-CM

## 2017-06-08 DIAGNOSIS — Z8673 Personal history of transient ischemic attack (TIA), and cerebral infarction without residual deficits: Secondary | ICD-10-CM

## 2017-06-08 DIAGNOSIS — Z90710 Acquired absence of both cervix and uterus: Secondary | ICD-10-CM

## 2017-06-08 DIAGNOSIS — I341 Nonrheumatic mitral (valve) prolapse: Secondary | ICD-10-CM | POA: Diagnosis present

## 2017-06-08 DIAGNOSIS — Z791 Long term (current) use of non-steroidal anti-inflammatories (NSAID): Secondary | ICD-10-CM

## 2017-06-08 LAB — CBC WITH DIFFERENTIAL/PLATELET
BASOS ABS: 0 10*3/uL (ref 0.0–0.1)
Basophils Relative: 0 %
EOS ABS: 0.1 10*3/uL (ref 0.0–0.7)
EOS PCT: 1 %
HCT: 43.8 % (ref 36.0–46.0)
Hemoglobin: 14 g/dL (ref 12.0–15.0)
LYMPHS ABS: 2.8 10*3/uL (ref 0.7–4.0)
LYMPHS PCT: 28 %
MCH: 28.5 pg (ref 26.0–34.0)
MCHC: 32 g/dL (ref 30.0–36.0)
MCV: 89 fL (ref 78.0–100.0)
MONO ABS: 1 10*3/uL (ref 0.1–1.0)
Monocytes Relative: 11 %
Neutro Abs: 6 10*3/uL (ref 1.7–7.7)
Neutrophils Relative %: 60 %
PLATELETS: 354 10*3/uL (ref 150–400)
RBC: 4.92 MIL/uL (ref 3.87–5.11)
RDW: 14.3 % (ref 11.5–15.5)
WBC: 9.8 10*3/uL (ref 4.0–10.5)

## 2017-06-08 LAB — COMPREHENSIVE METABOLIC PANEL
ALT: 28 U/L (ref 14–54)
AST: 36 U/L (ref 15–41)
Albumin: 4.2 g/dL (ref 3.5–5.0)
Alkaline Phosphatase: 96 U/L (ref 38–126)
Anion gap: 18 — ABNORMAL HIGH (ref 5–15)
BUN: 11 mg/dL (ref 6–20)
CALCIUM: 9.4 mg/dL (ref 8.9–10.3)
CHLORIDE: 98 mmol/L — AB (ref 101–111)
CO2: 20 mmol/L — AB (ref 22–32)
CREATININE: 0.9 mg/dL (ref 0.44–1.00)
GFR calc Af Amer: >60 mL/min (ref >60)
GFR calc non Af Amer: >60 mL/min (ref >60)
GLUCOSE: 115 mg/dL — AB (ref 65–99)
Potassium: 4.1 mmol/L (ref 3.5–5.1)
SODIUM: 136 mmol/L (ref 135–145)
Total Bilirubin: 0.7 mg/dL (ref 0.3–1.2)
Total Protein: 8 g/dL (ref 6.5–8.1)

## 2017-06-08 LAB — BRAIN NATRIURETIC PEPTIDE: B Natriuretic Peptide: 17 pg/mL (ref 0.0–100.0)

## 2017-06-08 LAB — TROPONIN I: Troponin I: <0.03 ng/mL (ref <0.03)

## 2017-06-08 MED ORDER — MELOXICAM 7.5 MG PO TABS
15.0000 mg | ORAL_TABLET | Freq: Every day | ORAL | Status: DC
  Administered 2017-06-09: 15 mg via ORAL
  Filled 2017-06-08: qty 2

## 2017-06-08 MED ORDER — METOPROLOL SUCCINATE ER 50 MG PO TB24
50.0000 mg | ORAL_TABLET | Freq: Every day | ORAL | Status: DC
  Administered 2017-06-09: 50 mg via ORAL
  Filled 2017-06-08: qty 1

## 2017-06-08 MED ORDER — ONDANSETRON HCL 4 MG/2ML IJ SOLN: 4.0000 mg | Freq: Four times a day (QID) | INTRAMUSCULAR | Status: DC | PRN

## 2017-06-08 MED ORDER — METHYLPREDNISOLONE SODIUM SUCC 125 MG IJ SOLR
60.0000 mg | Freq: Two times a day (BID) | INTRAMUSCULAR | Status: DC
  Administered 2017-06-09: 60 mg via INTRAVENOUS
  Filled 2017-06-08: qty 2

## 2017-06-08 MED ORDER — ASPIRIN EC 81 MG PO TBEC: 81.0000 mg | DELAYED_RELEASE_TABLET | Freq: Every evening | ORAL | Status: DC

## 2017-06-08 MED ORDER — HYDROCORTISONE ACETATE 25 MG RE SUPP
25.0000 mg | Freq: Once | RECTAL | Status: AC
  Administered 2017-06-08: 25 mg via RECTAL
  Filled 2017-06-08: qty 1

## 2017-06-08 MED ORDER — METHOCARBAMOL 500 MG PO TABS
500.0000 mg | ORAL_TABLET | Freq: Four times a day (QID) | ORAL | Status: DC | PRN
  Administered 2017-06-09: 500 mg via ORAL
  Filled 2017-06-08 (×2): qty 1

## 2017-06-08 MED ORDER — GABAPENTIN 300 MG PO CAPS: 900.0000 mg | ORAL_CAPSULE | Freq: Three times a day (TID) | ORAL | Status: DC

## 2017-06-08 MED ORDER — GABAPENTIN 300 MG PO CAPS
900.0000 mg | ORAL_CAPSULE | Freq: Every day | ORAL | Status: DC
  Administered 2017-06-08: 900 mg via ORAL
  Filled 2017-06-08: qty 3

## 2017-06-08 MED ORDER — METHYLPREDNISOLONE SODIUM SUCC 125 MG IJ SOLR
125.0000 mg | Freq: Once | INTRAMUSCULAR | Status: AC
  Administered 2017-06-08: 125 mg via INTRAVENOUS
  Filled 2017-06-08: qty 2

## 2017-06-08 MED ORDER — GABAPENTIN 400 MG PO CAPS
1800.0000 mg | ORAL_CAPSULE | Freq: Every day | ORAL | Status: DC
  Filled 2017-06-08: qty 3

## 2017-06-08 MED ORDER — TIOTROPIUM BROMIDE MONOHYDRATE 18 MCG IN CAPS
18.0000 ug | ORAL_CAPSULE | Freq: Every day | RESPIRATORY_TRACT | Status: DC
  Filled 2017-06-08: qty 5

## 2017-06-08 MED ORDER — ONDANSETRON HCL 4 MG PO TABS: 4.0000 mg | ORAL_TABLET | Freq: Four times a day (QID) | ORAL | Status: DC | PRN

## 2017-06-08 MED ORDER — LEVOTHYROXINE SODIUM 75 MCG PO TABS
75.0000 ug | ORAL_TABLET | Freq: Every day | ORAL | Status: DC
  Administered 2017-06-09: 75 ug via ORAL
  Filled 2017-06-08: qty 1

## 2017-06-08 MED ORDER — FLUTICASONE PROPIONATE 50 MCG/ACT NA SUSP: 1.0000 | Freq: Every day | NASAL | Status: DC | PRN

## 2017-06-08 MED ORDER — ALBUTEROL SULFATE (2.5 MG/3ML) 0.083% IN NEBU
2.5000 mg | INHALATION_SOLUTION | RESPIRATORY_TRACT | Status: DC | PRN
  Administered 2017-06-08: 2.5 mg via RESPIRATORY_TRACT
  Filled 2017-06-08: qty 3

## 2017-06-08 MED ORDER — ENOXAPARIN SODIUM 40 MG/0.4ML ~~LOC~~ SOLN
40.0000 mg | SUBCUTANEOUS | Status: DC
  Administered 2017-06-08: 40 mg via SUBCUTANEOUS
  Filled 2017-06-08: qty 0.4

## 2017-06-08 MED ORDER — FLUOXETINE HCL 20 MG PO CAPS
60.0000 mg | ORAL_CAPSULE | Freq: Every day | ORAL | Status: DC
  Administered 2017-06-09: 60 mg via ORAL
  Filled 2017-06-08: qty 3

## 2017-06-08 MED ORDER — PANTOPRAZOLE SODIUM 40 MG PO TBEC
40.0000 mg | DELAYED_RELEASE_TABLET | Freq: Every day | ORAL | Status: DC
  Administered 2017-06-08: 40 mg via ORAL
  Filled 2017-06-08: qty 1

## 2017-06-08 MED ORDER — MEPERIDINE HCL 50 MG PO TABS
50.0000 mg | ORAL_TABLET | Freq: Four times a day (QID) | ORAL | Status: DC | PRN
  Administered 2017-06-08 – 2017-06-09 (×2): 50 mg via ORAL
  Filled 2017-06-08 (×2): qty 1

## 2017-06-08 MED ORDER — LORATADINE 10 MG PO TABS
10.0000 mg | ORAL_TABLET | Freq: Every day | ORAL | Status: DC
  Administered 2017-06-09: 10 mg via ORAL
  Filled 2017-06-08: qty 1

## 2017-06-08 MED ORDER — HYDROXYZINE HCL 25 MG PO TABS
25.0000 mg | ORAL_TABLET | Freq: Three times a day (TID) | ORAL | Status: DC | PRN
  Administered 2017-06-08: 25 mg via ORAL
  Filled 2017-06-08: qty 1

## 2017-06-08 MED ORDER — ALBUTEROL SULFATE (2.5 MG/3ML) 0.083% IN NEBU
2.5000 mg | INHALATION_SOLUTION | Freq: Four times a day (QID) | RESPIRATORY_TRACT | Status: DC
  Administered 2017-06-09 (×2): 2.5 mg via RESPIRATORY_TRACT
  Filled 2017-06-08 (×2): qty 3

## 2017-06-08 MED ORDER — GUAIFENESIN ER 600 MG PO TB12
600.0000 mg | ORAL_TABLET | Freq: Two times a day (BID) | ORAL | Status: DC
  Administered 2017-06-08 – 2017-06-09 (×2): 600 mg via ORAL
  Filled 2017-06-08 (×2): qty 1

## 2017-06-08 MED ORDER — GABAPENTIN 300 MG PO CAPS
900.0000 mg | ORAL_CAPSULE | Freq: Two times a day (BID) | ORAL | Status: DC
  Administered 2017-06-09: 900 mg via ORAL
  Filled 2017-06-08: qty 3

## 2017-06-08 MED ORDER — LORAZEPAM 1 MG PO TABS
1.0000 mg | ORAL_TABLET | Freq: Three times a day (TID) | ORAL | Status: DC | PRN
  Administered 2017-06-08: 1 mg via ORAL
  Filled 2017-06-08: qty 1

## 2017-06-08 MED ORDER — IPRATROPIUM-ALBUTEROL 0.5-2.5 (3) MG/3ML IN SOLN
3.0000 mL | Freq: Once | RESPIRATORY_TRACT | Status: AC
  Administered 2017-06-08: 3 mL via RESPIRATORY_TRACT
  Filled 2017-06-08: qty 3

## 2017-06-08 MED ORDER — ALBUTEROL SULFATE (2.5 MG/3ML) 0.083% IN NEBU
5.0000 mg | INHALATION_SOLUTION | Freq: Once | RESPIRATORY_TRACT | Status: AC
  Administered 2017-06-08: 5 mg via RESPIRATORY_TRACT
  Filled 2017-06-08: qty 6

## 2017-06-08 MED ORDER — LEVALBUTEROL HCL 0.63 MG/3ML IN NEBU
0.6300 mg | INHALATION_SOLUTION | Freq: Once | RESPIRATORY_TRACT | Status: AC
Start: 2017-06-08 — End: 2017-06-08
  Administered 2017-06-08: 0.63 mg via RESPIRATORY_TRACT
  Filled 2017-06-08: qty 3

## 2017-06-08 MED ORDER — ALBUTEROL SULFATE (2.5 MG/3ML) 0.083% IN NEBU: 3.0000 mL | INHALATION_SOLUTION | Freq: Four times a day (QID) | RESPIRATORY_TRACT | Status: DC | PRN

## 2017-06-08 MED ORDER — POLYETHYLENE GLYCOL 3350 17 G PO PACK: 17.0000 g | PACK | Freq: Every day | ORAL | Status: DC | PRN

## 2017-06-08 NOTE — H&P (Signed)
History and Physical  Tracy BlaseCheryl C Savage ZOX:[id number] DOB: January 10, 1956 DOA: 06/08/2017  Referring physician: Dr Jeraldine LootsLockwood, ED physician PCP: Gareth MorganKnowlton, Steve, MD  Outpatient Specialists:  [name] (Neurosurgery) [name] (Gen Surg)  Patient Coming From: home  Chief Complaint: SOB  HPI: [name] is a 62 y.o. female with a history of COPD, anxiety, GERD, seizure disorder on gabapentin and without seizures since the 90s, history of TIA reported by patient.  Patient seen for shortness of breath that has been progressing over the past 4 days.  Symptoms are worsening.  Worse with exertion and improved with rest.  She does have a nonproductive, wheezing cough that is also been worsening.  Emergency Department Course: Patient received Solu-Medrol, nebulizer treatments in the emergency department with mild improvement.  Patient still having pursed lip breathing and is still tachypneic  Review of Systems:   Pt denies any fevers, chills, nausea, vomiting, diarrhea, constipation, abdominal pain, shortness of breath, dyspnea on exertion, orthopnea, cough, wheezing, palpitations, headache, vision changes, lightheadedness, dizziness, melena, rectal bleeding.  Review of systems are otherwise negative  Past Medical History:  Diagnosis Date  . Allergic rhinitis   . Anxiety   . Arthritis   . Asthma   . Atypical chest pain    cath 2007 showing normal coronary arteries, last echo 2003 with normal EF and no systolic dysfunction  . Broken ribs    Trauma falling off of horse November 2012  . Chest pain   . Chronic pain syndrome   . CVA (cerebral infarction)    reported by patient, no documentation  . Depression    history of prior suicide attempts  . DVT (deep venous thrombosis) (HCC)    remote at age 62  . GERD (gastroesophageal reflux disease)   . Headache   . Herniated nucleus pulposus, L4-5 left 2001   s/p Left microsurgical exploration L4-5 and microdiskectomy with lysis  . Hypotension      history of hypotension in the past requiring fluid boluses  . Hypothyroidism   . Mitral valve prolapse   . Mitral valve prolapse   . Pneumonia 08/29/2011  . Seizures (HCC)    in the 1990's; takes neurontin for this;no seizures since then.  . Shortness of breath   . Stroke (HCC)    hx of TIA  . TIA (transient ischemic attack)    Past Surgical History:  Procedure Laterality Date  . ABDOMINAL HYSTERECTOMY    . APPENDECTOMY    . CERVICAL SPINE SURGERY    . CHOLECYSTECTOMY    . cholescystectomy  2003    Laparoscopic cholecystectomy with intraoperative  . ELBOW SURGERY Right    3 different surgeries   . INCISIONAL HERNIA REPAIR N/A 01/21/2016   Procedure: HERNIA REPAIR INCISIONAL WITH MESH;  Surgeon: Franky MachoMark Jenkins, MD;  Location: [facility];  Service: General;  Laterality: N/A;  . KNEE SURGERY Bilateral   . lower back surgery    . NERVE, TENDON AND ARTERY REPAIR Left 05/09/2013   Procedure: LEFT WRIST EXPLORATION ;  Surgeon: Tami RibasKevin R Kuzma, MD;  Location: [facility];  Service: Orthopedics;  Laterality: Left;  . OOPHORECTOMY    . right arm surgery     carpal tunnel; and tendon repair of elbow.  [name]. SHOULDER SURGERY Right   . TONSILLECTOMY     Social History:  reports that  has never smoked. she has never used smokeless tobacco. She reports that she does not drink alcohol or use drugs. Patient lives at home  Allergies  Allergen Reactions  . Amoxicillin Hives, Rash and Other (See Comments)    REACTION: Stomach bleeding    . Ampicillin Rash and Other (See Comments)    REACTION: Stomach bleeding  . Aspirin Other (See Comments)    REACTION: ulcer and GI bleeding. Patient states she can take the 81mg  aspirin-NO 325MG   . Chlorzoxazone Rash and Other (See Comments)    Caused pleural effusion/excessive fluid buildup  . Ciprofloxacin Anaphylaxis    REACTION: Throat swells shut  . Heparin Other (See Comments)    MAKES HEART STOP BEATING.   . Indomethacin Other (See Comments)     MAKES HEART STOP BEATING.   . Nsaids Other (See Comments)    REACTION: Stomach bleeding  . Penicillins Hives, Shortness Of Breath and Swelling    Has patient had a PCN reaction causing immediate rash, facial/tongue/throat swelling, SOB or lightheadedness with hypotension: Yes Has patient had a PCN reaction causing severe rash involving mucus membranes or skin necrosis: Yes Has patient had a PCN reaction that required hospitalization: Unknown Has patient had a PCN reaction occurring within the last 10 years: No If all of the above answers are "NO", then may proceed with Cephalosporin use.   . Albumin (Human) Rash and Other (See Comments)    Unknown other reactions  . Cephalexin Hives, Itching and Rash  . Chlorpromazine Rash and Other (See Comments)    hyperactivity  . Darvocet [Propoxyphene N-Acetaminophen] Itching, Nausea And Vomiting and Rash  . Dilaudid [Hydromorphone Hcl] Itching, Nausea And Vomiting and Swelling  . Fentanyl Hives and Nausea And Vomiting  . Hydrocodone Nausea And Vomiting  . Ketorolac Tromethamine Itching, Nausea And Vomiting and Rash  . Nortriptyline Hcl Itching, Rash and Other (See Comments)    REACTION: Hyperactive  . Oxycodone Nausea And Vomiting  . Pentazocine Nausea And Vomiting and Rash  . Pentazocine-Naloxone Nausea And Vomiting  . Percocet [Oxycodone-Acetaminophen] Itching, Nausea And Vomiting and Rash  . Propoxyphene Itching, Nausea And Vomiting and Rash  . Sertraline Hcl Other (See Comments)    REACTION: Hyperactive - bounce off the wall  . Tramadol Hcl Nausea And Vomiting  . Latex Hives  . Sulfa Antibiotics     Per patient, can not take sulfa antibiotics, patient unsure her reaction but knows she "cant have it"  . Codeine Rash  . Hyoscyamine Sulfate Other (See Comments)  . Imipramine Itching and Rash  . Tramadol Rash    Family History  Problem Relation Age of Onset  . Heart attack Brother        CABG  . Hypertrophic cardiomyopathy Son    . Cancer Mother        Uterine  . Cancer Father        brain  . Cancer Maternal Aunt        cancer  . Cancer Maternal Aunt        breast  . Cancer Maternal Aunt        breast  . Cancer Cousin        breast  . Cancer Cousin        breast  . Cancer Cousin        breast      Prior to Admission medications   Medication Sig Start Date End Date Taking? Authorizing Provider  albuterol (PROVENTIL HFA;VENTOLIN HFA) 108 (90 BASE) MCG/ACT inhaler Inhale 2 puffs into the lungs every 6 (six) hours as needed for wheezing. 08/30/11  Yes Wainright, Carlis Stable, MD  aspirin  EC 81 MG tablet Take 81 mg by mouth every evening.   Yes [provider]  cetirizine (ZYRTEC) 10 MG tablet Take 10 mg by mouth 2 (two) times daily.    Yes [provider]  cyclobenzaprine (FLEXERIL) 10 MG tablet Take 1 tablet (10 mg total) by mouth 2 (two) times daily as needed for muscle spasms. Patient taking differently: Take 10 mg by mouth 3 (three) times daily as needed for muscle spasms.  04/12/15  Yes Alvira Monday, MD  FLUoxetine (PROZAC) 20 MG capsule Take 60 mg by mouth daily.   Yes [provider]  fluticasone (FLONASE) 50 MCG/ACT nasal spray Place 1 spray into both nostrils daily as needed for allergies or rhinitis.   Yes [provider]  gabapentin (NEURONTIN) 300 MG capsule Take 900-1,800 mg by mouth 3 (three) times daily. Takes 3 capsules in the morning, 3 capsules in the afternoon, and 6 capsules at bedtime.   Yes [provider]  hydrOXYzine (VISTARIL) 25 MG capsule Take 1 capsule by mouth 3 (three) times daily as needed for itching.  04/20/13  Yes [provider]  levothyroxine (SYNTHROID, LEVOTHROID) 75 MCG tablet Take 75 mcg by mouth daily before breakfast.   Yes [provider]  LORazepam (ATIVAN) 1 MG tablet Take 1 mg by mouth every 8 (eight) hours as needed for anxiety.    Yes [provider]  meloxicam (MOBIC) 15 MG tablet Take 15 mg by  mouth daily.    Yes [provider]  meperidine (DEMEROL) 50 MG tablet Take 1 tablet (50 mg total) by mouth every 6 (six) hours as needed for moderate pain. FOR PAIN 01/21/16  Yes Franky Macho, MD  methocarbamol (ROBAXIN) 500 MG tablet Take 1 tablet (500 mg total) by mouth every 6 (six) hours as needed for muscle spasms. 06/08/16  Yes Barnett Abu, MD  methylphenidate (RITALIN) 10 MG tablet Take 10 mg by mouth 2 (two) times daily as needed.   Yes [provider]  metoprolol succinate (TOPROL-XL) 50 MG 24 hr tablet Take 50 mg by mouth daily. Take with or immediately following a meal.   Yes [provider]  omeprazole (PRILOSEC) 20 MG capsule Take 1 capsule (20 mg total) by mouth 2 (two) times daily. Patient taking differently: Take 20 mg by mouth 2 (two) times daily before a meal.  02/15/15  Yes Gerhard Munch, MD  valACYclovir (VALTREX) 500 MG tablet Take 500 mg by mouth daily as needed (for sun exposure).   Yes [provider]  dexamethasone (DECADRON) 1 MG tablet 2 tablets twice daily for 2 days, one tablet twice daily for 2 days, one tablet daily for 2 days. Patient not taking: Reported on 11/15/2016 06/08/16   Barnett Abu, MD  meperidine (DEMEROL) 50 MG tablet Take 1 tablet (50 mg total) by mouth every 4 (four) hours as needed for severe pain. Patient not taking: Reported on 06/08/2017 06/08/16   Barnett Abu, MD    Physical Exam: BP (!) 151/68   Pulse 68   Temp 98.2 F (36.8 C) (Oral)   Resp (!) 22   Ht 5\' 1"  (1.549 m)   Wt 87.4 kg (192 lb 10.9 oz)   SpO2 100%   BMI 36.41 kg/m   General: Older Caucasian female. Awake and alert and oriented x3. No acute cardiopulmonary distress.  HEENT: Normocephalic atraumatic.  Right and left ears normal in appearance.  Pupils equal, round, reactive to light. Extraocular muscles are intact. Sclerae anicteric and noninjected.  Moist mucosal membranes. No mucosal lesions.  Neck: Neck supple without lymphadenopathy. No  carotid bruits. No masses palpated.  Cardiovascular: Regular rate with normal S1-S2 sounds. No murmurs, rubs, gallops auscultated. No JVD.  Respiratory: Slightly diminished respiratory effort, but with no wheezes, rales, rhonchi.  Lungs currently clear to auscultation.  No accessory muscle use. Abdomen: Soft, nontender, nondistended. Active bowel sounds. No masses or hepatosplenomegaly  Skin: No rashes, lesions, or ulcerations.  Dry, warm to touch. 2+ dorsalis pedis and radial pulses. Musculoskeletal: No calf or leg pain. All major joints not erythematous nontender.  No upper or lower joint deformation.  Good ROM.  No contractures  Psychiatric: Intact judgment and insight. Pleasant and cooperative. Neurologic: No focal neurological deficits. Strength is 5/5 and symmetric in upper and lower extremities.  Cranial nerves II through XII are grossly intact.           Labs on Admission: I have personally reviewed following labs and imaging studies  CBC: Recent Labs  Lab 06/08/17 1235  WBC 9.8  NEUTROABS 6.0  HGB 14.0  HCT 43.8  MCV 89.0  PLT 354   Basic Metabolic Panel: Recent Labs  Lab 06/08/17 1235  NA 136  K 4.1  CL 98*  CO2 20*  GLUCOSE 115*  BUN 11  CREATININE 0.90  CALCIUM 9.4   GFR: Estimated Creatinine Clearance: 66.7 mL/min (by C-G formula based on SCr of 0.9 mg/dL). Liver Function Tests: Recent Labs  Lab 06/08/17 1235  AST 36  ALT 28  ALKPHOS 96  BILITOT 0.7  PROT 8.0  ALBUMIN 4.2   No results for input(s): LIPASE, AMYLASE in the last 168 hours. No results for input(s): AMMONIA in the last 168 hours. Coagulation Profile: No results for input(s): INR, PROTIME in the last 168 hours. Cardiac Enzymes: Recent Labs  Lab 06/08/17 1235  TROPONINI <0.03   BNP (last 3 results) No results for input(s): PROBNP in the last 8760 hours. HbA1C: No results for input(s): HGBA1C in the last 72 hours. CBG: No results for input(s): GLUCAP in the last 168 hours. Lipid  Profile: No results for input(s): CHOL, HDL, LDLCALC, TRIG, CHOLHDL, LDLDIRECT in the last 72 hours. Thyroid Function Tests: No results for input(s): TSH, T4TOTAL, FREET4, T3FREE, THYROIDAB in the last 72 hours. Anemia Panel: No results for input(s): VITAMINB12, FOLATE, FERRITIN, TIBC, IRON, RETICCTPCT in the last 72 hours. Urine analysis:    Component Value Date/Time   COLORURINE STRAW (A) 06/07/2016 2204   APPEARANCEUR CLEAR 06/07/2016 2204   LABSPEC 1.008 06/07/2016 2204   PHURINE 8.0 06/07/2016 2204   GLUCOSEU NEGATIVE 06/07/2016 2204   HGBUR NEGATIVE 06/07/2016 2204   BILIRUBINUR NEGATIVE 06/07/2016 2204   KETONESUR NEGATIVE 06/07/2016 2204   PROTEINUR NEGATIVE 06/07/2016 2204   UROBILINOGEN 1.0 08/29/2011 0827   NITRITE NEGATIVE 06/07/2016 2204   LEUKOCYTESUR NEGATIVE 06/07/2016 2204   Sepsis Labs: @LABRCNTIP (procalcitonin:4,lacticidven:4) )No results found for this or any previous visit (from the past 240 hour(s)).   Radiological Exams on Admission: Dg Chest 2 View  Result Date: 06/08/2017 CLINICAL DATA:  Nonproductive cough.  Congestion. EXAM: CHEST  2 VIEW COMPARISON:  No prior. FINDINGS: Mediastinum and hilar structures normal. Lungs are clear. Heart size normal. No pleural effusion or pneumothorax. Degenerative change thoracic spine. Thoracic spine fusion. IMPRESSION: No acute cardiopulmonary disease. Electronically Signed   By: Maisie Fus  Register   On: 06/08/2017 13:02    EKG: Independently reviewed.  Sinus tachycardia.  Biatrial enlargement.  Assessment/Plan: Principal Problem:   COPD with  acute exacerbation (HCC) Active Problems:   Anxiety state   Chronic pain syndrome   GERD   IBS   Seizure disorder [facility])    This patient was discussed with the ED physician, including pertinent vitals, physical exam findings, labs, and imaging.  We also discussed care given by the ED provider.  1. COPD with acute exacerbation 1. Admit Antibiotics: None has not having  purulent sputum DuoNeb's every 6 scheduled with albuterol every 2 when necessary Continue inhaled steroids and LA bronchodilator Solu-Medrol 60 mg IV every 12 hours Mucinex 2. Chronic pain 1. Continue gabapentin, NSAIDs 3. Seizure disorder 1. Continue gabapentin 4. IBS 1. Stool regimen as at home 5. GERD 1. Continue PPI 6. Anxiety 1. On chronic benzos  DVT prophylaxis: Lovenox Consultants: None Code Status: Full code Family Communication: None Disposition Plan: Patient to return home following admission   Noralee Chars [facility] Pager [phone]  If 7PM-7AM, please contact night-coverage [email or web address] Password [email or web address]

## 2017-06-08 NOTE — ED Provider Notes (Signed)
[facility] EMERGENCY DEPARTMENT Provider Note   CSN: [id number] Arrival date & time: 06/08/17  1215     History   Chief Complaint Chief Complaint  Patient presents with  . Shortness of Breath    HPI [name] is a 62 y.o. female.  HPI  Patient presents with concern of dyspnea.  Onset was about 4 days ago, since onset symptoms been persistent, worsening, despite of using albuterol. There is some generalized discomfort, no fever. There is cough. Patient has a history of asthma, denies history of coronary disease. No confusion, disorientation, no abdominal pain nausea, vomiting, diarrhea.   Past Medical History:  Diagnosis Date  . Allergic rhinitis   . Anxiety   . Arthritis   . Asthma   . Atypical chest pain    cath 2007 showing normal coronary arteries, last echo 2003 with normal EF and no systolic dysfunction  . Broken ribs    Trauma falling off of horse November 2012  . Chest pain   . Chronic pain syndrome   . CVA (cerebral infarction)    reported by patient, no documentation  . Depression    history of prior suicide attempts  . DVT (deep venous thrombosis) (HCC)    remote at age 17  . GERD (gastroesophageal reflux disease)   . Headache   . Herniated nucleus pulposus, L4-5 left 2001   s/p Left microsurgical exploration L4-5 and microdiskectomy with lysis  . Hypotension    history of hypotension in the past requiring fluid boluses  . Hypothyroidism   . Mitral valve prolapse   . Mitral valve prolapse   . Pneumonia 08/29/2011  . Seizures (HCC)    in the 1990's; takes neurontin for this;no seizures since then.  . Shortness of breath   . Stroke (HCC)    hx of TIA  . TIA (transient ischemic attack)     Patient Active Problem List   Diagnosis Date Noted  . Spondylolisthesis at L4-L5 level 06/05/2016  . Atypical pneumonia 08/29/2011  . Chest pain 08/29/2011  . Dyspnea 03/15/2011  . OPEN WOUND FT NO TOE ALONE WITHOUT MENTION COMP 05/22/2007  . VIRAL  INFECTION 05/07/2007  . NASOPHARYNGITIS, ACUTE 05/07/2007  . OBESITY 03/25/2007  . ANXIETY 03/25/2007  . DEPRESSION 03/25/2007  . CHRONIC PAIN SYNDROME 03/25/2007  . MIGRAINE HEADACHE 03/25/2007  . CARPAL TUNNEL SYNDROME 03/25/2007  . NEUROPATHY 03/25/2007  . ABNORMAL HEART RHYTHMS 03/25/2007  . ALLERGIC RHINITIS 03/25/2007  . BRONCHITIS, CHRONIC 03/25/2007  . ASTHMA 03/25/2007  . GERD 03/25/2007  . PUD 03/25/2007  . IBS 03/25/2007  . ARTHRITIS 03/25/2007  . LOW BACK PAIN, CHRONIC 03/25/2007  . FIBROMYALGIA 03/25/2007  . SEIZURE DISORDER 03/25/2007  . MEMORY LOSS 03/25/2007  . ABDOMINAL PAIN 03/25/2007    Past Surgical History:  Procedure Laterality Date  . ABDOMINAL HYSTERECTOMY    . APPENDECTOMY    . CERVICAL SPINE SURGERY    . CHOLECYSTECTOMY    . cholescystectomy  2003    Laparoscopic cholecystectomy with intraoperative  . ELBOW SURGERY Right    3 different surgeries   . INCISIONAL HERNIA REPAIR N/A 01/21/2016   Procedure: HERNIA REPAIR INCISIONAL WITH MESH;  Surgeon: Franky Macho, MD;  Location: [facility];  Service: General;  Laterality: N/A;  . KNEE SURGERY Bilateral   . lower back surgery    . NERVE, TENDON AND ARTERY REPAIR Left 05/09/2013   Procedure: LEFT WRIST EXPLORATION ;  Surgeon: Tami Ribas, MD;  Location: [facility];  Service: Orthopedics;  Laterality: Left;  . OOPHORECTOMY    . right arm surgery     carpal tunnel; and tendon repair of elbow.  Marland Kitchen SHOULDER SURGERY Right   . TONSILLECTOMY      OB History    No data available       Home Medications    Prior to Admission medications   Medication Sig Start Date End Date Taking? Authorizing Provider  albuterol (PROVENTIL HFA;VENTOLIN HFA) 108 (90 BASE) MCG/ACT inhaler Inhale 2 puffs into the lungs every 6 (six) hours as needed for wheezing. 08/30/11  Yes Wainright, Carlis Stable, MD  aspirin EC 81 MG tablet Take 81 mg by mouth every evening.   Yes [provider]  cetirizine (ZYRTEC)  10 MG tablet Take 10 mg by mouth 2 (two) times daily.    Yes [provider]  cyclobenzaprine (FLEXERIL) 10 MG tablet Take 1 tablet (10 mg total) by mouth 2 (two) times daily as needed for muscle spasms. Patient taking differently: Take 10 mg by mouth 3 (three) times daily as needed for muscle spasms.  04/12/15  Yes Alvira Monday, MD  FLUoxetine (PROZAC) 20 MG capsule Take 60 mg by mouth daily.   Yes [provider]  fluticasone (FLONASE) 50 MCG/ACT nasal spray Place 1 spray into both nostrils daily as needed for allergies or rhinitis.   Yes [provider]  gabapentin (NEURONTIN) 300 MG capsule Take 900-1,800 mg by mouth 3 (three) times daily. Takes 3 capsules in the morning, 3 capsules in the afternoon, and 6 capsules at bedtime.   Yes [provider]  hydrOXYzine (VISTARIL) 25 MG capsule Take 1 capsule by mouth 3 (three) times daily as needed for itching.  04/20/13  Yes [provider]  levothyroxine (SYNTHROID, LEVOTHROID) 75 MCG tablet Take 75 mcg by mouth daily before breakfast.   Yes [provider]  LORazepam (ATIVAN) 1 MG tablet Take 1 mg by mouth every 8 (eight) hours as needed for anxiety.    Yes [provider]  meloxicam (MOBIC) 15 MG tablet Take 15 mg by mouth daily.    Yes [provider]  meperidine (DEMEROL) 50 MG tablet Take 1 tablet (50 mg total) by mouth every 6 (six) hours as needed for moderate pain. FOR PAIN 01/21/16  Yes Franky Macho, MD  methocarbamol (ROBAXIN) 500 MG tablet Take 1 tablet (500 mg total) by mouth every 6 (six) hours as needed for muscle spasms. 06/08/16  Yes Barnett Abu, MD  methylphenidate (RITALIN) 10 MG tablet Take 10 mg by mouth 2 (two) times daily as needed.   Yes [provider]  metoprolol succinate (TOPROL-XL) 50 MG 24 hr tablet Take 50 mg by mouth daily. Take with or immediately following a meal.   Yes [provider]  omeprazole (PRILOSEC) 20 MG capsule Take 1  capsule (20 mg total) by mouth 2 (two) times daily. Patient taking differently: Take 20 mg by mouth 2 (two) times daily before a meal.  02/15/15  Yes Gerhard Munch, MD  valACYclovir (VALTREX) 500 MG tablet Take 500 mg by mouth daily as needed (for sun exposure).   Yes [provider]  dexamethasone (DECADRON) 1 MG tablet 2 tablets twice daily for 2 days, one tablet twice daily for 2 days, one tablet daily for 2 days. Patient not taking: Reported on 11/15/2016 06/08/16   Barnett Abu, MD  meperidine (DEMEROL) 50 MG tablet Take 1 tablet (50 mg total) by mouth every 4 (four) hours  as needed for severe pain. Patient not taking: Reported on 06/08/2017 06/08/16   Barnett Abu, MD    Family History Family History  Problem Relation Age of Onset  . Heart attack Brother        CABG  . Hypertrophic cardiomyopathy Son   . Cancer Mother        Uterine  . Cancer Father        brain  . Cancer Maternal Aunt        cancer  . Cancer Maternal Aunt        breast  . Cancer Maternal Aunt        breast  . Cancer Cousin        breast  . Cancer Cousin        breast  . Cancer Cousin        breast    Social History Social History   Tobacco Use  . Smoking status: Never Smoker  . Smokeless tobacco: Never Used  Substance Use Topics  . Alcohol use: No    Alcohol/week: 0.0 oz  . Drug use: No     Allergies   Amoxicillin; Ampicillin; Aspirin; Chlorzoxazone; Ciprofloxacin; Heparin; Indomethacin; Nsaids; Penicillins; Albumin (human); Cephalexin; Chlorpromazine; Darvocet [propoxyphene n-acetaminophen]; Dilaudid [hydromorphone hcl]; Fentanyl; Hydrocodone; Ketorolac tromethamine; Nortriptyline hcl; Oxycodone; Pentazocine; Pentazocine-naloxone; Percocet [oxycodone-acetaminophen]; Propoxyphene; Sertraline hcl; Tramadol hcl; Latex; Sulfa antibiotics; Codeine; Hyoscyamine sulfate; Imipramine; and Tramadol   Review of Systems Review of Systems  Constitutional:       Per HPI, otherwise negative    HENT:       Per HPI, otherwise negative  Respiratory:       Per HPI, otherwise negative  Cardiovascular:       Per HPI, otherwise negative  Gastrointestinal: Negative for vomiting.  Endocrine:       Negative aside from HPI  Genitourinary:       Neg aside from HPI   Musculoskeletal:       Per HPI, otherwise negative  Skin: Negative.   Neurological: Negative for syncope.     Physical Exam Updated Vital Signs BP 131/88   Pulse 98   Resp (!) 9   Ht 5' (1.524 m)   Wt 90.7 kg (200 lb)   SpO2 100%   BMI 39.06 kg/m   Physical Exam  Constitutional: She is oriented to person, place, and time. She appears well-developed and well-nourished. No distress.  HENT:  Head: Normocephalic and atraumatic.  Eyes: Conjunctivae and EOM are normal.  Cardiovascular: Regular rhythm.  Tachycardic  Pulmonary/Chest: Effort normal. No stridor. No respiratory distress. She has decreased breath sounds. She has wheezes.  Abdominal: She exhibits no distension.  Musculoskeletal: She exhibits no edema.  Neurological: She is alert and oriented to person, place, and time. No cranial nerve deficit.  Skin: Skin is warm and dry.  Psychiatric: She has a normal mood and affect.  Nursing note and vitals reviewed.    ED Treatments / Results  Labs (all labs ordered are listed, but only abnormal results are displayed) Labs Reviewed  COMPREHENSIVE METABOLIC PANEL - Abnormal; Notable for the following components:      Result Value   Chloride 98 (*)    CO2 20 (*)    Glucose, Bld 115 (*)    Anion gap 18 (*)    All other components within normal limits  CBC WITH DIFFERENTIAL/PLATELET  TROPONIN I  BRAIN NATRIURETIC PEPTIDE    EKG  EKG Interpretation  Date/Time:  Friday June 08 2017  12:30:04 EST Ventricular Rate:  105 PR Interval:    QRS Duration: 84 QT Interval:  340 QTC Calculation: 450 R Axis:   79 Text Interpretation:  Sinus tachycardia Biatrial enlargement Confirmed by Gerhard MunchLockwood, Marvon Shillingburg  [phone]) on 06/08/2017 12:50:30 PM       Radiology Dg Chest 2 View  Result Date: 06/08/2017 CLINICAL DATA:  Nonproductive cough.  Congestion. EXAM: CHEST  2 VIEW COMPARISON:  No prior. FINDINGS: Mediastinum and hilar structures normal. Lungs are clear. Heart size normal. No pleural effusion or pneumothorax. Degenerative change thoracic spine. Thoracic spine fusion. IMPRESSION: No acute cardiopulmonary disease. Electronically Signed   By: Maisie Fushomas  Register   On: 06/08/2017 13:02    Procedures Procedures (including critical care time)  Medications Ordered in ED Medications  albuterol (PROVENTIL) (2.5 MG/3ML) 0.083% nebulizer solution 5 mg (5 mg Nebulization Given 06/08/17 1329)  methylPREDNISolone sodium succinate (SOLU-MEDROL) 125 mg/2 mL injection 125 mg (125 mg Intravenous Given 06/08/17 1324)  ipratropium-albuterol (DUONEB) 0.5-2.5 (3) MG/3ML nebulizer solution 3 mL (3 mLs Nebulization Given 06/08/17 1512)     Initial Impression / Assessment and Plan / ED Course  I have reviewed the triage vital signs and the nursing notes.  Pertinent labs & imaging results that were available during my care of the patient were reviewed by me and considered in my medical decision making (see chart for details).     Update: Patient improved.  4:19 PM Patient much improved, with much more normal breath sounds throughout.  Because all findings including reassuring labs, EKG, chest x-ray.   This 62 year old female with a history of COPD presents due to dyspnea. Here she is initially dyspneic, with diminished breath sounds, wheezing. She improved substantially here after multiple breathing treatments, steroids. She notes the typical reaction to breathing treatments, which she has at home as well, with bowel movements following treatment, and now complains of some perirectal discomfort. No evidence for pneumonia, ACS, heart failure.  5:51 PM Patient continues to exhibit pursed lip breathing, tachypnea,  though wheezing has improved. Given the absence of substantial improvement in spite of steroids, multiple different breathing treatments, the patient will require admission for further evaluation and management of her COPD exacerbation.    Final Clinical Impressions(s) / ED Diagnoses   COPD exacerbation  Gerhard MunchLockwood, Chancelor Hardrick, MD 06/08/17 1752

## 2017-06-08 NOTE — ED Triage Notes (Signed)
Pt reports cough and sob x 4 days.  Reports went to Dr. Michelle NasutiKnowlton's office today and they referred her here.  C/O bilateral rib pain.   Has been using nebulizer treatments and inhaler without relief.

## 2017-06-09 ENCOUNTER — Other Ambulatory Visit: Payer: Self-pay

## 2017-06-09 DIAGNOSIS — J209 Acute bronchitis, unspecified: Secondary | ICD-10-CM | POA: Diagnosis not present

## 2017-06-09 DIAGNOSIS — G894 Chronic pain syndrome: Secondary | ICD-10-CM | POA: Diagnosis not present

## 2017-06-09 DIAGNOSIS — R0602 Shortness of breath: Secondary | ICD-10-CM | POA: Diagnosis not present

## 2017-06-09 LAB — BASIC METABOLIC PANEL
ANION GAP: 15 (ref 5–15)
BUN: 21 mg/dL — AB (ref 6–20)
CO2: 22 mmol/L (ref 22–32)
Calcium: 9.2 mg/dL (ref 8.9–10.3)
Chloride: 99 mmol/L — ABNORMAL LOW (ref 101–111)
Creatinine, Ser: 0.88 mg/dL (ref 0.44–1.00)
GFR calc Af Amer: >60 mL/min (ref >60)
GLUCOSE: 136 mg/dL — AB (ref 65–99)
POTASSIUM: 3.9 mmol/L (ref 3.5–5.1)
Sodium: 136 mmol/L (ref 135–145)

## 2017-06-09 LAB — RESPIRATORY PANEL BY PCR
Adenovirus: NOT DETECTED
BORDETELLA PERTUSSIS-RVPCR: NOT DETECTED
CHLAMYDOPHILA PNEUMONIAE-RVPPCR: NOT DETECTED
CORONAVIRUS HKU1-RVPPCR: NOT DETECTED
Coronavirus 229E: NOT DETECTED
Coronavirus NL63: NOT DETECTED
Coronavirus OC43: NOT DETECTED
INFLUENZA A-RVPPCR: NOT DETECTED
INFLUENZA B-RVPPCR: NOT DETECTED
METAPNEUMOVIRUS-RVPPCR: NOT DETECTED
Mycoplasma pneumoniae: NOT DETECTED
PARAINFLUENZA VIRUS 2-RVPPCR: NOT DETECTED
PARAINFLUENZA VIRUS 3-RVPPCR: NOT DETECTED
PARAINFLUENZA VIRUS 4-RVPPCR: NOT DETECTED
Parainfluenza Virus 1: NOT DETECTED
RESPIRATORY SYNCYTIAL VIRUS-RVPPCR: NOT DETECTED
RHINOVIRUS / ENTEROVIRUS - RVPPCR: NOT DETECTED

## 2017-06-09 MED ORDER — PREDNISONE 20 MG PO TABS: 40.0000 mg | ORAL_TABLET | Freq: Every day | ORAL | 0 refills | Status: AC

## 2017-06-09 MED ORDER — DOXYCYCLINE HYCLATE 100 MG PO TABS: 100.0000 mg | ORAL_TABLET | Freq: Two times a day (BID) | ORAL | 0 refills | Status: AC

## 2017-06-09 NOTE — Care Management Note (Signed)
Case Management Note  Patient Details  Name: [name] MRN: [id number] Date of Birth: Aug 09, 1955  Subjective/Objective: code 7044                   Action/Plan:[facility] to deliver code 44/MOOn notice.   Expected Discharge Date:  06/09/17               Expected Discharge Plan:     In-House Referral:     Discharge planning Services     Post Acute Care Choice:    Choice offered to:     DME Arranged:    DME Agency:     HH Arranged:    HH Agency:     Status of Service:     If discussed at [employer] Meetings, dates discussed:    Additional Comments:  Caren MacadamMichelle Tamee Battin, RN 06/09/2017, 11:44 AM

## 2017-06-09 NOTE — Progress Notes (Signed)
Discharge instructions reviewed with patient. Given copy of AVS and prescriptions. Verbalized understanding. IV site d/c'd and site within normal limits. Pt in stable condition awaiting discharge home after finishing lunch. Earnstine RegalAshley Haji Delaine, RN

## 2017-06-09 NOTE — Progress Notes (Signed)
Patient noted to have potential code 6044 when reviewing AVS. Notified on call case manager. States she will address and call nursing back. Earnstine RegalAshley Dwain Huhn, RN

## 2017-06-09 NOTE — Discharge Instructions (Signed)

## 2017-06-09 NOTE — Discharge Summary (Signed)
Physician Discharge Summary  [name] AVW:[id number] DOB: 1956/03/27 DOA: 06/08/2017  PCP: Gareth MorganKnowlton, Steve, MD  Admit date: 06/08/2017 Discharge date: 06/09/2017  Admitted From: Home Disposition: Home  Recommendations for Outpatient Follow-up:  Follow up with PCP in 1- week.  Patient will be discharged on 5-day course of oral prednisone and doxycycline.   Home Health:None Equipment/Devices: None  Discharge Condition: Fair CODE STATUS: Full code Diet recommendation: Regular    Discharge Diagnoses:  Principal Problem:   Acute bronchitis  Active Problems:   Anxiety state   Chronic pain syndrome   GERD   IBS   Seizure disorder (HCC)  Brief narrative/HPI Please refer to admission H&P for details, in brief, 62 year old female with history of childhood asthma, anxiety, GERD, seizure disorder, history of TIA (per patient) presented with shortness of breath with nonproductive cough for past 4 days.  Symptoms worse with exertion and associated with wheezing. Patient denies any fevers, chills, nausea, vomiting, chest pain, palpitations, abdominal pain, bowel or urinary symptoms. Placed on observation for acute bronchitis.  Hospital course Acute bronchitis [facility](HCC) Patient received IV Solu-Medrol and nebulizer treatment in the ED with some improvement.  Patient started on treatment with steroids and nebulizer for suspected COPD.  However on questioning she does not have an underlying diagnosis of COPD and has remote asthma.  She does not smoke.  Her symptoms are likely of acute bronchitis only. Symptoms have much improved this morning.  I will discharged her on a 5-day course of oral prednisone along with 5-day course of oral doxycycline and outpatient antitussives. Follow-up with PCP in 1 week.  Remaining medical conditions are stable.  Continue home medications.   Procedure: None Family medication: Husband at bedside  Disposition: Home  Discharge Instructions   Allergies  as of 06/09/2017      Reactions   Amoxicillin Hives, Rash, Other (See Comments)   REACTION: Stomach bleeding   Ampicillin Rash, Other (See Comments)   REACTION: Stomach bleeding   Aspirin Other (See Comments)   REACTION: ulcer and GI bleeding. Patient states she can take the 81mg  aspirin-NO 325MG    Chlorzoxazone Rash, Other (See Comments)   Caused pleural effusion/excessive fluid buildup   Ciprofloxacin Anaphylaxis   REACTION: Throat swells shut   Heparin Other (See Comments)   MAKES HEART STOP BEATING.    Indomethacin Other (See Comments)   MAKES HEART STOP BEATING.    Nsaids Other (See Comments)   REACTION: Stomach bleeding   Penicillins Hives, Shortness Of Breath, Swelling   Has patient had a PCN reaction causing immediate rash, facial/tongue/throat swelling, SOB or lightheadedness with hypotension: Yes Has patient had a PCN reaction causing severe rash involving mucus membranes or skin necrosis: Yes Has patient had a PCN reaction that required hospitalization: Unknown Has patient had a PCN reaction occurring within the last 10 years: No If all of the above answers are "NO", then may proceed with Cephalosporin use.   Albumin (human) Rash, Other (See Comments)   Unknown other reactions   Cephalexin Hives, Itching, Rash   Chlorpromazine Rash, Other (See Comments)   hyperactivity   Darvocet [propoxyphene N-acetaminophen] Itching, Nausea And Vomiting, Rash   Dilaudid [hydromorphone Hcl] Itching, Nausea And Vomiting, Swelling   Fentanyl Hives, Nausea And Vomiting   Hydrocodone Nausea And Vomiting   Ketorolac Tromethamine Itching, Nausea And Vomiting, Rash   Nortriptyline Hcl Itching, Rash, Other (See Comments)   REACTION: Hyperactive   Oxycodone Nausea And Vomiting   Pentazocine Nausea And Vomiting, Rash  Pentazocine-naloxone Nausea And Vomiting   Percocet [oxycodone-acetaminophen] Itching, Nausea And Vomiting, Rash   Propoxyphene Itching, Nausea And Vomiting, Rash   Sertraline  Hcl Other (See Comments)   REACTION: Hyperactive - bounce off the wall   Tramadol Hcl Nausea And Vomiting   Latex Hives   Sulfa Antibiotics    Per patient, can not take sulfa antibiotics, patient unsure her reaction but knows she "cant have it"   Codeine Rash   Hyoscyamine Sulfate Other (See Comments)   Imipramine Itching, Rash   Tramadol Rash      Medication List    STOP taking these medications   dexamethasone 1 MG tablet Commonly known as:  DECADRON     TAKE these medications   albuterol 108 (90 Base) MCG/ACT inhaler Commonly known as:  PROVENTIL HFA;VENTOLIN HFA Inhale 2 puffs into the lungs every 6 (six) hours as needed for wheezing.   aspirin EC 81 MG tablet Take 81 mg by mouth every evening.   cetirizine 10 MG tablet Commonly known as:  ZYRTEC Take 10 mg by mouth 2 (two) times daily.   cyclobenzaprine 10 MG tablet Commonly known as:  FLEXERIL Take 1 tablet (10 mg total) by mouth 2 (two) times daily as needed for muscle spasms. What changed:  when to take this   doxycycline 100 MG tablet Commonly known as:  VIBRA-TABS Take 1 tablet (100 mg total) by mouth 2 (two) times daily for 5 days.   FLUoxetine 20 MG capsule Commonly known as:  PROZAC Take 60 mg by mouth daily.   fluticasone 50 MCG/ACT nasal spray Commonly known as:  FLONASE Place 1 spray into both nostrils daily as needed for allergies or rhinitis.   gabapentin 300 MG capsule Commonly known as:  NEURONTIN Take 900-1,800 mg by mouth 3 (three) times daily. Takes 3 capsules in the morning, 3 capsules in the afternoon, and 6 capsules at bedtime.   hydrOXYzine 25 MG capsule Commonly known as:  VISTARIL Take 1 capsule by mouth 3 (three) times daily as needed for itching.   levothyroxine 75 MCG tablet Commonly known as:  SYNTHROID, LEVOTHROID Take 75 mcg by mouth daily before breakfast.   LORazepam 1 MG tablet Commonly known as:  ATIVAN Take 1 mg by mouth every 8 (eight) hours as needed for  anxiety.   meloxicam 15 MG tablet Commonly known as:  MOBIC Take 15 mg by mouth daily.   meperidine 50 MG tablet Commonly known as:  DEMEROL Take 1 tablet (50 mg total) by mouth every 6 (six) hours as needed for moderate pain. FOR PAIN What changed:  Another medication with the same name was removed. Continue taking this medication, and follow the directions you see here.   methocarbamol 500 MG tablet Commonly known as:  ROBAXIN Take 1 tablet (500 mg total) by mouth every 6 (six) hours as needed for muscle spasms.   metoprolol succinate 50 MG 24 hr tablet Commonly known as:  TOPROL-XL Take 50 mg by mouth daily. Take with or immediately following a meal.   omeprazole 20 MG capsule Commonly known as:  PRILOSEC Take 1 capsule (20 mg total) by mouth 2 (two) times daily. What changed:  when to take this   predniSONE 20 MG tablet Commonly known as:  DELTASONE Take 2 tablets (40 mg total) by mouth daily with breakfast for 5 days.   RITALIN 10 MG tablet Generic drug:  methylphenidate Take 10 mg by mouth 2 (two) times daily as needed.   valACYclovir 500  MG tablet Commonly known as:  VALTREX Take 500 mg by mouth daily as needed (for sun exposure).      Follow-up Information    Gareth Morgan, MD. Schedule an appointment as soon as possible for a visit in 1 week(s).   Specialty:  Family Medicine Contact information: [address] [place] [address] [phone]          Allergies  Allergen Reactions  . Amoxicillin Hives, Rash and Other (See Comments)    REACTION: Stomach bleeding    . Ampicillin Rash and Other (See Comments)    REACTION: Stomach bleeding  . Aspirin Other (See Comments)    REACTION: ulcer and GI bleeding. Patient states she can take the 81mg  aspirin-NO 325MG   . Chlorzoxazone Rash and Other (See Comments)    Caused pleural effusion/excessive fluid buildup  . Ciprofloxacin Anaphylaxis    REACTION: Throat swells shut  . Heparin Other (See  Comments)    MAKES HEART STOP BEATING.   . Indomethacin Other (See Comments)    MAKES HEART STOP BEATING.   . Nsaids Other (See Comments)    REACTION: Stomach bleeding  . Penicillins Hives, Shortness Of Breath and Swelling    Has patient had a PCN reaction causing immediate rash, facial/tongue/throat swelling, SOB or lightheadedness with hypotension: Yes Has patient had a PCN reaction causing severe rash involving mucus membranes or skin necrosis: Yes Has patient had a PCN reaction that required hospitalization: Unknown Has patient had a PCN reaction occurring within the last 10 years: No If all of the above answers are "NO", then may proceed with Cephalosporin use.   . Albumin (Human) Rash and Other (See Comments)    Unknown other reactions  . Cephalexin Hives, Itching and Rash  . Chlorpromazine Rash and Other (See Comments)    hyperactivity  . Darvocet [Propoxyphene N-Acetaminophen] Itching, Nausea And Vomiting and Rash  . Dilaudid [Hydromorphone Hcl] Itching, Nausea And Vomiting and Swelling  . Fentanyl Hives and Nausea And Vomiting  . Hydrocodone Nausea And Vomiting  . Ketorolac Tromethamine Itching, Nausea And Vomiting and Rash  . Nortriptyline Hcl Itching, Rash and Other (See Comments)    REACTION: Hyperactive  . Oxycodone Nausea And Vomiting  . Pentazocine Nausea And Vomiting and Rash  . Pentazocine-Naloxone Nausea And Vomiting  . Percocet [Oxycodone-Acetaminophen] Itching, Nausea And Vomiting and Rash  . Propoxyphene Itching, Nausea And Vomiting and Rash  . Sertraline Hcl Other (See Comments)    REACTION: Hyperactive - bounce off the wall  . Tramadol Hcl Nausea And Vomiting  . Latex Hives  . Sulfa Antibiotics     Per patient, can not take sulfa antibiotics, patient unsure her reaction but knows she "cant have it"  . Codeine Rash  . Hyoscyamine Sulfate Other (See Comments)  . Imipramine Itching and Rash  . Tramadol Rash        Procedures/Studies: Dg Chest 2  View  Result Date: 06/08/2017 CLINICAL DATA:  Nonproductive cough.  Congestion. EXAM: CHEST  2 VIEW COMPARISON:  No prior. FINDINGS: Mediastinum and hilar structures normal. Lungs are clear. Heart size normal. No pleural effusion or pneumothorax. Degenerative change thoracic spine. Thoracic spine fusion. IMPRESSION: No acute cardiopulmonary disease. Electronically Signed   By: Maisie Fus  Register   On: 06/08/2017 13:02       Subjective: breathing and cough much better this morning.  Discharge Exam: Vitals:   06/09/17 0500 06/09/17 0817  BP: 131/69   Pulse:    Resp: 20   Temp: 98.2  F (36.8 C)   SpO2: 98% 98%   Vitals:   06/08/17 2135 06/09/17 0220 06/09/17 0500 06/09/17 0817  BP:   131/69   Pulse:      Resp:   20   Temp:   98.2 F (36.8 C)   TempSrc:   Oral   SpO2: 98% 95% 98% 98%  Weight:      Height:        General: Middle-aged female not in distress HEENT: Moist mucosa, supple neck Chest: Few scattered rhonchi CVS: Normal S1 and S2, no murmurs GI: Soft, nondistended, nontender Musculoskeletal: Warm, no edema    The results of significant diagnostics from this hospitalization (including imaging, microbiology, ancillary and laboratory) are listed below for reference.     Microbiology: No results found for this or any previous visit (from the past 240 hour(s)).   Labs: BNP (last 3 results) Recent Labs    06/08/17 1235  BNP 17.0   Basic Metabolic Panel: Recent Labs  Lab 06/08/17 1235 06/09/17 0628  NA 136 136  K 4.1 3.9  CL 98* 99*  CO2 20* 22  GLUCOSE 115* 136*  BUN 11 21*  CREATININE 0.90 0.88  CALCIUM 9.4 9.2   Liver Function Tests: Recent Labs  Lab 06/08/17 1235  AST 36  ALT 28  ALKPHOS 96  BILITOT 0.7  PROT 8.0  ALBUMIN 4.2   No results for input(s): LIPASE, AMYLASE in the last 168 hours. No results for input(s): AMMONIA in the last 168 hours. CBC: Recent Labs  Lab 06/08/17 1235  WBC 9.8  NEUTROABS 6.0  HGB 14.0  HCT 43.8  MCV  89.0  PLT 354   Cardiac Enzymes: Recent Labs  Lab 06/08/17 1235  TROPONINI <0.03   BNP: Invalid input(s): POCBNP CBG: No results for input(s): GLUCAP in the last 168 hours. D-Dimer No results for input(s): DDIMER in the last 72 hours. Hgb A1c No results for input(s): HGBA1C in the last 72 hours. Lipid Profile No results for input(s): CHOL, HDL, LDLCALC, TRIG, CHOLHDL, LDLDIRECT in the last 72 hours. Thyroid function studies No results for input(s): TSH, T4TOTAL, T3FREE, THYROIDAB in the last 72 hours.  Invalid input(s): FREET3 Anemia work up No results for input(s): VITAMINB12, FOLATE, FERRITIN, TIBC, IRON, RETICCTPCT in the last 72 hours. Urinalysis    Component Value Date/Time   COLORURINE STRAW (A) 06/07/2016 2204   APPEARANCEUR CLEAR 06/07/2016 2204   LABSPEC 1.008 06/07/2016 2204   PHURINE 8.0 06/07/2016 2204   GLUCOSEU NEGATIVE 06/07/2016 2204   HGBUR NEGATIVE 06/07/2016 2204   BILIRUBINUR NEGATIVE 06/07/2016 2204   KETONESUR NEGATIVE 06/07/2016 2204   PROTEINUR NEGATIVE 06/07/2016 2204   UROBILINOGEN 1.0 08/29/2011 0827   NITRITE NEGATIVE 06/07/2016 2204   LEUKOCYTESUR NEGATIVE 06/07/2016 2204   Sepsis Labs Invalid input(s): PROCALCITONIN,  WBC,  LACTICIDVEN Microbiology No results found for this or any previous visit (from the past 240 hour(s)).   Time coordinating discharge: Over 30 minutes  SIGNED:   Eddie North, MD  [facility] 06/09/2017, 11:03 AM Pager   If 7PM-7AM, please contact night-coverage [email or web address] Password [email or web address]

## 2017-06-09 NOTE — Progress Notes (Signed)
Patient discharged home. Left floor in stable condition via w/c accompanied by nurse tech about 1230. Earnstine RegalAshley Emanual Lamountain, RN

## 2017-06-09 NOTE — Progress Notes (Signed)
                                  Code 44 signed by patient. I will send to medical records and have case management fill in info in the chart

## 2017-06-10 LAB — HIV ANTIBODY (ROUTINE TESTING W REFLEX): HIV Screen 4th Generation wRfx: NONREACTIVE

## 2017-08-13 ENCOUNTER — Other Ambulatory Visit (HOSPITAL_COMMUNITY): Payer: Self-pay | Admitting: Family Medicine

## 2017-08-13 DIAGNOSIS — Z1231 Encounter for screening mammogram for malignant neoplasm of breast: Secondary | ICD-10-CM

## 2017-08-19 IMAGING — US US AORTA
1 series · 14 of 21 positions shown · non-contrast
Comparison: CT 04/12/2015

CLINICAL DATA: Family history of AAA.

EXAM:
ULTRASOUND OF ABDOMINAL AORTA
TECHNIQUE: Ultrasound examination of the abdominal aorta was performed to
evaluate for abdominal aortic aneurysm.

[Series 1: us aorta · 0.24mm/px · 14 of 21 slices shown]
[im 1/21]
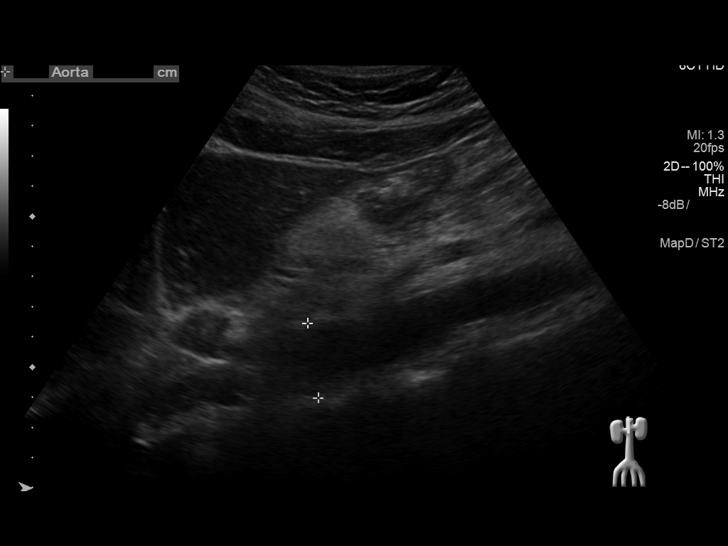
[im 3/21]
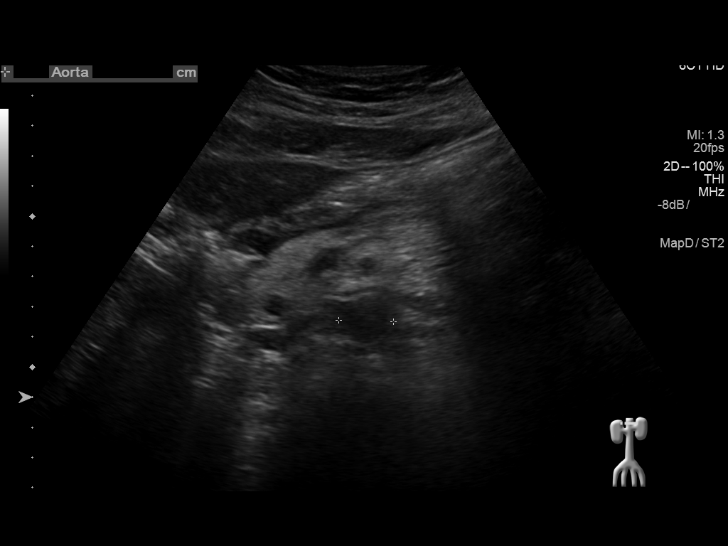
[im 4/21]
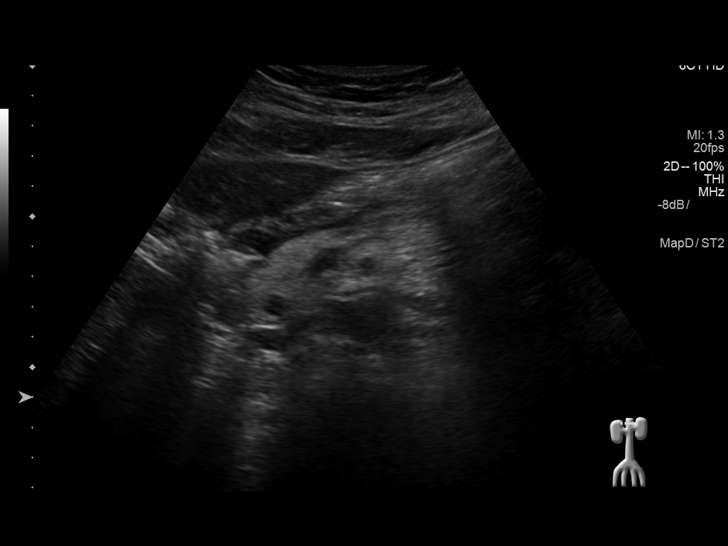
[im 6/21]
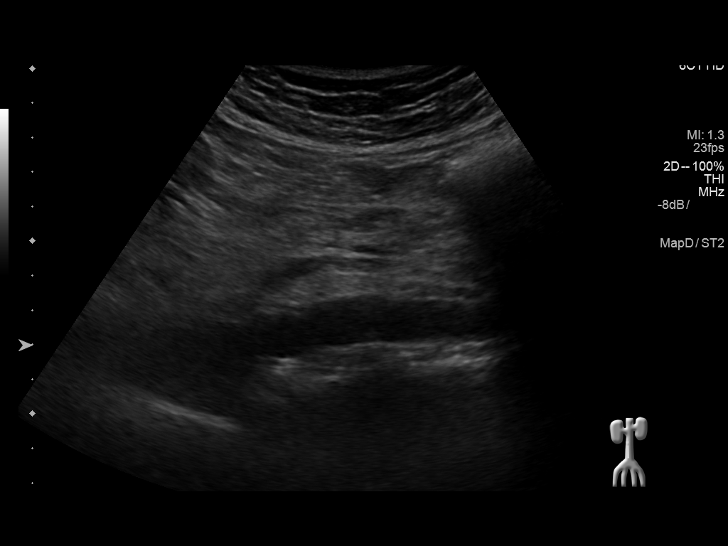
[im 7/21]
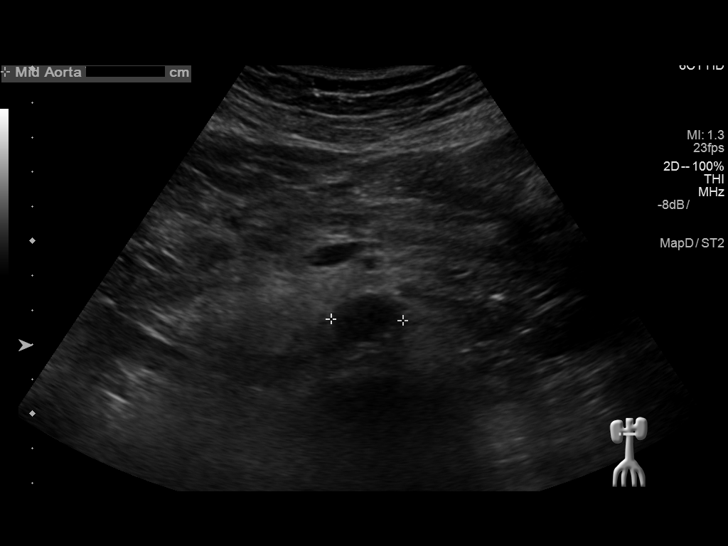
[im 9/21]
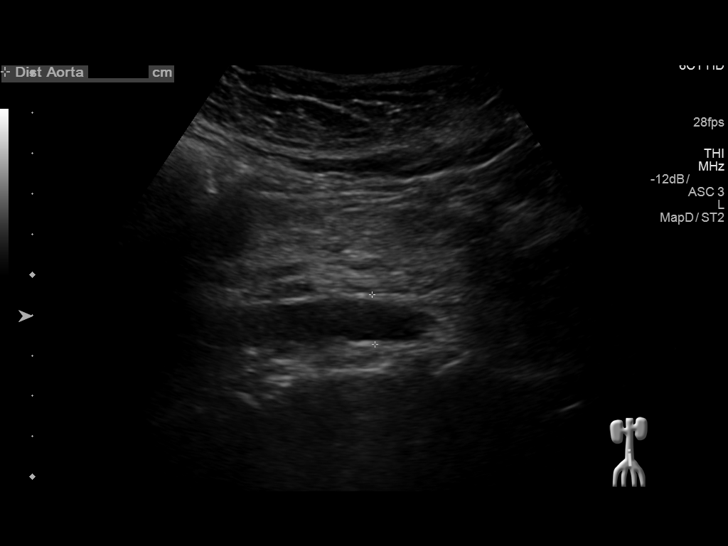
[im 10/21]
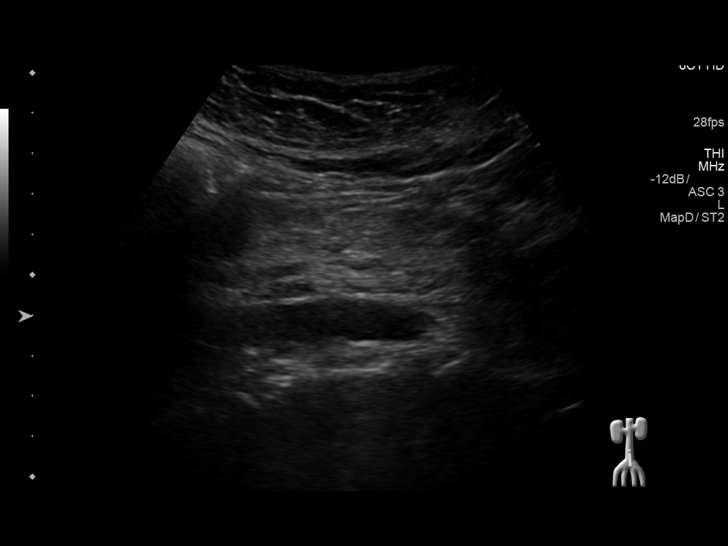
[im 12/21]
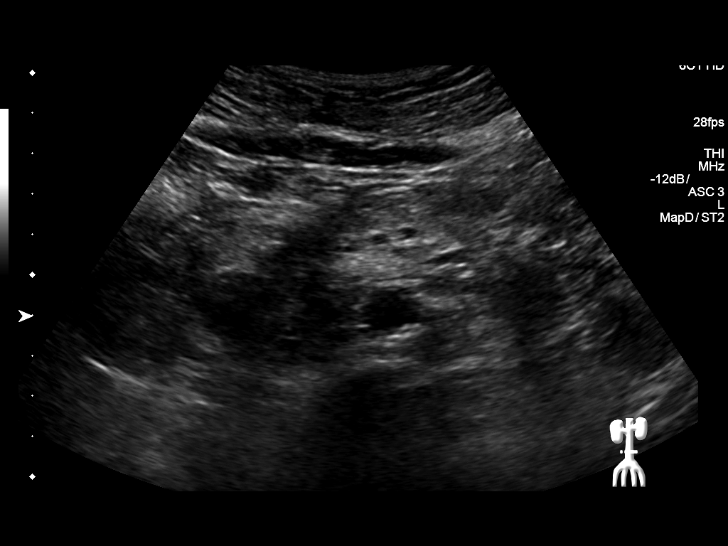
[im 13/21]
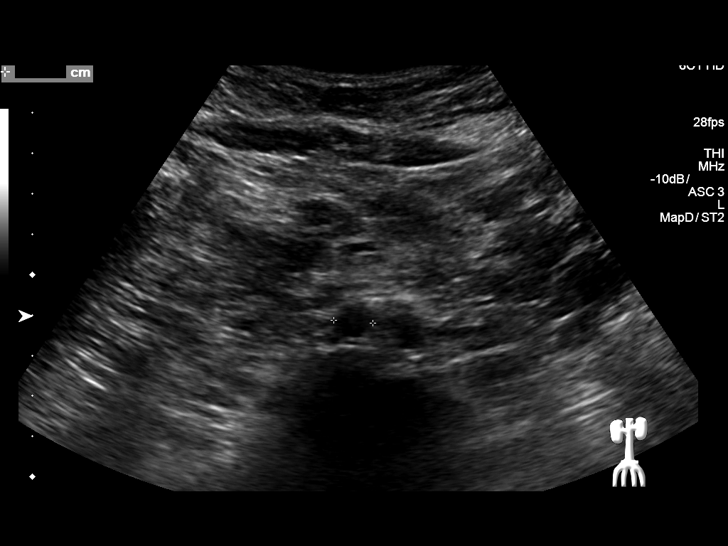
[im 15/21]
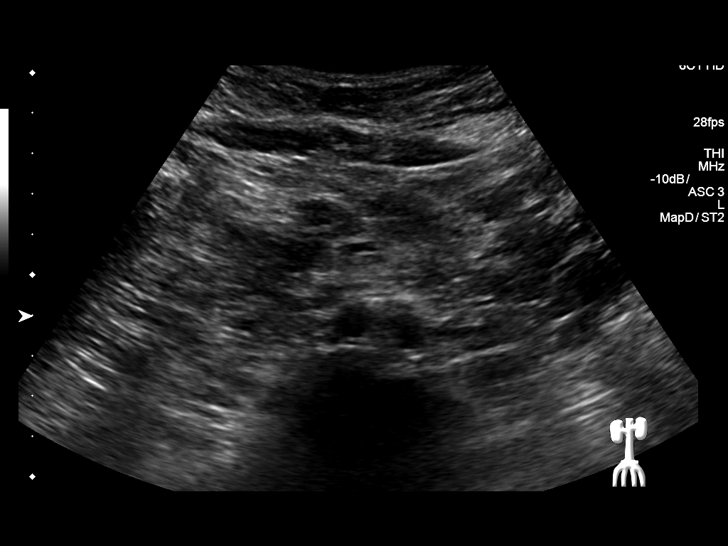
[im 16/21]
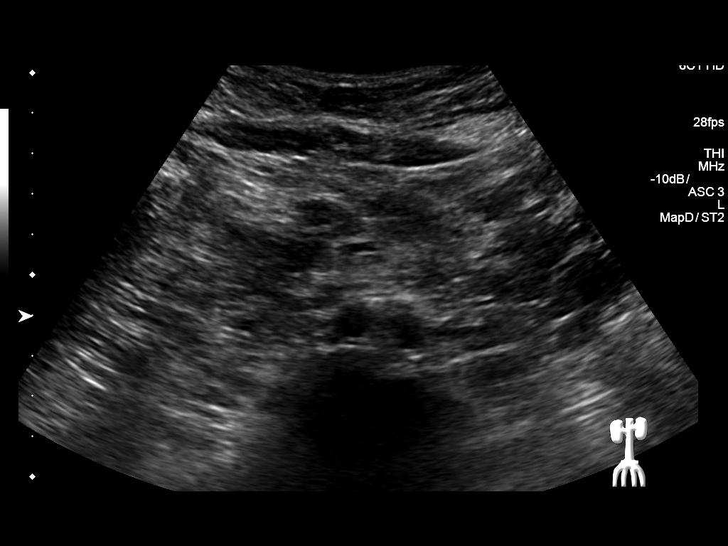
[im 18/21]
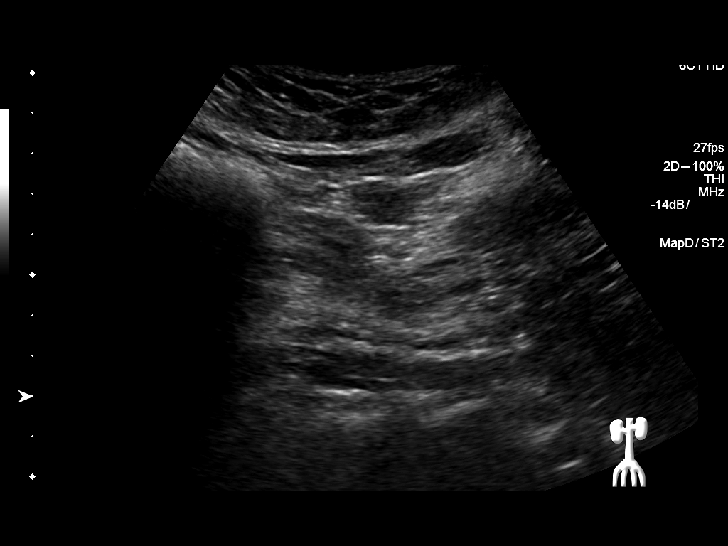
[im 19/21]
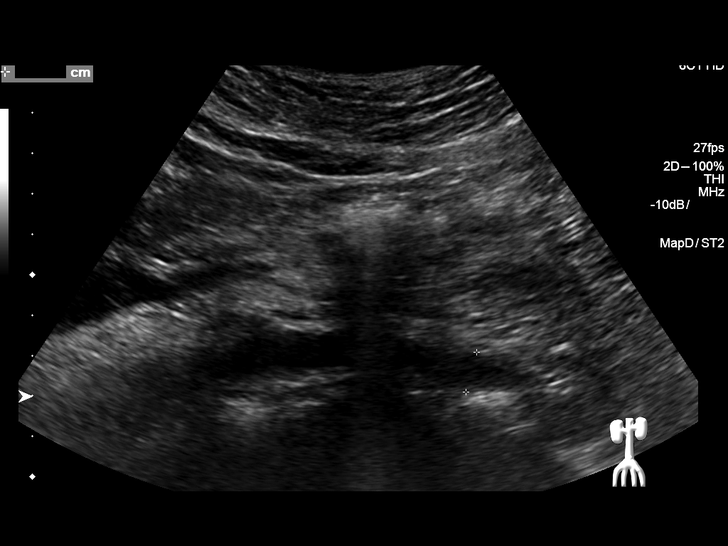
[im 21/21]
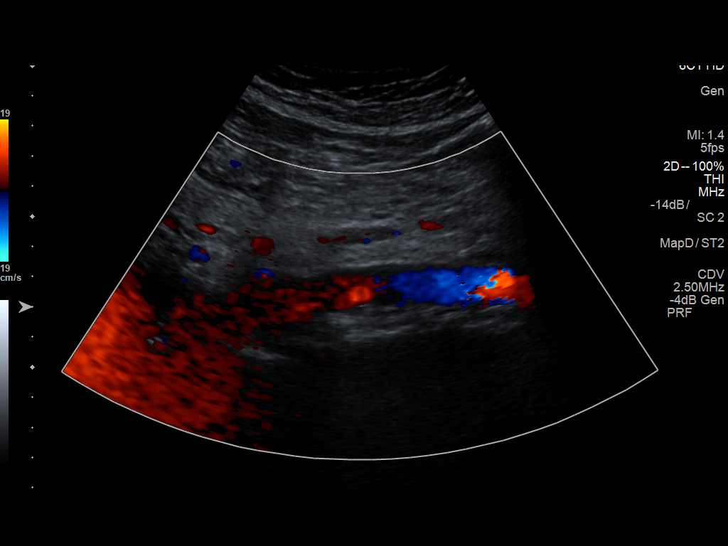

[14 of 21 positions shown; findings below may reference images not displayed]

FINDINGS: Abdominal Aorta

No aneurysm identified.

Maximum Diameter: 2.5 cm proximally.
IMPRESSION: No evidence of abdominal aortic aneurysm.

## 2017-08-22 ENCOUNTER — Ambulatory Visit (HOSPITAL_COMMUNITY)
Admission: RE | Admit: 2017-08-22 | Discharge: 2017-08-22 | Disposition: A | Discharge status: Home / Self Care | Payer: Medicare Other | Source: Ambulatory Visit | Attending: Family Medicine | Admitting: Family Medicine

## 2017-08-22 ENCOUNTER — Encounter (HOSPITAL_COMMUNITY): Payer: Self-pay

## 2017-08-22 DIAGNOSIS — Z1231 Encounter for screening mammogram for malignant neoplasm of breast: Secondary | ICD-10-CM | POA: Insufficient documentation

## 2017-09-25 ENCOUNTER — Other Ambulatory Visit (HOSPITAL_COMMUNITY): Payer: Self-pay | Admitting: Neurological Surgery

## 2017-09-25 ENCOUNTER — Other Ambulatory Visit: Payer: Self-pay | Admitting: Neurological Surgery

## 2017-09-25 DIAGNOSIS — M47816 Spondylosis without myelopathy or radiculopathy, lumbar region: Secondary | ICD-10-CM

## 2017-09-27 MED ORDER — SODIUM CHLORIDE 0.9 % IV SOLN: 4.0000 mg | Freq: Four times a day (QID) | INTRAVENOUS | Status: DC | PRN

## 2017-10-09 ENCOUNTER — Ambulatory Visit (HOSPITAL_COMMUNITY)
Admission: RE | Admit: 2017-10-09 | Discharge: 2017-10-09 | Disposition: A | Discharge status: Home / Self Care | Payer: Medicare Other | Source: Ambulatory Visit | Attending: Neurological Surgery | Admitting: Neurological Surgery

## 2017-10-09 DIAGNOSIS — M4716 Other spondylosis with myelopathy, lumbar region: Secondary | ICD-10-CM | POA: Diagnosis not present

## 2017-10-09 DIAGNOSIS — M4316 Spondylolisthesis, lumbar region: Secondary | ICD-10-CM | POA: Diagnosis not present

## 2017-10-09 DIAGNOSIS — Z981 Arthrodesis status: Secondary | ICD-10-CM | POA: Diagnosis not present

## 2017-10-09 DIAGNOSIS — M47816 Spondylosis without myelopathy or radiculopathy, lumbar region: Secondary | ICD-10-CM

## 2017-10-09 MED ORDER — IOPAMIDOL (ISOVUE-M 200) INJECTION 41%
20.0000 mL | Freq: Once | INTRAMUSCULAR | Status: AC
  Administered 2017-10-09: 10 mL via INTRATHECAL

## 2017-10-09 MED ORDER — DIAZEPAM 5 MG PO TABS
10.0000 mg | ORAL_TABLET | Freq: Once | ORAL | Status: AC
  Administered 2017-10-09: 10 mg via ORAL
  Filled 2017-10-09: qty 2

## 2017-10-09 MED ORDER — ONDANSETRON HCL 4 MG/2ML IJ SOLN: 4.0000 mg | Freq: Four times a day (QID) | INTRAMUSCULAR | Status: DC | PRN

## 2017-10-09 MED ORDER — MEPERIDINE HCL 50 MG PO TABS
50.0000 mg | ORAL_TABLET | ORAL | Status: DC | PRN
  Administered 2017-10-09: 50 mg via ORAL
  Filled 2017-10-09 (×2): qty 1

## 2017-10-09 MED ORDER — DIAZEPAM 5 MG PO TABS
ORAL_TABLET | ORAL | Status: AC
  Administered 2017-10-09: 10 mg via ORAL
  Filled 2017-10-09: qty 2

## 2017-10-09 MED ORDER — LIDOCAINE HCL (PF) 1 % IJ SOLN
INTRAMUSCULAR | Status: AC
  Administered 2017-10-09: 2 mL via INTRADERMAL
  Filled 2017-10-09: qty 5

## 2017-10-09 MED ORDER — LIDOCAINE HCL (PF) 1 % IJ SOLN
5.0000 mL | Freq: Once | INTRAMUSCULAR | Status: AC
  Administered 2017-10-09: 2 mL via INTRADERMAL

## 2017-10-09 MED ORDER — IOPAMIDOL (ISOVUE-M 200) INJECTION 41%
INTRAMUSCULAR | Status: AC
  Administered 2017-10-09: 10 mL via INTRATHECAL
  Filled 2017-10-09: qty 10

## 2017-10-09 NOTE — Discharge Instructions (Signed)
Myelogram, Care After °Refer to this sheet in the next few weeks. These instructions provide you with information about caring for yourself after your procedure. Your health care provider may also give you more specific instructions. Your treatment has been planned according to current medical practices, but problems sometimes occur. Call your health care provider if you have any problems or questions after your procedure. °What can I expect after the procedure? °After the procedure, it is common to have: °· Soreness at your injection site. °· A mild headache. ° °Follow these instructions at home: °· Drink enough fluid to keep your urine clear or pale yellow. This will help flush out the dye (contrast material) from your spine. °· Rest as told by your health care provider. Lie flat with your head slightly raised (elevated) to reduce the risk of headache. °· Do not bend, lift, or do any strenuous activity for 24-48 hours or as told by your health care provider. °· Take over-the-counter and prescription medicines only as told by your health care provider. °· Take care of and remove your bandage (dressing) as told by your health care provider. °· Bathe or shower as told by your health care provider. °Contact a health care provider if: °· You have a fever. °· You have a headache that lasts longer than 24 hours. °· You feel nauseous or vomit. °· You have a stiff neck or numbness in your legs. °· You are unable to urinate or have a bowel movement. °· You develop a rash, itching, or sneezing. °Get help right away if: °· You have new symptoms or your symptoms get worse. °· You have a seizure. °· You have trouble breathing. °This information is not intended to replace advice given to you by your health care provider. Make sure you discuss any questions you have with your health care provider. °Document Released: 05/21/2015 Document Revised: 09/30/2015 Document Reviewed: 02/04/2015 °Elsevier Interactive Patient Education ©  2018 [employer] ° °

## 2017-10-09 NOTE — Procedures (Signed)
[name] is a 62 year old individuals had a previous decompression and fusion from L3-L5.  Myelogram year ago demonstrated that she had ample decompression and good stabilization.  She is developed a new right lumbar radiculopathy.  [name] cannot tolerate MRIs very well at all secondary to severe restless leg syndrome and because of this she has been advised regarding a new myelogram and post myelogram CAT scan.  Pre op Dx: Right lumbar radiculopathy Post op Dx: Same, history of fusion L3-L5 Procedure: Lumbar myelogram Surgeon: Maesyn Frisinger Puncture level: L2-3 Fluid color: Clear colorless Injection: Isovue-200, 10 mL Findings: No obvious root cut off on myelogram, further evaluation with CT scanning.

## 2017-10-15 ENCOUNTER — Other Ambulatory Visit: Payer: Self-pay | Admitting: Neurological Surgery

## 2017-11-05 HISTORY — PX: BACK SURGERY: SHX140

## 2017-11-16 NOTE — Pre-Procedure Instructions (Signed)
LATIANA TOMEI  11/16/2017      [facility] - [place], [place] - [address] [place] [address] Phone: [phone] Fax: [phone]    Your procedure is scheduled on July 22  Report to [facility] Admitting at 5:30 A.M.  Call this number if you have problems the morning of surgery:  [phone]   Remember:   Do not eat or drink after midnight.                       Take these medicines the morning of surgery with A SIP OF WATER :                Albuterol inhaler-bring to hospital            Cetirizine (zyrtec)            Cyclobenzaprine (flexeril) if needed or methocarbamol (robaxin)            Fluoxetine (prozac)            flonase nasal spray if needed            Gabapentin (neurontin)            Levothyroxine (synthroid)            Lorazepam (ativan) if needed            Demerol if needed            Metoprolol (toprol-xl)            Omeprazole (prilosec)              7 days prior to surgery STOP taking  Aleve, Naproxen, Ibuprofen, Motrin, Advil, Goody's, BC's, all herbal medications, fish oil, and all vitamins                    Follow your doctors instructions regarding your Aspirin.  If no instructions were given by your doctor, then you will need to call the prescribing office office to get instructions.       Do not wear jewelry, make-up or nail polish.  Do not wear lotions, powders, or perfumes, or deodorant.  Do not shave 48 hours prior to surgery.  Men may shave face and neck.  Do not bring valuables to the hospital.  [facility] is not responsible for any belongings or valuables.  Contacts, dentures or bridgework may not be worn into surgery.  Leave your suitcase in the car.  After surgery it may be brought to your room.  For patients admitted to the hospital, discharge time will be determined by your treatment team.  Patients discharged the day of surgery will not be allowed to drive home.    Special  instructions:  [place]- Preparing For Surgery  Before surgery, you can play an important role. Because skin is not sterile, your skin needs to be as free of germs as possible. You can reduce the number of germs on your skin by washing with CHG (chlorahexidine gluconate) Soap before surgery.  CHG is an antiseptic cleaner which kills germs and bonds with the skin to continue killing germs even after washing.    Oral Hygiene is also important to reduce your risk of infection.  Remember - BRUSH YOUR TEETH THE MORNING OF SURGERY WITH YOUR REGULAR TOOTHPASTE  Please do not use if you have an allergy to CHG or antibacterial soaps. If your skin becomes  reddened/irritated stop using the CHG.  Do not shave (including legs and underarms) for at least 48 hours prior to first CHG shower. It is OK to shave your face.  Please follow these instructions carefully.   1. Shower the NIGHT BEFORE SURGERY and the MORNING OF SURGERY with CHG.   2. If you chose to wash your hair, wash your hair first as usual with your normal shampoo.  3. After you shampoo, rinse your hair and body thoroughly to remove the shampoo.  4. Use CHG as you would any other liquid soap. You can apply CHG directly to the skin and wash gently with a scrungie or a clean washcloth.   5. Apply the CHG Soap to your body ONLY FROM THE NECK DOWN.  Do not use on open wounds or open sores. Avoid contact with your eyes, ears, mouth and genitals (private parts). Wash Face and genitals (private parts)  with your normal soap.  6. Wash thoroughly, paying special attention to the area where your surgery will be performed.  7. Thoroughly rinse your body with warm water from the neck down.  8. DO NOT shower/wash with your normal soap after using and rinsing off the CHG Soap.  9. Pat yourself dry with a CLEAN TOWEL.  10. Wear CLEAN PAJAMAS to bed the night before surgery, wear comfortable clothes the morning of surgery  11. Place CLEAN SHEETS on  your bed the night of your first shower and DO NOT SLEEP WITH PETS.    Day of Surgery:  Do not apply any deodorants/lotions.  Please wear clean clothes to the hospital/surgery center.   Remember to brush your teeth WITH YOUR REGULAR TOOTHPASTE.    Please read over the following fact sheets that you were given. Coughing and Deep Breathing, MRSA Information and Surgical Site Infection Prevention

## 2017-11-19 ENCOUNTER — Encounter (HOSPITAL_COMMUNITY)
Admission: RE | Admit: 2017-11-19 | Discharge: 2017-11-19 | Disposition: A | Discharge status: Home / Self Care | Payer: Medicare Other | Source: Ambulatory Visit | Attending: Neurological Surgery | Admitting: Neurological Surgery

## 2017-11-19 ENCOUNTER — Other Ambulatory Visit: Payer: Self-pay

## 2017-11-19 ENCOUNTER — Encounter (HOSPITAL_COMMUNITY): Payer: Self-pay

## 2017-11-19 DIAGNOSIS — Z01812 Encounter for preprocedural laboratory examination: Secondary | ICD-10-CM | POA: Diagnosis not present

## 2017-11-19 HISTORY — DX: Unspecified fracture of unspecified lumbar vertebra, subsequent encounter for fracture with nonunion: S32.009K

## 2017-11-19 LAB — BASIC METABOLIC PANEL
Anion gap: 9 (ref 5–15)
BUN: 15 mg/dL (ref 6–20)
CHLORIDE: 101 mmol/L (ref 98–111)
CO2: 29 mmol/L (ref 22–32)
Calcium: 9.2 mg/dL (ref 8.9–10.3)
Creatinine, Ser: 1.3 mg/dL — ABNORMAL HIGH (ref 0.44–1.00)
GFR calc non Af Amer: 44 mL/min — ABNORMAL LOW (ref >60)
GFR, EST AFRICAN AMERICAN: 51 mL/min — AB (ref >60)
Glucose, Bld: 99 mg/dL (ref 70–99)
POTASSIUM: 3.3 mmol/L — AB (ref 3.5–5.1)
SODIUM: 139 mmol/L (ref 135–145)

## 2017-11-19 LAB — TYPE AND SCREEN
ABO/RH(D): A POS
Antibody Screen: NEGATIVE

## 2017-11-19 LAB — CBC
HCT: 43.1 % (ref 36.0–46.0)
Hemoglobin: 13.9 g/dL (ref 12.0–15.0)
MCH: 29.1 pg (ref 26.0–34.0)
MCHC: 32.3 g/dL (ref 30.0–36.0)
MCV: 90.2 fL (ref 78.0–100.0)
PLATELETS: 364 10*3/uL (ref 150–400)
RBC: 4.78 MIL/uL (ref 3.87–5.11)
RDW: 12.5 % (ref 11.5–15.5)
WBC: 10.9 10*3/uL — ABNORMAL HIGH (ref 4.0–10.5)

## 2017-11-19 LAB — SURGICAL PCR SCREEN
MRSA, PCR: NEGATIVE
STAPHYLOCOCCUS AUREUS: NEGATIVE

## 2017-11-19 NOTE — Progress Notes (Signed)
PCP - Dr. Littie DeedsSteven Savage  Cardiologist - Denies  Chest x-ray - 06/08/17 (E)  EKG - 06/08/17 (E)  Stress Test - 11/14 (E)  ECHO - 1/03 (E)  Cardiac Cath - Denies  Sleep Study - Denies CPAP - None  LABS- 11/19/17: CBC, BMP, T/S  ASA- LD- 6/2   Anesthesia- No  Pt denies having chest pain, sob, or fever at this time. All instructions explained to the pt, with a verbal understanding of the material. Pt agrees to go over the instructions while at home for a better understanding. The opportunity to ask questions was provided.

## 2017-11-25 NOTE — Anesthesia Preprocedure Evaluation (Addendum)
Anesthesia Evaluation  Patient identified by MRN, date of birth, ID band Patient awake    Reviewed: Allergy & Precautions, H&P , NPO status , Patient's Chart, lab work & pertinent test results  Airway Mallampati: III  TM Distance: >3 FB Neck ROM: Full    Dental  (+) Dental Advisory Given, Edentulous Upper, Edentulous Lower   Pulmonary shortness of breath, asthma , pneumonia,    breath sounds clear to auscultation       Cardiovascular Normal cardiovascular exam+ dysrhythmias  Rhythm:Regular     Neuro/Psych  Headaches, Seizures -,  PSYCHIATRIC DISORDERS (suicide attempt) Anxiety Depression TIA Neuromuscular disease CVA    GI/Hepatic PUD, GERD  Medicated and Controlled,  Endo/Other  Hypothyroidism   Renal/GU   negative genitourinary   Musculoskeletal  (+) Arthritis , Fibromyalgia -  Abdominal (+) + obese,   Peds  Hematology   Anesthesia Other Findings   Reproductive/Obstetrics negative OB ROS                            Anesthesia Physical Anesthesia Plan  ASA: III  Anesthesia Plan: General   Post-op Pain Management:    Induction: Intravenous  PONV Risk Score and Plan: 4 or greater and Ondansetron, Dexamethasone, Treatment may vary due to age or medical condition and Midazolam  Airway Management Planned: Oral ETT  Additional Equipment: Arterial line  Intra-op Plan:   Post-operative Plan: Extubation in OR  Informed Consent: I have reviewed the patients History and Physical, chart, labs and discussed the procedure including the risks, benefits and alternatives for the proposed anesthesia with the patient or authorized representative who has indicated his/her understanding and acceptance.   Dental advisory given  Plan Discussed with: CRNA  Anesthesia Plan Comments:        Anesthesia Quick Evaluation

## 2017-11-26 ENCOUNTER — Inpatient Hospital Stay (HOSPITAL_COMMUNITY)
Admission: RE | Admit: 2017-11-26 | Discharge: 2017-11-28 | DRG: 460 | Disposition: A | Discharge status: Home / Self Care | Payer: Medicare Other | Attending: Neurological Surgery | Admitting: Neurological Surgery

## 2017-11-26 ENCOUNTER — Other Ambulatory Visit: Payer: Self-pay

## 2017-11-26 ENCOUNTER — Encounter (HOSPITAL_COMMUNITY)
Admission: RE | Disposition: A | Discharge status: Home / Self Care | Payer: Self-pay | Source: Home / Self Care | Attending: Neurological Surgery

## 2017-11-26 ENCOUNTER — Inpatient Hospital Stay (HOSPITAL_COMMUNITY): Payer: Medicare Other

## 2017-11-26 ENCOUNTER — Inpatient Hospital Stay (HOSPITAL_COMMUNITY): Payer: Medicare Other | Admitting: Certified Registered"

## 2017-11-26 ENCOUNTER — Encounter (HOSPITAL_COMMUNITY): Payer: Self-pay | Admitting: *Deleted

## 2017-11-26 DIAGNOSIS — Z880 Allergy status to penicillin: Secondary | ICD-10-CM | POA: Diagnosis not present

## 2017-11-26 DIAGNOSIS — Z884 Allergy status to anesthetic agent status: Secondary | ICD-10-CM | POA: Diagnosis not present

## 2017-11-26 DIAGNOSIS — Z791 Long term (current) use of non-steroidal anti-inflammatories (NSAID): Secondary | ICD-10-CM | POA: Diagnosis not present

## 2017-11-26 DIAGNOSIS — Z881 Allergy status to other antibiotic agents status: Secondary | ICD-10-CM | POA: Diagnosis not present

## 2017-11-26 DIAGNOSIS — Z8673 Personal history of transient ischemic attack (TIA), and cerebral infarction without residual deficits: Secondary | ICD-10-CM

## 2017-11-26 DIAGNOSIS — Z882 Allergy status to sulfonamides status: Secondary | ICD-10-CM

## 2017-11-26 DIAGNOSIS — Z6836 Body mass index (BMI) 36.0-36.9, adult: Secondary | ICD-10-CM | POA: Diagnosis not present

## 2017-11-26 DIAGNOSIS — F419 Anxiety disorder, unspecified: Secondary | ICD-10-CM | POA: Diagnosis present

## 2017-11-26 DIAGNOSIS — Z91040 Latex allergy status: Secondary | ICD-10-CM | POA: Diagnosis not present

## 2017-11-26 DIAGNOSIS — M960 Pseudarthrosis after fusion or arthrodesis: Principal | ICD-10-CM | POA: Diagnosis present

## 2017-11-26 DIAGNOSIS — Z888 Allergy status to other drugs, medicaments and biological substances status: Secondary | ICD-10-CM

## 2017-11-26 DIAGNOSIS — Z886 Allergy status to analgesic agent status: Secondary | ICD-10-CM

## 2017-11-26 DIAGNOSIS — K219 Gastro-esophageal reflux disease without esophagitis: Secondary | ICD-10-CM | POA: Diagnosis present

## 2017-11-26 DIAGNOSIS — J45909 Unspecified asthma, uncomplicated: Secondary | ICD-10-CM | POA: Diagnosis present

## 2017-11-26 DIAGNOSIS — F329 Major depressive disorder, single episode, unspecified: Secondary | ICD-10-CM | POA: Diagnosis present

## 2017-11-26 DIAGNOSIS — Z79890 Hormone replacement therapy (postmenopausal): Secondary | ICD-10-CM

## 2017-11-26 DIAGNOSIS — E669 Obesity, unspecified: Secondary | ICD-10-CM | POA: Diagnosis present

## 2017-11-26 DIAGNOSIS — E039 Hypothyroidism, unspecified: Secondary | ICD-10-CM | POA: Diagnosis present

## 2017-11-26 DIAGNOSIS — M545 Low back pain: Secondary | ICD-10-CM | POA: Diagnosis present

## 2017-11-26 DIAGNOSIS — Z91030 Bee allergy status: Secondary | ICD-10-CM

## 2017-11-26 DIAGNOSIS — Z7982 Long term (current) use of aspirin: Secondary | ICD-10-CM | POA: Diagnosis not present

## 2017-11-26 DIAGNOSIS — Z419 Encounter for procedure for purposes other than remedying health state, unspecified: Secondary | ICD-10-CM

## 2017-11-26 DIAGNOSIS — S32009K Unspecified fracture of unspecified lumbar vertebra, subsequent encounter for fracture with nonunion: Secondary | ICD-10-CM | POA: Diagnosis present

## 2017-11-26 DIAGNOSIS — Z885 Allergy status to narcotic agent status: Secondary | ICD-10-CM

## 2017-11-26 SURGERY — POSTERIOR LUMBAR FUSION 2 LEVEL
Anesthesia: General | Site: Back

## 2017-11-26 MED ORDER — SUGAMMADEX SODIUM 200 MG/2ML IV SOLN
INTRAVENOUS | Status: AC
  Filled 2017-11-26: qty 2

## 2017-11-26 MED ORDER — SODIUM CHLORIDE 0.9 % IJ SOLN
INTRAMUSCULAR | Status: AC
  Filled 2017-11-26: qty 10

## 2017-11-26 MED ORDER — CEFAZOLIN SODIUM-DEXTROSE 2-4 GM/100ML-% IV SOLN: 2.0000 g | Freq: Three times a day (TID) | INTRAVENOUS | Status: DC

## 2017-11-26 MED ORDER — DIAZEPAM 5 MG PO TABS
10.0000 mg | ORAL_TABLET | Freq: Every day | ORAL | Status: DC
  Administered 2017-11-27 (×2): 10 mg via ORAL
  Filled 2017-11-26 (×3): qty 2

## 2017-11-26 MED ORDER — ONDANSETRON HCL 4 MG/2ML IJ SOLN: 4.0000 mg | Freq: Four times a day (QID) | INTRAMUSCULAR | Status: DC | PRN

## 2017-11-26 MED ORDER — SODIUM CHLORIDE 0.9% FLUSH: 3.0000 mL | INTRAVENOUS | Status: DC | PRN

## 2017-11-26 MED ORDER — 0.9 % SODIUM CHLORIDE (POUR BTL) OPTIME
TOPICAL | Status: DC | PRN
  Administered 2017-11-26: 1000 mL

## 2017-11-26 MED ORDER — VANCOMYCIN HCL IN DEXTROSE 1-5 GM/200ML-% IV SOLN
1000.0000 mg | INTRAVENOUS | Status: AC
  Administered 2017-11-26: 1000 mg via INTRAVENOUS

## 2017-11-26 MED ORDER — ALBUTEROL SULFATE (2.5 MG/3ML) 0.083% IN NEBU: 2.5000 mg | INHALATION_SOLUTION | Freq: Four times a day (QID) | RESPIRATORY_TRACT | Status: DC | PRN

## 2017-11-26 MED ORDER — MENTHOL 3 MG MT LOZG: 1.0000 | LOZENGE | OROMUCOSAL | Status: DC | PRN

## 2017-11-26 MED ORDER — DEXAMETHASONE SODIUM PHOSPHATE 10 MG/ML IJ SOLN
INTRAMUSCULAR | Status: DC | PRN
  Administered 2017-11-26: 10 mg via INTRAVENOUS

## 2017-11-26 MED ORDER — METOPROLOL SUCCINATE ER 25 MG PO TB24
ORAL_TABLET | ORAL | Status: AC
  Filled 2017-11-26: qty 2

## 2017-11-26 MED ORDER — EPHEDRINE SULFATE 50 MG/ML IJ SOLN
INTRAMUSCULAR | Status: DC | PRN
  Administered 2017-11-26: 10 mg via INTRAVENOUS

## 2017-11-26 MED ORDER — VALACYCLOVIR HCL 500 MG PO TABS: 500.0000 mg | ORAL_TABLET | Freq: Every day | ORAL | Status: DC | PRN

## 2017-11-26 MED ORDER — LORATADINE 10 MG PO TABS
10.0000 mg | ORAL_TABLET | Freq: Every day | ORAL | Status: DC
  Administered 2017-11-26 – 2017-11-28 (×3): 10 mg via ORAL
  Filled 2017-11-26 (×3): qty 1

## 2017-11-26 MED ORDER — MORPHINE SULFATE (PF) 2 MG/ML IV SOLN
2.0000 mg | INTRAVENOUS | Status: DC | PRN
  Administered 2017-11-26 (×2): 2 mg via INTRAVENOUS
  Filled 2017-11-26 (×2): qty 1

## 2017-11-26 MED ORDER — FLUTICASONE PROPIONATE 50 MCG/ACT NA SUSP: 1.0000 | Freq: Every day | NASAL | Status: DC | PRN

## 2017-11-26 MED ORDER — METOPROLOL SUCCINATE ER 25 MG PO TB24
50.0000 mg | ORAL_TABLET | Freq: Once | ORAL | Status: AC
  Administered 2017-11-26: 50 mg via ORAL

## 2017-11-26 MED ORDER — SUFENTANIL CITRATE 50 MCG/ML IV SOLN
INTRAVENOUS | Status: DC | PRN
  Administered 2017-11-26: 15 ug via INTRAVENOUS
  Administered 2017-11-26: 10 ug via INTRAVENOUS

## 2017-11-26 MED ORDER — ONDANSETRON HCL 4 MG/2ML IJ SOLN
INTRAMUSCULAR | Status: AC
  Filled 2017-11-26: qty 2

## 2017-11-26 MED ORDER — BUPIVACAINE HCL (PF) 0.5 % IJ SOLN
INTRAMUSCULAR | Status: DC | PRN
  Administered 2017-11-26: 10 mL

## 2017-11-26 MED ORDER — ACETAMINOPHEN 10 MG/ML IV SOLN
INTRAVENOUS | Status: AC
  Administered 2017-11-26: 1000 mg via INTRAVENOUS
  Filled 2017-11-26: qty 100

## 2017-11-26 MED ORDER — POLYETHYLENE GLYCOL 3350 17 G PO PACK: 17.0000 g | PACK | Freq: Every day | ORAL | Status: DC | PRN

## 2017-11-26 MED ORDER — ONDANSETRON HCL 4 MG/2ML IJ SOLN
INTRAMUSCULAR | Status: DC | PRN
  Administered 2017-11-26: 4 mg via INTRAVENOUS

## 2017-11-26 MED ORDER — LIDOCAINE-EPINEPHRINE 1 %-1:100000 IJ SOLN
INTRAMUSCULAR | Status: AC
  Filled 2017-11-26: qty 1

## 2017-11-26 MED ORDER — DIPHENHYDRAMINE HCL 50 MG/ML IJ SOLN
12.5000 mg | Freq: Once | INTRAMUSCULAR | Status: AC
  Administered 2017-11-26: 12.5 mg via INTRAVENOUS

## 2017-11-26 MED ORDER — DOCUSATE SODIUM 100 MG PO CAPS
100.0000 mg | ORAL_CAPSULE | Freq: Two times a day (BID) | ORAL | Status: DC
  Filled 2017-11-26 (×3): qty 1

## 2017-11-26 MED ORDER — PHENYLEPHRINE 40 MCG/ML (10ML) SYRINGE FOR IV PUSH (FOR BLOOD PRESSURE SUPPORT)
PREFILLED_SYRINGE | INTRAVENOUS | Status: DC | PRN
  Administered 2017-11-26: 120 ug via INTRAVENOUS
  Administered 2017-11-26: 200 ug via INTRAVENOUS
  Administered 2017-11-26: 80 ug via INTRAVENOUS

## 2017-11-26 MED ORDER — SENNA 8.6 MG PO TABS
1.0000 | ORAL_TABLET | Freq: Two times a day (BID) | ORAL | Status: DC
  Filled 2017-11-26 (×2): qty 1

## 2017-11-26 MED ORDER — ROCURONIUM BROMIDE 10 MG/ML (PF) SYRINGE
PREFILLED_SYRINGE | INTRAVENOUS | Status: DC | PRN
  Administered 2017-11-26: 10 mg via INTRAVENOUS
  Administered 2017-11-26: 50 mg via INTRAVENOUS

## 2017-11-26 MED ORDER — ONDANSETRON HCL 4 MG PO TABS: 4.0000 mg | ORAL_TABLET | Freq: Four times a day (QID) | ORAL | Status: DC | PRN

## 2017-11-26 MED ORDER — MIDAZOLAM HCL 2 MG/2ML IJ SOLN
INTRAMUSCULAR | Status: AC
  Filled 2017-11-26: qty 2

## 2017-11-26 MED ORDER — LEVOTHYROXINE SODIUM 75 MCG PO TABS
75.0000 ug | ORAL_TABLET | Freq: Every day | ORAL | Status: DC
  Administered 2017-11-27 – 2017-11-28 (×2): 75 ug via ORAL
  Filled 2017-11-26 (×2): qty 1

## 2017-11-26 MED ORDER — METOPROLOL SUCCINATE ER 50 MG PO TB24
50.0000 mg | ORAL_TABLET | Freq: Every day | ORAL | Status: DC
  Filled 2017-11-26 (×2): qty 1

## 2017-11-26 MED ORDER — PANTOPRAZOLE SODIUM 40 MG PO TBEC
40.0000 mg | DELAYED_RELEASE_TABLET | Freq: Every day | ORAL | Status: DC
  Administered 2017-11-26 – 2017-11-28 (×3): 40 mg via ORAL
  Filled 2017-11-26 (×3): qty 1

## 2017-11-26 MED ORDER — METHYLPHENIDATE HCL 10 MG PO TABS: 10.0000 mg | ORAL_TABLET | Freq: Two times a day (BID) | ORAL | Status: DC | PRN

## 2017-11-26 MED ORDER — GABAPENTIN 400 MG PO CAPS
1800.0000 mg | ORAL_CAPSULE | Freq: Every day | ORAL | Status: DC
  Administered 2017-11-26 – 2017-11-27 (×2): 1800 mg via ORAL
  Filled 2017-11-26 (×2): qty 3

## 2017-11-26 MED ORDER — FENTANYL CITRATE (PF) 100 MCG/2ML IJ SOLN: 25.0000 ug | INTRAMUSCULAR | Status: DC | PRN

## 2017-11-26 MED ORDER — PROPOFOL 10 MG/ML IV BOLUS
INTRAVENOUS | Status: AC
  Filled 2017-11-26: qty 20

## 2017-11-26 MED ORDER — KETOROLAC TROMETHAMINE 15 MG/ML IJ SOLN
7.5000 mg | Freq: Four times a day (QID) | INTRAMUSCULAR | Status: DC
  Administered 2017-11-26: 7.5 mg via INTRAVENOUS

## 2017-11-26 MED ORDER — SUFENTANIL CITRATE 50 MCG/ML IV SOLN
INTRAVENOUS | Status: AC
  Filled 2017-11-26: qty 1

## 2017-11-26 MED ORDER — LACTATED RINGERS IV SOLN
INTRAVENOUS | Status: DC
  Administered 2017-11-26 (×2): via INTRAVENOUS

## 2017-11-26 MED ORDER — HEMOSTATIC AGENTS (NO CHARGE) OPTIME
TOPICAL | Status: DC | PRN
  Administered 2017-11-26: 1 via TOPICAL

## 2017-11-26 MED ORDER — LIDOCAINE-EPINEPHRINE 1 %-1:100000 IJ SOLN
INTRAMUSCULAR | Status: DC | PRN
  Administered 2017-11-26: 10 mL

## 2017-11-26 MED ORDER — CHLORHEXIDINE GLUCONATE CLOTH 2 % EX PADS: 6.0000 | MEDICATED_PAD | Freq: Once | CUTANEOUS | Status: DC

## 2017-11-26 MED ORDER — ALUM & MAG HYDROXIDE-SIMETH 200-200-20 MG/5ML PO SUSP: 30.0000 mL | Freq: Four times a day (QID) | ORAL | Status: DC | PRN

## 2017-11-26 MED ORDER — CYCLOBENZAPRINE HCL 10 MG PO TABS
10.0000 mg | ORAL_TABLET | Freq: Three times a day (TID) | ORAL | Status: DC | PRN
  Administered 2017-11-26 – 2017-11-28 (×6): 10 mg via ORAL
  Filled 2017-11-26 (×6): qty 1

## 2017-11-26 MED ORDER — THROMBIN 5000 UNITS EX SOLR
CUTANEOUS | Status: AC
  Filled 2017-11-26: qty 20000

## 2017-11-26 MED ORDER — PROMETHAZINE HCL 25 MG/ML IJ SOLN
INTRAMUSCULAR | Status: AC
  Administered 2017-11-26: 12.5 mg via INTRAVENOUS
  Filled 2017-11-26: qty 1

## 2017-11-26 MED ORDER — DEXAMETHASONE SODIUM PHOSPHATE 10 MG/ML IJ SOLN
INTRAMUSCULAR | Status: AC
  Filled 2017-11-26: qty 1

## 2017-11-26 MED ORDER — FLEET ENEMA 7-19 GM/118ML RE ENEM: 1.0000 | ENEMA | Freq: Once | RECTAL | Status: DC | PRN

## 2017-11-26 MED ORDER — SODIUM CHLORIDE 0.9 % IV SOLN
INTRAVENOUS | Status: DC | PRN
  Administered 2017-11-26: 50 ug/min via INTRAVENOUS

## 2017-11-26 MED ORDER — GABAPENTIN 300 MG PO CAPS: 900.0000 mg | ORAL_CAPSULE | ORAL | Status: DC

## 2017-11-26 MED ORDER — ROPINIROLE HCL 1 MG PO TABS
5.0000 mg | ORAL_TABLET | Freq: Every evening | ORAL | Status: DC
  Administered 2017-11-26 – 2017-11-27 (×2): 5 mg via ORAL
  Filled 2017-11-26 (×2): qty 5

## 2017-11-26 MED ORDER — LORAZEPAM 0.5 MG PO TABS: 1.0000 mg | ORAL_TABLET | Freq: Three times a day (TID) | ORAL | Status: DC | PRN

## 2017-11-26 MED ORDER — EPHEDRINE SULFATE 50 MG/ML IJ SOLN
INTRAMUSCULAR | Status: AC
  Filled 2017-11-26: qty 1

## 2017-11-26 MED ORDER — KETOROLAC TROMETHAMINE 15 MG/ML IJ SOLN
INTRAMUSCULAR | Status: AC
  Administered 2017-11-26: 7.5 mg via INTRAVENOUS
  Filled 2017-11-26: qty 1

## 2017-11-26 MED ORDER — MEPERIDINE HCL 50 MG PO TABS
50.0000 mg | ORAL_TABLET | Freq: Four times a day (QID) | ORAL | Status: DC | PRN
  Administered 2017-11-26 – 2017-11-27 (×5): 50 mg via ORAL
  Filled 2017-11-26 (×5): qty 1

## 2017-11-26 MED ORDER — SODIUM CHLORIDE 0.9% FLUSH
3.0000 mL | Freq: Two times a day (BID) | INTRAVENOUS | Status: DC
  Administered 2017-11-26: 3 mL via INTRAVENOUS

## 2017-11-26 MED ORDER — PROMETHAZINE HCL 25 MG/ML IJ SOLN
6.2500 mg | INTRAMUSCULAR | Status: DC | PRN
  Administered 2017-11-26: 12.5 mg via INTRAVENOUS

## 2017-11-26 MED ORDER — PROPOFOL 10 MG/ML IV BOLUS
INTRAVENOUS | Status: DC | PRN
  Administered 2017-11-26: 10 mg via INTRAVENOUS
  Administered 2017-11-26 (×2): 20 mg via INTRAVENOUS
  Administered 2017-11-26: 150 mg via INTRAVENOUS
  Administered 2017-11-26: 15 mg via INTRAVENOUS

## 2017-11-26 MED ORDER — THROMBIN 5000 UNITS EX SOLR
CUTANEOUS | Status: DC | PRN
  Administered 2017-11-26 (×4): 5000 [IU] via TOPICAL

## 2017-11-26 MED ORDER — MEPERIDINE HCL 50 MG/ML IJ SOLN: 6.2500 mg | INTRAMUSCULAR | Status: DC | PRN

## 2017-11-26 MED ORDER — ACETAMINOPHEN 325 MG PO TABS
650.0000 mg | ORAL_TABLET | ORAL | Status: DC | PRN
  Filled 2017-11-26: qty 2

## 2017-11-26 MED ORDER — HYDROXYZINE HCL 50 MG PO TABS
50.0000 mg | ORAL_TABLET | Freq: Every evening | ORAL | Status: DC
  Administered 2017-11-26 – 2017-11-27 (×2): 50 mg via ORAL
  Filled 2017-11-26 (×3): qty 1

## 2017-11-26 MED ORDER — BISACODYL 10 MG RE SUPP: 10.0000 mg | Freq: Every day | RECTAL | Status: DC | PRN

## 2017-11-26 MED ORDER — BUPIVACAINE HCL (PF) 0.5 % IJ SOLN
INTRAMUSCULAR | Status: AC
  Filled 2017-11-26: qty 30

## 2017-11-26 MED ORDER — SODIUM CHLORIDE 0.9 % IV SOLN
INTRAVENOUS | Status: DC | PRN
  Administered 2017-11-26: 07:00:00

## 2017-11-26 MED ORDER — PHENOL 1.4 % MT LIQD: 1.0000 | OROMUCOSAL | Status: DC | PRN

## 2017-11-26 MED ORDER — ACETAMINOPHEN 650 MG RE SUPP: 650.0000 mg | RECTAL | Status: DC | PRN

## 2017-11-26 MED ORDER — PHENYLEPHRINE 40 MCG/ML (10ML) SYRINGE FOR IV PUSH (FOR BLOOD PRESSURE SUPPORT)
PREFILLED_SYRINGE | INTRAVENOUS | Status: AC
  Filled 2017-11-26: qty 10

## 2017-11-26 MED ORDER — FENTANYL CITRATE (PF) 100 MCG/2ML IJ SOLN
INTRAMUSCULAR | Status: AC
  Filled 2017-11-26: qty 2

## 2017-11-26 MED ORDER — METHOCARBAMOL 500 MG PO TABS
500.0000 mg | ORAL_TABLET | Freq: Four times a day (QID) | ORAL | Status: DC | PRN
  Administered 2017-11-27 (×2): 500 mg via ORAL
  Filled 2017-11-26 (×2): qty 1

## 2017-11-26 MED ORDER — DIPHENHYDRAMINE HCL 50 MG/ML IJ SOLN
INTRAMUSCULAR | Status: AC
  Administered 2017-11-26: 12.5 mg via INTRAVENOUS
  Filled 2017-11-26: qty 1

## 2017-11-26 MED ORDER — LIDOCAINE 2% (20 MG/ML) 5 ML SYRINGE
INTRAMUSCULAR | Status: DC | PRN
  Administered 2017-11-26: 100 mg via INTRAVENOUS

## 2017-11-26 MED ORDER — ROCURONIUM BROMIDE 10 MG/ML (PF) SYRINGE
PREFILLED_SYRINGE | INTRAVENOUS | Status: AC
  Filled 2017-11-26: qty 10

## 2017-11-26 MED ORDER — FLUOXETINE HCL 20 MG PO CAPS
60.0000 mg | ORAL_CAPSULE | Freq: Every day | ORAL | Status: DC
  Administered 2017-11-26 – 2017-11-28 (×3): 60 mg via ORAL
  Filled 2017-11-26 (×3): qty 3

## 2017-11-26 MED ORDER — ACETAMINOPHEN 10 MG/ML IV SOLN
1000.0000 mg | Freq: Once | INTRAVENOUS | Status: AC
  Administered 2017-11-26: 1000 mg via INTRAVENOUS

## 2017-11-26 MED ORDER — LIDOCAINE 2% (20 MG/ML) 5 ML SYRINGE
INTRAMUSCULAR | Status: AC
  Filled 2017-11-26: qty 5

## 2017-11-26 MED ORDER — SODIUM CHLORIDE 0.9 % IV SOLN: 250.0000 mL | INTRAVENOUS | Status: DC

## 2017-11-26 MED ORDER — MIDAZOLAM HCL 5 MG/5ML IJ SOLN
INTRAMUSCULAR | Status: DC | PRN
  Administered 2017-11-26: 2 mg via INTRAVENOUS

## 2017-11-26 MED ORDER — LACTATED RINGERS IV SOLN
INTRAVENOUS | Status: DC
  Administered 2017-11-26: 12:00:00 via INTRAVENOUS

## 2017-11-26 SURGICAL SUPPLY — 72 items
ADH SKN CLS APL DERMABOND .7 (GAUZE/BANDAGES/DRESSINGS) ×1
APL SRG 60D 8 XTD TIP BNDBL (TIP)
BAG DECANTER FOR FLEXI CONT (MISCELLANEOUS) ×2 IMPLANT
BASKET BONE COLLECTION (BASKET) ×2 IMPLANT
BLADE CLIPPER SURG (BLADE) IMPLANT
BONE CANC CHIPS 20CC PCAN1/4 (Bone Implant) ×2 IMPLANT
BUR MATCHSTICK NEURO 3.0 LAGG (BURR) ×2 IMPLANT
CANISTER SUCT 3000ML PPV (MISCELLANEOUS) ×2 IMPLANT
CHIPS CANC BONE 20CC PCAN1/4 (Bone Implant) ×1 IMPLANT
CONT SPEC 4OZ CLIKSEAL STRL BL (MISCELLANEOUS) ×2 IMPLANT
COVER BACK TABLE 60X90IN (DRAPES) ×2 IMPLANT
DECANTER SPIKE VIAL GLASS SM (MISCELLANEOUS) ×2 IMPLANT
DERMABOND ADVANCED (GAUZE/BANDAGES/DRESSINGS) ×1
DERMABOND ADVANCED .7 DNX12 (GAUZE/BANDAGES/DRESSINGS) ×1 IMPLANT
DEVICE DISSECT PLASMABLAD 3.0S (MISCELLANEOUS) ×1 IMPLANT
DRAPE C-ARM 42X72 X-RAY (DRAPES) ×3 IMPLANT
DRAPE C-ARMOR (DRAPES) ×1 IMPLANT
DRAPE HALF SHEET 40X57 (DRAPES) IMPLANT
DRAPE LAPAROTOMY 100X72X124 (DRAPES) ×2 IMPLANT
DRSG OPSITE POSTOP 4X6 (GAUZE/BANDAGES/DRESSINGS) ×1 IMPLANT
DURAPREP 26ML APPLICATOR (WOUND CARE) ×2 IMPLANT
DURASEAL APPLICATOR TIP (TIP) IMPLANT
DURASEAL SPINE SEALANT 3ML (MISCELLANEOUS) IMPLANT
ELECT REM PT RETURN 9FT ADLT (ELECTROSURGICAL) ×2
ELECTRODE REM PT RTRN 9FT ADLT (ELECTROSURGICAL) ×1 IMPLANT
FLOSEAL 5ML (HEMOSTASIS) ×1 IMPLANT
GAUZE SPONGE 4X4 12PLY STRL (GAUZE/BANDAGES/DRESSINGS) ×2 IMPLANT
GAUZE SPONGE 4X4 16PLY XRAY LF (GAUZE/BANDAGES/DRESSINGS) IMPLANT
GLOVE BIOGEL PI IND STRL 7.0 (GLOVE) IMPLANT
GLOVE BIOGEL PI IND STRL 8.5 (GLOVE) ×2 IMPLANT
GLOVE BIOGEL PI INDICATOR 7.0 (GLOVE) ×1
GLOVE BIOGEL PI INDICATOR 8.5 (GLOVE) ×2
GLOVE ECLIPSE 8.5 STRL (GLOVE) ×2 IMPLANT
GLOVE SURG SS PI 6.5 STRL IVOR (GLOVE) ×3 IMPLANT
GLOVE SURG SS PI 7.5 STRL IVOR (GLOVE) ×1 IMPLANT
GOWN STRL REUS W/ TWL LRG LVL3 (GOWN DISPOSABLE) IMPLANT
GOWN STRL REUS W/ TWL XL LVL3 (GOWN DISPOSABLE) IMPLANT
GOWN STRL REUS W/TWL 2XL LVL3 (GOWN DISPOSABLE) ×4 IMPLANT
GOWN STRL REUS W/TWL LRG LVL3 (GOWN DISPOSABLE) ×2
GOWN STRL REUS W/TWL XL LVL3 (GOWN DISPOSABLE)
GRAFT BNE CANC CHIPS 1-8 20CC (Bone Implant) IMPLANT
HEMOSTAT POWDER KIT SURGIFOAM (HEMOSTASIS) IMPLANT
KIT BASIN OR (CUSTOM PROCEDURE TRAY) ×2 IMPLANT
KIT INFUSE MEDIUM (Orthopedic Implant) ×1 IMPLANT
KIT TURNOVER KIT B (KITS) ×2 IMPLANT
MILL MEDIUM DISP (BLADE) ×2 IMPLANT
NDL SPNL 18GX3.5 QUINCKE PK (NEEDLE) IMPLANT
NEEDLE HYPO 22GX1.5 SAFETY (NEEDLE) ×2 IMPLANT
NEEDLE SPNL 18GX3.5 QUINCKE PK (NEEDLE) IMPLANT
NS IRRIG 1000ML POUR BTL (IV SOLUTION) ×2 IMPLANT
PACK LAMINECTOMY NEURO (CUSTOM PROCEDURE TRAY) ×2 IMPLANT
PAD ARMBOARD 7.5X6 YLW CONV (MISCELLANEOUS) ×6 IMPLANT
PATTIES SURGICAL .5 X1 (DISPOSABLE) ×2 IMPLANT
PLASMABLADE 3.0S (MISCELLANEOUS) ×2
ROD RELINE-O LORDOTIC 5.5X60MM (Rod) ×2 IMPLANT
SCREW LOCK RELINE 5.5 TULIP (Screw) ×6 IMPLANT
SCREW RELINE-O POLY 7.5X45 (Screw) ×6 IMPLANT
SPONGE LAP 4X18 RFD (DISPOSABLE) IMPLANT
SPONGE SURGIFOAM ABS GEL 100 (HEMOSTASIS) ×2 IMPLANT
SUT PROLENE 6 0 BV (SUTURE) IMPLANT
SUT VIC AB 1 CT1 18XBRD ANBCTR (SUTURE) ×1 IMPLANT
SUT VIC AB 1 CT1 8-18 (SUTURE) ×2
SUT VIC AB 2-0 CP2 18 (SUTURE) ×3 IMPLANT
SUT VIC AB 2-0 CT2 18 VCP726D (SUTURE) ×1 IMPLANT
SUT VIC AB 3-0 SH 8-18 (SUTURE) ×3 IMPLANT
SYR 3ML LL SCALE MARK (SYRINGE) ×8 IMPLANT
SYR 5ML LL (SYRINGE) IMPLANT
TOWEL GREEN STERILE (TOWEL DISPOSABLE) ×2 IMPLANT
TOWEL GREEN STERILE FF (TOWEL DISPOSABLE) ×2 IMPLANT
TRAY FOLEY BAG SILVER LF 16FR (CATHETERS) ×1 IMPLANT
TRAY FOLEY MTR SLVR 16FR STAT (SET/KITS/TRAYS/PACK) ×1 IMPLANT
WATER STERILE IRR 1000ML POUR (IV SOLUTION) ×2 IMPLANT

## 2017-11-26 NOTE — Op Note (Signed)
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 Surgeon: Barnett AbuHenry Nikolos Billig Anesthesia: General endotracheal Indications: Ms. Mart PiggsCheryl Wojtaszek is a 62 year old individual who underwent a two-level lumbar decompression and fusion in January 2018.  She initially did fairly well but started to develop increasing pain in about 6 months and this is persisted.  Recent myelogram demonstrates that there is a pseudoarthrosis particularly at L4-5 but also likely at L5-S1.  Noted significantly is that the patient's vertebrae are numbered ambiguously that is she has an additional disc space below the L5-S1 space which is a transitional vertebrae.  Her vertebrae have been identified variably as L3-4 and 4 5 in the fusion segments and most recently on the myelogram the radiologist labeled them as L4-5 and L5-S1.  The pseudoarthrosis exists in the segments that have already been fused.  Procedure: Patient was brought to the operating room supine on the stretcher.  After the smooth induction of general endotracheal anesthesia, she was turned prone carefully.  Back was prepped with alcohol DuraPrep and draped in a sterile fashion.  Previously made lumbar incision was opened and the dissection was carried down through the lumbodorsal fascia to expose the hardware on either side from L4 to the sacrum.  Once the hardware was exposed screws were loosened it was evident that the screws and L4 were indeed loose at L5 the screws also had a loose field seem tighter in the screws and the sacrum also had a slightly loose and feel the screws were all removed but not before testing the pseudoarthrosis and there was noted to be minimal amount of motion at L4-5 but no motion I could be induced at L5-S1.  The dissection was then carefully carried out to  the lateral gutters to expose the transverse processes of L4-L5 and the sacrum.  These were then carefully decorticated and packed off.  This was done bilaterally in the meantime a medium size infuse was prepared.  Once the decortication was completed infuse was packed into the lateral gutters along with a total volume of 20 cc of allograft which was morselized.  This was placed in the lateral gutters using 3 cc syringe.  Then 7.5 x 45 mm screws were placed in L4-L5 and S1.  Precontoured 70 mm rod was then connected and an additional amount of bone graft was packed over the infuse in the lateral gutters on each side.  The same procedure with the same size screws was repeated for both sides.  Once all the hardware was in place final radiographs were obtained which showed good positioning of the hardware in good alignment overall.  Then the lumbodorsal fascia was reapproximated with #1 Vicryl in interrupted fashion 2-0 Vicryl was used in subcutaneous tissues and then 25 cc of half percent Marcaine was injected into the paraspinous fascia.  Final skin closure was then performed with 2-0 and 3-0 Vicryl.  Patient tolerated procedure well blood loss is estimated at 150 cc and she was returned to recovery room in stable condition.

## 2017-11-26 NOTE — Anesthesia Postprocedure Evaluation (Signed)
Anesthesia Post Note  Patient: [name]  Procedure(s) Performed: Lumbar three-four  Lumbar four five Revision of arthrodesis,pseudoarthrosis (N/A Back)     Patient location during evaluation: PACU Anesthesia Type: General Level of consciousness: sedated and patient cooperative Pain management: pain level controlled Vital Signs Assessment: post-procedure vital signs reviewed and stable Respiratory status: spontaneous breathing Cardiovascular status: stable Anesthetic complications: no    Last Vitals:  Vitals:   11/26/17 1152 11/26/17 1537  BP: (!) 92/55 105/61  Pulse: 75 75  Resp: 16 16  Temp: 37 C 37.1 C  SpO2: 96% 97%    Last Pain:  Vitals:   11/26/17 1708  TempSrc:   PainSc: 6                  Lewie LoronJohn Laquitta Dominski

## 2017-11-26 NOTE — Anesthesia Procedure Notes (Signed)
Procedure Name: Intubation Date/Time: 11/26/2017 7:41 AM Performed by: Moshe Salisbury, CRNA Pre-anesthesia Checklist: Patient identified, Emergency Drugs available, Suction available and Patient being monitored Patient Re-evaluated:Patient Re-evaluated prior to induction Oxygen Delivery Method: Circle System Utilized Preoxygenation: Pre-oxygenation with 100% oxygen Induction Type: IV induction Ventilation: Mask ventilation without difficulty Laryngoscope Size: Mac and 3 Grade View: Grade II Tube type: Oral Tube size: 7.5 mm Number of attempts: 1 Airway Equipment and Method: Stylet Placement Confirmation: ETT inserted through vocal cords under direct vision,  positive ETCO2 and breath sounds checked- equal and bilateral Secured at: 20 cm Tube secured with: Tape Dental Injury: Teeth and Oropharynx as per pre-operative assessment

## 2017-11-26 NOTE — H&P (Signed)
[name] is an 62 y.o. female.   Chief Complaint: Low back pain, pseudoarthrosis HPI: Tracy PiggsCheryl Savage is a 62 year old individuals had significant decompression and fusion from L4 to the sacrum.  She has a transitional level of the sacrum so it appears that her fusion would be from L3-L5.  She has had chronic ongoing back pain and a recent myelogram demonstrates that she has an adequate decompression however she has significant loosening of the hardware at L4-L5 and has been advised regarding the need for surgical revision of the pseudoarthrosis.  Various options were discussed and we decided on surgical intervention to do this.  Past Medical History:  Diagnosis Date  . Allergic rhinitis   . Anxiety   . Arthritis   . Asthma   . Atypical chest pain    cath 2007 showing normal coronary arteries, last echo 2003 with normal EF and no systolic dysfunction  . Broken ribs    Trauma falling off of horse November 2012  . Chest pain   . Chronic pain syndrome   . CVA (cerebral infarction)    reported by patient, no documentation  . Depression    history of prior suicide attempts  . DVT (deep venous thrombosis) (HCC)    remote at age 62  . GERD (gastroesophageal reflux disease)   . Headache   . Herniated nucleus pulposus, L4-5 left 2001   s/p Left microsurgical exploration L4-5 and microdiskectomy with lysis  . Hypotension    history of hypotension in the past requiring fluid boluses  . Hypothyroidism   . Mitral valve prolapse   . Mitral valve prolapse   . Pneumonia 08/29/2011  . Pseudoarthrosis of lumbar spine   . Seizures (HCC)    in the 1990's; takes neurontin for this;no seizures since then.  . Shortness of breath   . Stroke (HCC)    hx of TIA  . TIA (transient ischemic attack)     Past Surgical History:  Procedure Laterality Date  . ABDOMINAL HYSTERECTOMY    . APPENDECTOMY    . BREAST BIOPSY Right    neg  . CERVICAL SPINE SURGERY    . CHOLECYSTECTOMY    .  cholescystectomy  2003    Laparoscopic cholecystectomy with intraoperative  . ELBOW SURGERY Right    3 different surgeries   . INCISIONAL HERNIA REPAIR N/A 01/21/2016   Procedure: HERNIA REPAIR INCISIONAL WITH MESH;  Surgeon: Franky MachoMark Jenkins, MD;  Location: [facility];  Service: General;  Laterality: N/A;  . KNEE SURGERY Bilateral   . lower back surgery    . NERVE, TENDON AND ARTERY REPAIR Left 05/09/2013   Procedure: LEFT WRIST EXPLORATION ;  Surgeon: Tami RibasKevin R Kuzma, MD;  Location: [facility];  Service: Orthopedics;  Laterality: Left;  . OOPHORECTOMY    . right arm surgery     carpal tunnel; and tendon repair of elbow.  Marland Kitchen. SHOULDER SURGERY Right   . TONSILLECTOMY      Family History  Problem Relation Age of Onset  . Heart attack Brother        CABG  . Hypertrophic cardiomyopathy Son   . Cancer Mother        Uterine  . Cancer Father        brain  . Cancer Maternal Aunt        cancer  . Cancer Maternal Aunt        breast  . Cancer Maternal Aunt        breast  .  Cancer Cousin        breast  . Cancer Cousin        breast  . Cancer Cousin        breast   Social History:  reports that she has never smoked. She has never used smokeless tobacco. She reports that she does not drink alcohol or use drugs.  Allergies:  Allergies  Allergen Reactions  . Amoxicillin Hives, Rash and Other (See Comments)    REACTION: Stomach bleeding    . Ampicillin Rash and Other (See Comments)    REACTION: Stomach bleeding  . Aspirin Other (See Comments)    REACTION: ulcer and GI bleeding. Patient states she can take the 81mg  aspirin-NO 325MG   . Bee Venom Anaphylaxis  . Chlorzoxazone Rash and Other (See Comments)    Caused pleural effusion/excessive fluid buildup  . Ciprofloxacin Anaphylaxis and Other (See Comments)    REACTION: Throat swells shut  . Heparin Other (See Comments)    MAKES HEART STOP BEATING.   . Indomethacin Other (See Comments)    MAKES HEART STOP BEATING.   . Nsaids  Other (See Comments)    REACTION: Stomach bleeding  . Penicillins Hives, Shortness Of Breath, Swelling and Other (See Comments)    Has patient had a PCN reaction causing immediate rash, facial/tongue/throat swelling, SOB or lightheadedness with hypotension: Yes Has patient had a PCN reaction causing severe rash involving mucus membranes or skin necrosis: Yes Has patient had a PCN reaction that required hospitalization: Unknown Has patient had a PCN reaction occurring within the last 10 years: No If all of the above answers are "NO", then may proceed with Cephalosporin use.   . Albumin (Human) Rash and Other (See Comments)    Unknown other reactions  . Cephalexin Hives, Itching and Rash  . Chlorpromazine Rash and Other (See Comments)    hyperactivity  . Darvocet [Propoxyphene N-Acetaminophen] Itching, Nausea And Vomiting and Rash  . Dilaudid [Hydromorphone Hcl] Itching, Nausea And Vomiting and Swelling  . Fentanyl Hives and Nausea And Vomiting  . Hydrocodone Nausea And Vomiting  . Ketorolac Tromethamine Itching, Nausea And Vomiting and Rash  . Nortriptyline Hcl Itching, Rash and Other (See Comments)    REACTION: Hyperactive  . Oxycodone Nausea And Vomiting  . Pentazocine Nausea And Vomiting and Rash  . Pentazocine-Naloxone Nausea And Vomiting  . Percocet [Oxycodone-Acetaminophen] Itching, Nausea And Vomiting and Rash  . Propoxyphene Itching, Nausea And Vomiting and Rash    Darvon  . Sertraline Hcl Other (See Comments)    REACTION: Hyperactive - bounce off the wall  . Tramadol Hcl Nausea And Vomiting  . Latex Hives  . Sulfa Antibiotics Other (See Comments)    Per patient, can not take sulfa antibiotics, patient unsure her reaction but knows she "cant have it"  . Codeine Rash  . Hyoscyamine Sulfate Other (See Comments)  . Imipramine Itching and Rash  . Tramadol Rash    Medications Prior to Admission  Medication Sig Dispense Refill  . albuterol (PROVENTIL HFA;VENTOLIN HFA) 108  (90 BASE) MCG/ACT inhaler Inhale 2 puffs into the lungs every 6 (six) hours as needed for wheezing. 1 Inhaler 0  . aspirin EC 81 MG tablet Take 81 mg by mouth every evening.    . cetirizine (ZYRTEC) 10 MG tablet Take 10 mg by mouth 2 (two) times daily.     . cyclobenzaprine (FLEXERIL) 10 MG tablet Take 1 tablet (10 mg total) by mouth 2 (two) times daily as needed for muscle spasms. (  Patient taking differently: Take 10 mg by mouth 3 (three) times daily as needed for muscle spasms. ) 20 tablet 0  . diazepam (VALIUM) 5 MG tablet Take 10 mg by mouth at bedtime.    Marland Kitchen FLUoxetine (PROZAC) 20 MG capsule Take 60 mg by mouth daily.    . fluticasone (FLONASE) 50 MCG/ACT nasal spray Place 1 spray into both nostrils daily as needed for allergies or rhinitis.    Marland Kitchen gabapentin (NEURONTIN) 300 MG capsule Take 900-1,800 mg by mouth See admin instructions. Takes 900 mg by mouth in the morning, 900 mg by mouth in the afternoon, and 1800 mg by mouth at bedtime.    . hydrOXYzine (VISTARIL) 25 MG capsule Take 50 mg by mouth every evening.     Marland Kitchen levothyroxine (SYNTHROID, LEVOTHROID) 75 MCG tablet Take 75 mcg by mouth daily before breakfast.    . LORazepam (ATIVAN) 1 MG tablet Take 1 mg by mouth every 8 (eight) hours as needed for anxiety.     . meloxicam (MOBIC) 15 MG tablet Take 15 mg by mouth daily.     . meperidine (DEMEROL) 50 MG tablet Take 1 tablet (50 mg total) by mouth every 6 (six) hours as needed for moderate pain. FOR PAIN 60 tablet 0  . methocarbamol (ROBAXIN) 500 MG tablet Take 1 tablet (500 mg total) by mouth every 6 (six) hours as needed for muscle spasms. 60 tablet 3  . methylphenidate (RITALIN) 10 MG tablet Take 10 mg by mouth 2 (two) times daily as needed (ADD).     Marland Kitchen metoprolol succinate (TOPROL-XL) 50 MG 24 hr tablet Take 50 mg by mouth daily. Take with or immediately following a meal.    . omeprazole (PRILOSEC) 20 MG capsule Take 1 capsule (20 mg total) by mouth 2 (two) times daily. (Patient taking  differently: Take 20 mg by mouth 2 (two) times daily before a meal. ) 30 capsule 0  . ropinirole (REQUIP) 5 MG tablet Take 5 mg by mouth every evening.    . valACYclovir (VALTREX) 500 MG tablet Take 500 mg by mouth daily as needed (for sun exposure).      No results found for this or any previous visit (from the past 48 hour(s)). No results found.  Review of Systems  Constitutional: Negative.   HENT: Negative.   Eyes: Negative.   Respiratory: Negative.   Cardiovascular: Negative.   Gastrointestinal: Negative.   Genitourinary: Negative.   Musculoskeletal: Positive for back pain.  Skin: Negative.   Neurological: Positive for weakness.  Endo/Heme/Allergies: Negative.   Psychiatric/Behavioral: Negative.     Blood pressure (!) 172/103, pulse 88, temperature 97.9 F (36.6 C), temperature source Oral, resp. rate 20, height 5' 2.5" (1.588 m), weight 91.2 kg (201 lb), SpO2 98 %. Physical Exam  Constitutional: She is oriented to person, place, and time. She appears well-developed and well-nourished.  Obese  HENT:  Head: Normocephalic and atraumatic.  Eyes: Pupils are equal, round, and reactive to light. Conjunctivae and EOM are normal.  Neck: Normal range of motion. Neck supple.  Cardiovascular: Normal rate.  Respiratory: Effort normal and breath sounds normal.  GI: Soft. Bowel sounds are normal.  Musculoskeletal:  Moderate centralized back pain positive straight leg raising at 45 degrees bilaterally.  Patrick's maneuver is negative bilaterally.  Neurological: She is alert and oriented to person, place, and time.  Mild weakness in tibialis anterior right worse than left 4+ out of 5.  Deep tendon reflexes in the patella and the Achilles.  Skin: Skin is warm and dry.  Psychiatric: She has a normal mood and affect. Her behavior is normal. Judgment and thought content normal.     Assessment/Plan Pseudoarthrosis L4-L5 status post decompression and fusion L4 to sacrum.  Plan: Revision  of pseudoarthrosis L4 to sacrum  Tracy Dama, MD 11/26/2017, 7:28 AM

## 2017-11-26 NOTE — Transfer of Care (Signed)
Immediate Anesthesia Transfer of Care Note  Patient: [name]  Procedure(s) Performed: Lumbar three-four  Lumbar four five Revision of arthrodesis,pseudoarthrosis (N/A Back)  Patient Location: PACU  Anesthesia Type:General  Level of Consciousness: awake  Airway & Oxygen Therapy: Patient Spontanous Breathing and Patient connected to nasal cannula oxygen  Post-op Assessment: Report given to RN, Post -op Vital signs reviewed and stable and Patient moving all extremities  Post vital signs: Reviewed and stable  Last Vitals:  Vitals Value Taken Time  BP    Temp    Pulse    Resp    SpO2      Last Pain:  Vitals:   11/26/17 0615  TempSrc:   PainSc: 0-No pain      Patients Stated Pain Goal: 3 (11/26/17 0615)  Complications: No apparent anesthesia complications

## 2017-11-26 NOTE — Evaluation (Signed)
Physical Therapy Evaluation Patient Details Name: [name] MRN: [id number] DOB: [birth year] Today's Date: 11/26/2017   History of Present Illness  Pt is a 62 y/o female s/p L4-S1 PLIF. PMH includes asthma, CVA, DVT, seizures, and cervical spine surgery.   Clinical Impression  Patient is s/p above surgery resulting in the deficits listed below (see PT Problem List). Pt limited secondary to pain this session and presenting with some unsteadiness. Required min guard A for mobility and was very guarded throughout gait. Educated about back precautions, walking program, and use of DME at home to increase safety. Patient will benefit from skilled PT to increase their independence and safety with mobility (while adhering to their precautions) to allow discharge to the venue listed below.     Follow Up Recommendations No PT follow up;Supervision for mobility/OOB    Equipment Recommendations  None recommended by PT    Recommendations for Other Services       Precautions / Restrictions Precautions Precautions: Back Precaution Booklet Issued: Yes (comment) Precaution Comments: Reviewed back precautions with pt.  Required Braces or Orthoses: Spinal Brace Spinal Brace: Lumbar corset;Applied in standing position Restrictions Weight Bearing Restrictions: No      Mobility  Bed Mobility Overal bed mobility: Needs Assistance Bed Mobility: Rolling;Sidelying to Sit;Sit to Sidelying Rolling: Supervision Sidelying to sit: Supervision     Sit to sidelying: Supervision General bed mobility comments: Supervision and increased time required to perform bed mobility. Verbal cues for sequencing using log roll technique.   Transfers Overall transfer level: Needs assistance Equipment used: None Transfers: Sit to/from Stand Sit to Stand: Min guard         General transfer comment: Min guard for steadying assist. Increased time required secondary to pain.   Ambulation/Gait Ambulation/Gait  assistance: Min guard Gait Distance (Feet): 150 Feet Assistive device: None Gait Pattern/deviations: Step-through pattern;Decreased stride length Gait velocity: Decreased    General Gait Details: Very slow, extremely guarded gait. Pt reports sharp pain in RLE down to middle toe during gait. Reaching out for hand rail in hall for support. Unsteadiness noted, however, no overt LOB noted. Educated about using RW at home to increase safety and educated about generalized walking program to perform at home.   Stairs            Wheelchair Mobility    Modified Rankin (Stroke Patients Only)       Balance Overall balance assessment: Needs assistance Sitting-balance support: No upper extremity supported;Feet supported Sitting balance-Leahy Scale: Fair     Standing balance support: No upper extremity supported;During functional activity;Single extremity supported Standing balance-Leahy Scale: Fair Standing balance comment: Able to maintain static standing without UE support.                             Pertinent Vitals/Pain Pain Assessment: Faces Faces Pain Scale: Hurts whole lot Pain Location: back  Pain Descriptors / Indicators: Aching;Operative site guarding Pain Intervention(s): Limited activity within patient's tolerance;Monitored during session;Repositioned    Home Living Family/patient expects to be discharged to:: Private residence Living Arrangements: Spouse/significant other;Children Available Help at Discharge: Family;Available PRN/intermittently Type of Home: House Home Access: Stairs to enter Entrance Stairs-Rails: Right;Left;Can reach both Entrance Stairs-Number of Steps: 4-5 Home Layout: One level Home Equipment: Walker - 2 wheels;Cane - single point;Bedside commode      Prior Function Level of Independence: Independent               Hand  Dominance        Extremity/Trunk Assessment   Upper Extremity Assessment Upper Extremity  Assessment: Defer to OT evaluation    Lower Extremity Assessment Lower Extremity Assessment: Generalized weakness;RLE deficits/detail;LLE deficits/detail RLE Deficits / Details: Reports sharp pain in RLE that went down to middle toe. Reports this is new.  LLE Deficits / Details: Reports weakness/fatigue in LLE    Cervical / Trunk Assessment Cervical / Trunk Assessment: Other exceptions Cervical / Trunk Exceptions: s/p PLIF   Communication   Communication: No difficulties  Cognition Arousal/Alertness: Awake/alert Behavior During Therapy: WFL for tasks assessed/performed Overall Cognitive Status: Within Functional Limits for tasks assessed                                        General Comments General comments (skin integrity, edema, etc.): Pt's husband present during session. Educated about how to maintain precautions during toileting tasks.     Exercises     Assessment/Plan    PT Assessment Patient needs continued PT services  PT Problem List Decreased strength;Decreased balance;Decreased mobility;Decreased activity tolerance;Decreased knowledge of use of DME;Decreased knowledge of precautions;Pain       PT Treatment Interventions DME instruction;Gait training;Stair training;Functional mobility training;Therapeutic activities;Balance training;Therapeutic exercise;Patient/family education    PT Goals (Current goals can be found in the Care Plan section)  Acute Rehab PT Goals Patient Stated Goal: "for my pain to get better"  PT Goal Formulation: With patient Time For Goal Achievement: 12/10/17 Potential to Achieve Goals: Good    Frequency Min 5X/week   Barriers to discharge        Co-evaluation               AM-PAC PT "6 Clicks" Daily Activity  Outcome Measure Difficulty turning over in bed (including adjusting bedclothes, sheets and blankets)?: A Little Difficulty moving from lying on back to sitting on the side of the bed? : A  Lot Difficulty sitting down on and standing up from a chair with arms (e.g., wheelchair, bedside commode, etc,.)?: Unable Help needed moving to and from a bed to chair (including a wheelchair)?: A Little Help needed walking in hospital room?: A Little Help needed climbing 3-5 steps with a railing? : A Lot 6 Click Score: 14    End of Session Equipment Utilized During Treatment: Back brace Activity Tolerance: Patient limited by pain Patient left: in bed;with call bell/phone within reach;with family/visitor present Nurse Communication: Mobility status;Patient requests pain meds PT Visit Diagnosis: Other abnormalities of gait and mobility (R26.89);Pain Pain - part of body: (back )    Time: [id number] PT Time Calculation (min) (ACUTE ONLY): 19 min   Charges:   PT Evaluation $PT Eval Low Complexity: 1 Low     PT G Codes:       Gladys DammeBrittany Farhan Jean, PT, DPT  Acute Rehabilitation Services  Pager: [phone]   Lehman PromBrittany S Comfort Iversen 11/26/2017, 4:42 PM

## 2017-11-27 MED ORDER — DEXAMETHASONE 4 MG PO TABS
2.0000 mg | ORAL_TABLET | Freq: Two times a day (BID) | ORAL | Status: DC
  Administered 2017-11-28: 2 mg via ORAL
  Filled 2017-11-27: qty 1

## 2017-11-27 MED ORDER — MEPERIDINE HCL 50 MG PO TABS
50.0000 mg | ORAL_TABLET | ORAL | Status: DC | PRN
  Administered 2017-11-27 – 2017-11-28 (×4): 50 mg via ORAL
  Filled 2017-11-27 (×4): qty 1

## 2017-11-27 MED ORDER — DEXAMETHASONE 1 MG PO TABS: ORAL_TABLET | ORAL | 0 refills | Status: DC

## 2017-11-27 MED ORDER — DEXAMETHASONE 4 MG PO TABS
4.0000 mg | ORAL_TABLET | Freq: Once | ORAL | Status: AC
  Administered 2017-11-27: 4 mg via ORAL
  Filled 2017-11-27: qty 1

## 2017-11-27 NOTE — Progress Notes (Signed)
Physical Therapy Treatment Patient Details Name: [name] MRN: [id number] DOB: [birth year] Today's Date: 11/27/2017    History of Present Illness Pt is a 63 y/o female s/p L4-S1 PLIF. PMH includes asthma, CVA, DVT, seizures, and cervical spine surgery.     PT Comments    Pt progressing well. Pt slightly impulsive reporting "this isn't my first rodeo". Pt ambulating without AD and was able to complete stair negotiation with min guard assist. Pt with good home set up and support. Acute PT to con't to follow while in hospital.   Follow Up Recommendations  No PT follow up;Supervision for mobility/OOB     Equipment Recommendations  None recommended by PT    Recommendations for Other Services       Precautions / Restrictions Precautions Precautions: Back Precaution Booklet Issued: Yes (comment) Precaution Comments: Reviewed back precautions with pt, pt with verbal understanding, verbal cues to adhere to them functionally Required Braces or Orthoses: Spinal Brace Spinal Brace: Lumbar corset;Applied in standing position Restrictions Weight Bearing Restrictions: No    Mobility  Bed Mobility Overal bed mobility: Needs Assistance Bed Mobility: Rolling;Sidelying to Sit Rolling: Supervision Sidelying to sit: Supervision     Sit to sidelying: Supervision General bed mobility comments: educated on log roll and not pulling self up by reaching above head and pulling on head of the bed, no physical assist needed  Transfers Overall transfer level: Needs assistance Equipment used: None Transfers: Sit to/from Stand Sit to Stand: Supervision         General transfer comment: minimal bending  Ambulation/Gait Ambulation/Gait assistance: Supervision Gait Distance (Feet): 250 Feet Assistive device: None Gait Pattern/deviations: Step-through pattern;Decreased stride length Gait velocity: wfl for just having surgery Gait velocity interpretation: >2.62 ft/sec, indicative of  community ambulatory General Gait Details: verbal cues to not arch back. instructed to contrat abdominal muscles to help off weight the back in addition to the back brace. Pt reports lower abdominal cramping at 9/10 pain during ambulation. verbal cues to not twist when trying to turn around   Stairs Stairs: Yes Stairs assistance: Min guard Stair Management: Two rails;Step to pattern Number of Stairs: 10 General stair comments: verbal cues to lead with the R going up and the L going down.    Wheelchair Mobility    Modified Rankin (Stroke Patients Only)       Balance Overall balance assessment: Mild deficits observed, not formally tested                                          Cognition Arousal/Alertness: Awake/alert Behavior During Therapy: Impulsive Overall Cognitive Status: Within Functional Limits for tasks assessed                                 General Comments: pt talks fast, easily distracted and slightly impulsive, however suspect this is her baseline      Exercises      General Comments General comments (skin integrity, edema, etc.): incision with dressing intact      Pertinent Vitals/Pain Pain Assessment: 0-10 Pain Score: 9  Faces Pain Scale: Hurts even more Pain Location: back Pain Descriptors / Indicators: Aching;Operative site guarding Pain Intervention(s): Monitored during session    Home Living Family/patient expects to be discharged to:: Private residence Living Arrangements: Spouse/significant other;Children Available Help at Discharge: Family;Available  PRN/intermittently Type of Home: House Home Access: Stairs to enter Entrance Stairs-Rails: Right;Left;Can reach both Home Layout: One level Home Equipment: Environmental consultantWalker - 2 wheels;Cane - single point;Bedside commode;Adaptive equipment      Prior Function Level of Independence: Independent          PT Goals (current goals can now be found in the care plan  section) Acute Rehab PT Goals Patient Stated Goal: go home Progress towards PT goals: Progressing toward goals    Frequency    Min 5X/week      PT Plan Current plan remains appropriate    Co-evaluation              AM-PAC PT "6 Clicks" Daily Activity  Outcome Measure  Difficulty turning over in bed (including adjusting bedclothes, sheets and blankets)?: A Little Difficulty moving from lying on back to sitting on the side of the bed? : A Little Difficulty sitting down on and standing up from a chair with arms (e.g., wheelchair, bedside commode, etc,.)?: A Little Help needed moving to and from a bed to chair (including a wheelchair)?: A Little Help needed walking in hospital room?: A Little Help needed climbing 3-5 steps with a railing? : A Little 6 Click Score: 18    End of Session Equipment Utilized During Treatment: Back brace Activity Tolerance: Patient limited by pain Patient left: (in hallway with OT) Nurse Communication: Mobility status PT Visit Diagnosis: Other abnormalities of gait and mobility (R26.89);Pain Pain - Right/Left: (back)     Time: [id number] PT Time Calculation (min) (ACUTE ONLY): 18 min  Charges:  $Gait Training: 8-22 mins                    G Codes:       Lewis ShockAshly Graci Hulce, PT, DPT Pager #: [phone] Office #: [phone]    Ayaana Biondo M Krina Mraz 11/27/2017, 10:11 AM

## 2017-11-27 NOTE — Progress Notes (Signed)
Patient ID: Tracy BlaseCheryl C Christopoulos, female   DOB: 11-06-1955, 62 y.o.   MRN: [id number] Vital signs are stable Patient complaining of increased back pain today Incision is clean and dry Pain may be associated with the use of infuse We will add steroids Plan discharge for the a.m. Patient has pain meds at home We will sent home with Decadron taper

## 2017-11-27 NOTE — Discharge Summary (Signed)
Physician Discharge Summary  Patient ID: [name] MRN: [id number] DOB/AGE: 1956/04/24 62 y.o.  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  Consults: None  Significant Diagnostic Studies: None  Treatments: surgery: Revision of pseudoarthrosis L4 to sacrum  Discharge Exam: Blood pressure (!) 109/49, pulse 90, temperature 98.4 F (36.9 C), temperature source Oral, resp. rate 16, height 5' 2.5" (1.588 m), weight 91.2 kg (201 lb), SpO2 97 %. Incision is clean and dry motor function is intact  Disposition: Discharge disposition: 01-Home or Self Care       Discharge Instructions    Call MD for:  redness, tenderness, or signs of infection (pain, swelling, redness, odor or green/yellow discharge around incision site)   Complete by:  As directed    Call MD for:  severe uncontrolled pain   Complete by:  As directed    Call MD for:  temperature >100.4   Complete by:  As directed    Diet - low sodium heart healthy   Complete by:  As directed    Discharge instructions   Complete by:  As directed    Okay to shower. Do not apply salves or appointments to incision. No heavy lifting with the upper extremities greater than 15 pounds. May resume driving when not requiring pain medication and patient feels comfortable with doing so.   Incentive spirometry RT   Complete by:  As directed    Increase activity slowly   Complete by:  As directed      Allergies as of 11/27/2017      Reactions   Amoxicillin Hives, Rash, Other (See Comments)   REACTION: Stomach bleeding   Ampicillin Rash, Other (See Comments)   REACTION: Stomach  bleeding   Aspirin Other (See Comments)   REACTION: ulcer and GI bleeding. Patient states she can take the 81mg  aspirin-NO 325MG    Bee Venom Anaphylaxis   Chlorzoxazone Rash, Other (See Comments)   Caused pleural effusion/excessive fluid buildup   Ciprofloxacin Anaphylaxis, Other (See Comments)   REACTION: Throat swells shut   Heparin Other (See Comments)   MAKES HEART STOP BEATING.    Indomethacin Other (See Comments)   MAKES HEART STOP BEATING.    Nsaids Other (See Comments)   REACTION: Stomach bleeding   Penicillins Hives, Shortness Of Breath, Swelling, Other (See Comments)   Has patient had a PCN reaction causing immediate rash, facial/tongue/throat swelling, SOB or lightheadedness with hypotension: Yes Has patient had a PCN reaction causing severe rash involving mucus membranes or skin necrosis: Yes Has patient had a PCN reaction that required hospitalization: Unknown Has patient had a PCN reaction occurring within the last 10 years: No If all of the above answers are "NO", then may proceed with Cephalosporin use.   Sulfa Antibiotics Other (See Comments)   Per patient, can not take sulfa antibiotics, patient unsure her reaction but knows she "cant have it"   Albumin (human) Rash, Other (See Comments)   Unknown other reactions   Cephalexin Hives, Itching, Rash   Chlorpromazine Rash, Other (See Comments)   hyperactivity   Darvocet [propoxyphene N-acetaminophen] Itching, Nausea And Vomiting, Rash   Dilaudid [hydromorphone Hcl] Itching, Nausea And Vomiting, Swelling   Fentanyl Hives,  Nausea And Vomiting   Hydrocodone Nausea And Vomiting   Ketorolac Tromethamine Itching, Nausea And Vomiting, Rash   Latex Hives   Nortriptyline Hcl Itching, Rash, Other (See Comments)   REACTION: Hyperactive   Oxycodone Nausea And Vomiting   Pentazocine Nausea And Vomiting, Rash   Pentazocine-naloxone Nausea And Vomiting   Percocet [oxycodone-acetaminophen] Itching, Nausea And Vomiting, Rash    Propoxyphene Itching, Nausea And Vomiting, Rash   Darvon   Sertraline Hcl Other (See Comments)   REACTION: Hyperactive - bounce off the wall   Tramadol Hcl Nausea And Vomiting   Codeine Rash   Hyoscyamine Sulfate Other (See Comments)   Imipramine Itching, Rash   Tramadol Rash      Medication List    TAKE these medications   albuterol 108 (90 Base) MCG/ACT inhaler Commonly known as:  PROVENTIL HFA;VENTOLIN HFA Inhale 2 puffs into the lungs every 6 (six) hours as needed for wheezing.   aspirin EC 81 MG tablet Take 81 mg by mouth every evening.   cetirizine 10 MG tablet Commonly known as:  ZYRTEC Take 10 mg by mouth 2 (two) times daily.   cyclobenzaprine 10 MG tablet Commonly known as:  FLEXERIL Take 1 tablet (10 mg total) by mouth 2 (two) times daily as needed for muscle spasms. What changed:  when to take this   dexamethasone 1 MG tablet Commonly known as:  DECADRON 2 tablets twice daily for 2 days, one tablet twice daily for 2 days, one tablet daily for 2 days.   diazepam 5 MG tablet Commonly known as:  VALIUM Take 10 mg by mouth at bedtime.   FLUoxetine 20 MG capsule Commonly known as:  PROZAC Take 60 mg by mouth daily.   fluticasone 50 MCG/ACT nasal spray Commonly known as:  FLONASE Place 1 spray into both nostrils daily as needed for allergies or rhinitis.   gabapentin 300 MG capsule Commonly known as:  NEURONTIN Take 900-1,800 mg by mouth See admin instructions. Takes 900 mg by mouth in the morning, 900 mg by mouth in the afternoon, and 1800 mg by mouth at bedtime.   hydrOXYzine 25 MG capsule Commonly known as:  VISTARIL Take 50 mg by mouth every evening.   levothyroxine 75 MCG tablet Commonly known as:  SYNTHROID, LEVOTHROID Take 75 mcg by mouth daily before breakfast.   LORazepam 1 MG tablet Commonly known as:  ATIVAN Take 1 mg by mouth every 8 (eight) hours as needed for anxiety.   meloxicam 15 MG tablet Commonly known as:  MOBIC Take 15 mg by  mouth daily.   meperidine 50 MG tablet Commonly known as:  DEMEROL Take 1 tablet (50 mg total) by mouth every 6 (six) hours as needed for moderate pain. FOR PAIN   methocarbamol 500 MG tablet Commonly known as:  ROBAXIN Take 1 tablet (500 mg total) by mouth every 6 (six) hours as needed for muscle spasms.   metoprolol succinate 50 MG 24 hr tablet Commonly known as:  TOPROL-XL Take 50 mg by mouth daily. Take with or immediately following a meal.   omeprazole 20 MG capsule Commonly known as:  PRILOSEC Take 1 capsule (20 mg total) by mouth 2 (two) times daily. What changed:  when to take this   RITALIN 10 MG tablet Generic drug:  methylphenidate Take 10 mg by mouth 2 (two) times daily as needed (ADD).   ropinirole 5 MG tablet Commonly known as:  REQUIP Take 5 mg by mouth every evening.   valACYclovir 500  MG tablet Commonly known as:  VALTREX Take 500 mg by mouth daily as needed (for sun exposure).        SignedStefani Dama 11/27/2017, 5:07 PM

## 2017-11-27 NOTE — Evaluation (Signed)
Occupational Therapy Evaluation and Discharge Patient Details Name: [name] MRN: [id number] DOB: 06/25/1955 Today's Date: 11/27/2017    History of Present Illness Pt is a 62 y/o female s/p L4-S1 PLIF. PMH includes asthma, CVA, DVT, seizures, and cervical spine surgery.    Clinical Impression   Pt reports she was independent with ADL PTA. Currently pt supervision with ADL and functional mobility. All back, safety, and ADL education completed with pt. Pt reports she recalls much of the education from prior back surgery ~1.5 years ago. Pt planning to d/c home with supervision from family. No further acute OT needs identified; signing off at this time. Please re-consult if needs change. Thank you for this referral.      Follow Up Recommendations  No OT follow up;Supervision - Intermittent    Equipment Recommendations  None recommended by OT    Recommendations for Other Services       Precautions / Restrictions Precautions Precautions: Back Precaution Booklet Issued: No Precaution Comments: Reviewed back precautions with pt.  Required Braces or Orthoses: Spinal Brace Spinal Brace: Lumbar corset;Applied in sitting position Restrictions Weight Bearing Restrictions: No      Mobility Bed Mobility Overal bed mobility: Needs Assistance Bed Mobility: Sit to Sidelying         Sit to sidelying: Supervision General bed mobility comments: Good log roll technique. Supervision for safety, HOB flat without use of bed rail. Educated on pillow between knees when in sidelying.  Transfers Overall transfer level: Needs assistance Equipment used: None Transfers: Sit to/from Stand Sit to Stand: Supervision         General transfer comment: for safety    Balance Overall balance assessment: Mild deficits observed, not formally tested                                         ADL either performed or assessed with clinical judgement   ADL Overall ADL's : Needs  assistance/impaired Eating/Feeding: Independent;Sitting   Grooming: Supervision/safety;Standing Grooming Details (indicate cue type and reason): Educated on use of 2 cups for oral care Upper Body Bathing: Set up;Sitting   Lower Body Bathing: Supervison/ safety;Sit to/from stand   Upper Body Dressing : Set up;Sitting   Lower Body Dressing: Supervision/safety;Sit to/from stand Lower Body Dressing Details (indicate cue type and reason): Educated on compensatory strategies for LB ADL; pt able to return demo technique Toilet Transfer: Supervision/safety;Ambulation Toilet Transfer Details (indicate cue type and reason): Simulated    Toileting - Clothing Manipulation Details (indicate cue type and reason): Educated on proper technique for peri care without twisting     Functional mobility during ADLs: Supervision/safety General ADL Comments: Educated pt on maintaining back precautions during functional activities, brace management and wear schedule, log roll for bed mobility, frequent mobility throughout the day upon return home. Pt reports she recalls much information from previous back sx.     Vision         Perception     Praxis      Pertinent Vitals/Pain Pain Assessment: Faces Pain Score: 9  Faces Pain Scale: Hurts even more Pain Location: back Pain Descriptors / Indicators: Cramping Pain Intervention(s): Monitored during session;Limited activity within patient's tolerance;Repositioned     Hand Dominance     Extremity/Trunk Assessment Upper Extremity Assessment Upper Extremity Assessment: Overall WFL for tasks assessed   Lower Extremity Assessment Lower Extremity Assessment: Defer to PT evaluation  Cervical / Trunk Assessment Cervical / Trunk Assessment: Other exceptions Cervical / Trunk Exceptions: s/p PLIF    Communication Communication Communication: No difficulties   Cognition Arousal/Alertness: Awake/alert Behavior During Therapy: WFL for tasks  assessed/performed Overall Cognitive Status: Within Functional Limits for tasks assessed                                 General Comments: Pt with tangential speech but does recall precautions and able to perform functional tasks maintaining back precautions.   General Comments       Exercises     Shoulder Instructions      Home Living Family/patient expects to be discharged to:: Private residence Living Arrangements: Spouse/significant other;Children Available Help at Discharge: Family;Available PRN/intermittently Type of Home: House Home Access: Stairs to enter [employer] of Steps: 4-5 Entrance Stairs-Rails: Right;Left;Can reach both Home Layout: One level     Bathroom Shower/Tub: Chief Strategy Officer: Handicapped height     Home Equipment: Environmental consultant - 2 wheels;Cane - single point;Bedside commode;Adaptive equipment Adaptive Equipment: Reacher        Prior Functioning/Environment Level of Independence: Independent                 OT Problem List: Impaired balance (sitting and/or standing);Pain      OT Treatment/Interventions:      OT Goals(Current goals can be found in the care plan section) Acute Rehab OT Goals Patient Stated Goal: go home tomorrow OT Goal Formulation: All assessment and education complete, DC therapy  OT Frequency:     Barriers to D/C:            Co-evaluation              AM-PAC PT "6 Clicks" Daily Activity     Outcome Measure Help from another person eating meals?: None Help from another person taking care of personal grooming?: A Little Help from another person toileting, which includes using toliet, bedpan, or urinal?: A Little Help from another person bathing (including washing, rinsing, drying)?: A Little Help from another person to put on and taking off regular upper body clothing?: None Help from another person to put on and taking off regular lower body clothing?: A Little 6  Click Score: 20   End of Session Equipment Utilized During Treatment: Back brace Nurse Communication: Mobility status;Other (comment)(no equipment or f/u needs)  Activity Tolerance: Patient tolerated treatment well Patient left: in bed;with call bell/phone within reach  OT Visit Diagnosis: Unsteadiness on feet (R26.81);Pain Pain - part of body: (back)                Time: 1610-9604 OT Time Calculation (min): 16 min Charges:  OT General Charges $OT Visit: 1 Visit OT Evaluation $OT Eval Moderate Complexity: 1 Mod G-Codes:     Tracy Gabrielson A. Brett Albino, M.S., OTR/L Acute Rehab Department: [phone]  [name] 11/27/2017, 10:04 AM

## 2017-11-28 MED ORDER — MEPERIDINE HCL 50 MG PO TABS: 50.0000 mg | ORAL_TABLET | ORAL | Status: DC | PRN

## 2017-11-28 NOTE — Progress Notes (Signed)
Patient is discharged from room 3C07 at this time. Alert and in stable condition. IV site d/c'd and instructions read to patient and spouse with understanding verbalized. Left unit via wheelchair with all belongings at side. 

## 2017-11-28 NOTE — Discharge Instructions (Signed)
Wound Care °Leave incision open to air. °You may shower. °Do not scrub directly on incision.  °Do not put any creams, lotions, or ointments on incision. °Activity °Walk each and every day, increasing distance each day. °No lifting greater than 5 lbs.  Avoid excessive neck motion. °No driving for 2 weeks; may ride as a passenger locally. ° °Diet °Resume your normal diet.  °Return to Work °Will be discussed at you follow up appointment. °Call Your Doctor If Any of These Occur °Redness, drainage, or swelling at the wound.  °Temperature greater than 101 degrees. °Severe pain not relieved by pain medication. °Increased difficulty swallowing. °Incision starts to come apart. °Follow Up Appt °Call today for appointment in 3 weeks ([phone]) or for problems.  If you have any hardware placed in your spine, you will need an x-ray before your appointment. ° ° °

## 2017-11-28 NOTE — Progress Notes (Signed)
Physical Therapy Treatment Patient Details Name: Tracy BlaseCheryl C Like MRN: [id number] DOB: 1956/01/13 Today's Date: 11/28/2017    History of Present Illness Pt is a 62 y/o female s/p L4-S1 PLIF. PMH includes asthma, CVA, DVT, seizures, and cervical spine surgery.     PT Comments    Patient seen for mobility progression s/p spinal surgery. Mobilizing well. Educated patient on precautions, mobility expectations, safety and car transfers. Anticipate patient will be safe for d/c home.    Follow Up Recommendations  No PT follow up;Supervision for mobility/OOB     Equipment Recommendations  None recommended by PT    Recommendations for Other Services       Precautions / Restrictions Precautions Precautions: Back Precaution Booklet Issued: Yes (comment) Precaution Comments: Reviewed back precautions with pt, pt with verbal understanding, verbal cues to adhere to them functionally Required Braces or Orthoses: Spinal Brace Spinal Brace: Lumbar corset;Applied in standing position Restrictions Weight Bearing Restrictions: No    Mobility  Bed Mobility Overal bed mobility: Needs Assistance Bed Mobility: Rolling;Sidelying to Sit Rolling: Supervision Sidelying to sit: Supervision       General bed mobility comments: educated on log roll and not pulling self up by reaching above head and pulling on head of the bed, no physical assist needed  Transfers Overall transfer level: Needs assistance Equipment used: None Transfers: Sit to/from Stand Sit to Stand: Supervision         General transfer comment: minimal bending  Ambulation/Gait Ambulation/Gait assistance: Supervision Gait Distance (Feet): 300 Feet Assistive device: None Gait Pattern/deviations: Step-through pattern;Decreased stride length   Gait velocity interpretation: >2.62 ft/sec, indicative of community ambulatory General Gait Details: steady with ambulation, ambulated some without device 3830ft. VCs for increased  cadence    Stairs Stairs: Yes Stairs assistance: Supervision Stair Management: Two rails;Step to pattern Number of Stairs: 4 General stair comments: VCs for sequencing   Wheelchair Mobility    Modified Rankin (Stroke Patients Only)       Balance Overall balance assessment: Mild deficits observed, not formally tested                                          Cognition Arousal/Alertness: Awake/alert Behavior During Therapy: WFL for tasks assessed/performed Overall Cognitive Status: Within Functional Limits for tasks assessed                                 General Comments: tangential during session      Exercises      General Comments        Pertinent Vitals/Pain Pain Assessment: Faces Faces Pain Scale: Hurts little more Pain Location: back Pain Descriptors / Indicators: Aching;Operative site guarding Pain Intervention(s): Monitored during session    Home Living                      Prior Function            PT Goals (current goals can now be found in the care plan section) Acute Rehab PT Goals Patient Stated Goal: go home PT Goal Formulation: With patient Time For Goal Achievement: 12/10/17 Potential to Achieve Goals: Good Progress towards PT goals: Progressing toward goals    Frequency    Min 5X/week      PT Plan Current plan remains appropriate    Co-evaluation  AM-PAC PT "6 Clicks" Daily Activity  Outcome Measure  Difficulty turning over in bed (including adjusting bedclothes, sheets and blankets)?: A Little Difficulty moving from lying on back to sitting on the side of the bed? : A Little Difficulty sitting down on and standing up from a chair with arms (e.g., wheelchair, bedside commode, etc,.)?: A Little Help needed moving to and from a bed to chair (including a wheelchair)?: A Little Help needed walking in hospital room?: A Little Help needed climbing 3-5 steps with a  railing? : A Little 6 Click Score: 18    End of Session Equipment Utilized During Treatment: Back brace Activity Tolerance: Patient limited by pain Patient left: in chair;with call bell/phone within reach Nurse Communication: Mobility status PT Visit Diagnosis: Other abnormalities of gait and mobility (R26.89);Pain Pain - Right/Left: (back)     Time: 1610-9604 PT Time Calculation (min) (ACUTE ONLY): 18 min  Charges:  $Gait Training: 8-22 mins                    G Codes:       Charlotte Crumb, PT DPT  Board Certified Neurologic Specialist [phone]    Fabio Asa 11/28/2017, 7:49 AM

## 2018-04-01 ENCOUNTER — Other Ambulatory Visit (HOSPITAL_COMMUNITY)
Admission: RE | Admit: 2018-04-01 | Discharge: 2018-04-01 | Disposition: A | Discharge status: Home / Self Care | Payer: Medicare Other | Source: Ambulatory Visit | Attending: Nurse Practitioner | Admitting: Nurse Practitioner

## 2018-04-01 ENCOUNTER — Ambulatory Visit (HOSPITAL_COMMUNITY)
Admission: RE | Admit: 2018-04-01 | Discharge: 2018-04-01 | Disposition: A | Discharge status: Home / Self Care | Payer: Medicare Other | Source: Ambulatory Visit | Attending: Nurse Practitioner | Admitting: Nurse Practitioner

## 2018-04-01 ENCOUNTER — Other Ambulatory Visit (HOSPITAL_COMMUNITY): Payer: Self-pay | Admitting: Nurse Practitioner

## 2018-04-01 DIAGNOSIS — R059 Cough, unspecified: Secondary | ICD-10-CM

## 2018-04-01 DIAGNOSIS — R05 Cough: Secondary | ICD-10-CM | POA: Diagnosis not present

## 2018-04-01 LAB — CBC WITH DIFFERENTIAL/PLATELET
Abs Immature Granulocytes: 0.04 10*3/uL (ref 0.00–0.07)
BASOS ABS: 0 10*3/uL (ref 0.0–0.1)
Basophils Relative: 0 %
EOS ABS: 0.4 10*3/uL (ref 0.0–0.5)
EOS PCT: 3 %
HCT: 40.8 % (ref 36.0–46.0)
HEMOGLOBIN: 12.6 g/dL (ref 12.0–15.0)
Immature Granulocytes: 0 %
LYMPHS PCT: 28 %
Lymphs Abs: 3.1 10*3/uL (ref 0.7–4.0)
MCH: 27.8 pg (ref 26.0–34.0)
MCHC: 30.9 g/dL (ref 30.0–36.0)
MCV: 89.9 fL (ref 80.0–100.0)
Monocytes Absolute: 0.8 10*3/uL (ref 0.1–1.0)
Monocytes Relative: 7 %
NRBC: 0 % (ref 0.0–0.2)
Neutro Abs: 6.7 10*3/uL (ref 1.7–7.7)
Neutrophils Relative %: 62 %
Platelets: 405 10*3/uL — ABNORMAL HIGH (ref 150–400)
RBC: 4.54 MIL/uL (ref 3.87–5.11)
RDW: 13.1 % (ref 11.5–15.5)
WBC: 11.1 10*3/uL — AB (ref 4.0–10.5)

## 2018-04-02 ENCOUNTER — Other Ambulatory Visit: Payer: Self-pay

## 2018-04-02 ENCOUNTER — Emergency Department (HOSPITAL_COMMUNITY)
Admission: EM | Admit: 2018-04-02 | Discharge: 2018-04-02 | Disposition: A | Discharge status: Home / Self Care | Payer: Medicare Other | Attending: Emergency Medicine | Admitting: Emergency Medicine

## 2018-04-02 ENCOUNTER — Encounter (HOSPITAL_COMMUNITY): Payer: Self-pay | Admitting: Emergency Medicine

## 2018-04-02 DIAGNOSIS — E039 Hypothyroidism, unspecified: Secondary | ICD-10-CM | POA: Diagnosis not present

## 2018-04-02 DIAGNOSIS — J45909 Unspecified asthma, uncomplicated: Secondary | ICD-10-CM | POA: Insufficient documentation

## 2018-04-02 DIAGNOSIS — Z91040 Latex allergy status: Secondary | ICD-10-CM | POA: Insufficient documentation

## 2018-04-02 DIAGNOSIS — Z7982 Long term (current) use of aspirin: Secondary | ICD-10-CM | POA: Insufficient documentation

## 2018-04-02 DIAGNOSIS — R05 Cough: Secondary | ICD-10-CM | POA: Diagnosis not present

## 2018-04-02 DIAGNOSIS — Z79899 Other long term (current) drug therapy: Secondary | ICD-10-CM | POA: Insufficient documentation

## 2018-04-02 DIAGNOSIS — R059 Cough, unspecified: Secondary | ICD-10-CM

## 2018-04-02 MED ORDER — PREDNISONE 10 MG PO TABS: 20.0000 mg | ORAL_TABLET | Freq: Every day | ORAL | 0 refills | Status: DC

## 2018-04-02 MED ORDER — IPRATROPIUM-ALBUTEROL 0.5-2.5 (3) MG/3ML IN SOLN
3.0000 mL | Freq: Once | RESPIRATORY_TRACT | Status: AC
  Administered 2018-04-02: 3 mL via RESPIRATORY_TRACT
  Filled 2018-04-02: qty 3

## 2018-04-02 NOTE — ED Triage Notes (Signed)
Patient complaining of coughing up green sputum x 1 week. States she was seen by PCP last week and given shot of antibiotics. States she went back on Monday and Tuesday and given two more shots of antibiotics and is no better. Patient has audible wheezing at triage.

## 2018-04-02 NOTE — Discharge Instructions (Addendum)
I reviewed your chest x-ray from yesterday.  It was normal.  Continue your nebulizer treatments.  Prescription for prednisone.  Follow up with your primary care doctor.

## 2018-04-02 NOTE — ED Provider Notes (Signed)
[facility] EMERGENCY DEPARTMENT Provider Note   CSN: [id number] Arrival date & time: 04/02/18  1119     History   Chief Complaint Chief Complaint  Patient presents with  . Cough    HPI [name] is a 62 y.o. female.  Chief complaint green sputum for 1 week.  Patient has been seen by her primary care doctor and given intramuscular antibiotics on 3 different occasions.  She states she is still coughing and wheezing.  Chest x-ray obtained yesterday was read as normal.  No fever, chills, rusty sputum.  Severity symptoms is moderate.  Nothing makes symptoms better or worse.     Past Medical History:  Diagnosis Date  . Allergic rhinitis   . Anxiety   . Arthritis   . Asthma   . Atypical chest pain    cath 2007 showing normal coronary arteries, last echo 2003 with normal EF and no systolic dysfunction  . Broken ribs    Trauma falling off of horse November 2012  . Chest pain   . Chronic pain syndrome   . CVA (cerebral infarction)    reported by patient, no documentation  . Depression    history of prior suicide attempts  . DVT (deep venous thrombosis) (HCC)    remote at age 62  . GERD (gastroesophageal reflux disease)   . Headache   . Herniated nucleus pulposus, L4-5 left 2001   s/p Left microsurgical exploration L4-5 and microdiskectomy with lysis  . Hypotension    history of hypotension in the past requiring fluid boluses  . Hypothyroidism   . Mitral valve prolapse   . Mitral valve prolapse   . Pneumonia 08/29/2011  . Pseudoarthrosis of lumbar spine   . Seizures (HCC)    in the 1990's; takes neurontin for this;no seizures since then.  . Shortness of breath   . Stroke (HCC)    hx of TIA  . TIA (transient ischemic attack)     Patient Active Problem List   Diagnosis Date Noted  . Pseudoarthrosis of lumbar spine 11/26/2017  . Acute bronchitis 06/09/2017  . Spondylolisthesis at L4-L5 level 06/05/2016  . Atypical pneumonia 08/29/2011  . Chest pain  08/29/2011  . Dyspnea 03/15/2011  . OPEN WOUND FT NO TOE ALONE WITHOUT MENTION COMP 05/22/2007  . VIRAL INFECTION 05/07/2007  . NASOPHARYNGITIS, ACUTE 05/07/2007  . OBESITY 03/25/2007  . Anxiety state 03/25/2007  . DEPRESSION 03/25/2007  . Chronic pain syndrome 03/25/2007  . MIGRAINE HEADACHE 03/25/2007  . CARPAL TUNNEL SYNDROME 03/25/2007  . NEUROPATHY 03/25/2007  . ABNORMAL HEART RHYTHMS 03/25/2007  . ALLERGIC RHINITIS 03/25/2007  . BRONCHITIS, CHRONIC 03/25/2007  . ASTHMA 03/25/2007  . GERD 03/25/2007  . PUD 03/25/2007  . IBS 03/25/2007  . ARTHRITIS 03/25/2007  . LOW BACK PAIN, CHRONIC 03/25/2007  . FIBROMYALGIA 03/25/2007  . Seizure disorder (HCC) 03/25/2007  . MEMORY LOSS 03/25/2007  . ABDOMINAL PAIN 03/25/2007    Past Surgical History:  Procedure Laterality Date  . ABDOMINAL HYSTERECTOMY    . APPENDECTOMY    . BREAST BIOPSY Right    neg  . CERVICAL SPINE SURGERY    . CHOLECYSTECTOMY    . cholescystectomy  2003    Laparoscopic cholecystectomy with intraoperative  . ELBOW SURGERY Right    3 different surgeries   . INCISIONAL HERNIA REPAIR N/A 01/21/2016   Procedure: HERNIA REPAIR INCISIONAL WITH MESH;  Surgeon: Franky MachoMark Jenkins, MD;  Location: [facility];  Service: General;  Laterality: N/A;  . KNEE SURGERY  Bilateral   . lower back surgery    . NERVE, TENDON AND ARTERY REPAIR Left 05/09/2013   Procedure: LEFT WRIST EXPLORATION ;  Surgeon: Tami Ribas, MD;  Location: [facility];  Service: Orthopedics;  Laterality: Left;  . OOPHORECTOMY    . right arm surgery     carpal tunnel; and tendon repair of elbow.  Marland Kitchen SHOULDER SURGERY Right   . TONSILLECTOMY       OB History   None      Home Medications    Prior to Admission medications   Medication Sig Start Date End Date Taking? Authorizing Provider  albuterol (PROVENTIL HFA;VENTOLIN HFA) 108 (90 BASE) MCG/ACT inhaler Inhale 2 puffs into the lungs every 6 (six) hours as needed for wheezing. 08/30/11    Danley Danker, MD  aspirin EC 81 MG tablet Take 81 mg by mouth every evening.    [provider]  cetirizine (ZYRTEC) 10 MG tablet Take 10 mg by mouth 2 (two) times daily.     [provider]  cyclobenzaprine (FLEXERIL) 10 MG tablet Take 1 tablet (10 mg total) by mouth 2 (two) times daily as needed for muscle spasms. Patient taking differently: Take 10 mg by mouth 3 (three) times daily as needed for muscle spasms.  04/12/15   Alvira Monday, MD  dexamethasone (DECADRON) 1 MG tablet 2 tablets twice daily for 2 days, one tablet twice daily for 2 days, one tablet daily for 2 days. 11/27/17   Barnett Abu, MD  diazepam (VALIUM) 5 MG tablet Take 10 mg by mouth at bedtime.    [provider]  FLUoxetine (PROZAC) 20 MG capsule Take 60 mg by mouth daily.    [provider]  fluticasone (FLONASE) 50 MCG/ACT nasal spray Place 1 spray into both nostrils daily as needed for allergies or rhinitis.    [provider]  gabapentin (NEURONTIN) 300 MG capsule Take 900-1,800 mg by mouth See admin instructions. Takes 900 mg by mouth in the morning, 900 mg by mouth in the afternoon, and 1800 mg by mouth at bedtime.    [provider]  hydrOXYzine (VISTARIL) 25 MG capsule Take 50 mg by mouth every evening.  04/20/13   [provider]  levothyroxine (SYNTHROID, LEVOTHROID) 75 MCG tablet Take 75 mcg by mouth daily before breakfast.    [provider]  LORazepam (ATIVAN) 1 MG tablet Take 1 mg by mouth every 8 (eight) hours as needed for anxiety.     [provider]  meloxicam (MOBIC) 15 MG tablet Take 15 mg by mouth daily.     [provider]  meperidine (DEMEROL) 50 MG tablet Take 1 tablet (50 mg total) by mouth every 6 (six) hours as needed for moderate pain. FOR PAIN 01/21/16   Franky Macho, MD  methocarbamol (ROBAXIN) 500 MG tablet Take 1 tablet (500 mg total) by mouth every 6 (six) hours as needed for muscle spasms. 06/08/16    Barnett Abu, MD  methylphenidate (RITALIN) 10 MG tablet Take 10 mg by mouth 2 (two) times daily as needed (ADD).     [provider]  metoprolol succinate (TOPROL-XL) 50 MG 24 hr tablet Take 50 mg by mouth daily. Take with or immediately following a meal.    [provider]  omeprazole (PRILOSEC) 20 MG capsule Take 1 capsule (20 mg total) by mouth 2 (two) times daily. Patient taking differently: Take 20 mg by mouth 2 (two) times daily before a meal.  02/15/15   Gerhard Munch, MD  predniSONE (DELTASONE) 10 MG tablet Take 2 tablets (20 mg total) by mouth daily. 04/02/18   Donnetta Hutching, MD  ropinirole (REQUIP) 5 MG tablet Take 5 mg by mouth every evening.    [provider]  valACYclovir (VALTREX) 500 MG tablet Take 500 mg by mouth daily as needed (for sun exposure).    [provider]    Family History Family History  Problem Relation Age of Onset  . Heart attack Brother        CABG  . Hypertrophic cardiomyopathy Son   . Cancer Mother        Uterine  . Cancer Father        brain  . Cancer Maternal Aunt        cancer  . Cancer Maternal Aunt        breast  . Cancer Maternal Aunt        breast  . Cancer Cousin        breast  . Cancer Cousin        breast  . Cancer Cousin        breast    Social History Social History   Tobacco Use  . Smoking status: Never Smoker  . Smokeless tobacco: Never Used  Substance Use Topics  . Alcohol use: No    Alcohol/week: 0.0 standard drinks  . Drug use: No     Allergies   Amoxicillin; Ampicillin; Aspirin; Bee venom; Chlorzoxazone; Ciprofloxacin; Heparin; Indomethacin; Nsaids; Penicillins; Sulfa antibiotics; Albumin (human); Cephalexin; Chlorpromazine; Darvocet [propoxyphene n-acetaminophen]; Dilaudid [hydromorphone hcl]; Fentanyl; Hydrocodone; Ketorolac tromethamine; Latex; Nortriptyline hcl; Oxycodone; Pentazocine; Pentazocine-naloxone; Percocet [oxycodone-acetaminophen]; Propoxyphene; Sertraline hcl;  Tramadol hcl; Codeine; Hyoscyamine sulfate; Imipramine; and Tramadol   Review of Systems Review of Systems  All other systems reviewed and are negative.    Physical Exam Updated Vital Signs BP 132/72 (BP Location: Right Arm)   Pulse 93   Temp 98.2 F (36.8 C) (Oral)   Resp 18   Ht 5' (1.524 m)   Wt 92.5 kg   SpO2 93%   BMI 39.84 kg/m   Physical Exam  Constitutional: She is oriented to person, place, and time.  nad  HENT:  Head: Normocephalic and atraumatic.  Eyes: Conjunctivae are normal.  Neck: Neck supple.  Cardiovascular: Normal rate and regular rhythm.  Pulmonary/Chest: Effort normal.  Slight expiratory wheeze  Abdominal: Soft. Bowel sounds are normal.  Musculoskeletal: Normal range of motion.  Neurological: She is alert and oriented to person, place, and time.  Skin: Skin is warm and dry.  Psychiatric: She has a normal mood and affect. Her behavior is normal.  Nursing note and vitals reviewed.    ED Treatments / Results  Labs (all labs ordered are listed, but only abnormal results are displayed) Labs Reviewed - No data to display  EKG None  Radiology Dg Chest 2 View  Result Date: 04/01/2018 CLINICAL DATA:  Productive cough EXAM: CHEST - 2 VIEW COMPARISON:  06/08/2017 FINDINGS: The heart size and mediastinal contours are within normal limits. Both lungs are clear. The visualized skeletal structures are unremarkable. IMPRESSION: No active cardiopulmonary disease. Electronically Signed   By: Marlan Palau M.D.   On: 04/01/2018 13:48    Procedures Procedures (including critical care time)  Medications Ordered in ED Medications  ipratropium-albuterol (DUONEB) 0.5-2.5 (3) MG/3ML nebulizer solution 3 mL (3 mLs Nebulization Given 04/02/18 1237)     Initial Impression / Assessment and Plan / ED Course  I have reviewed the triage vital signs and the nursing notes.  Pertinent labs & imaging results that were available during my care of the patient were  reviewed by me and considered in my medical decision making (see chart for details).     Patient presents with persistent cough and wheezing.  Chest x-ray obtained yesterday showed no pneumonia.  She has been given intramuscular antibiotics on 3 occasions.  Suspect viral etiology because "antibiotics do not seem to be working".  Nebulizer treatment given in the ED.  Discharge medications prednisone.  No resp distress at discharge.  Final Clinical Impressions(s) / ED Diagnoses   Final diagnoses:  Cough    ED Discharge Orders         Ordered    predniSONE (DELTASONE) 10 MG tablet  Daily     04/02/18 1452           Donnetta Hutching, MD 04/02/18 1545

## 2018-05-30 IMAGING — RF DG LUMBAR SPINE 2-3V
1 series · 2 of 2 positions shown · non-contrast
Comparison: 07/09/2015.

CLINICAL DATA: Posterior fusion.

EXAM:
LUMBAR SPINE - 2-3 VIEW; DG C-ARM 61-120 MIN

[Series 1: run · 2 of 2 slices shown]
[im 1/2]
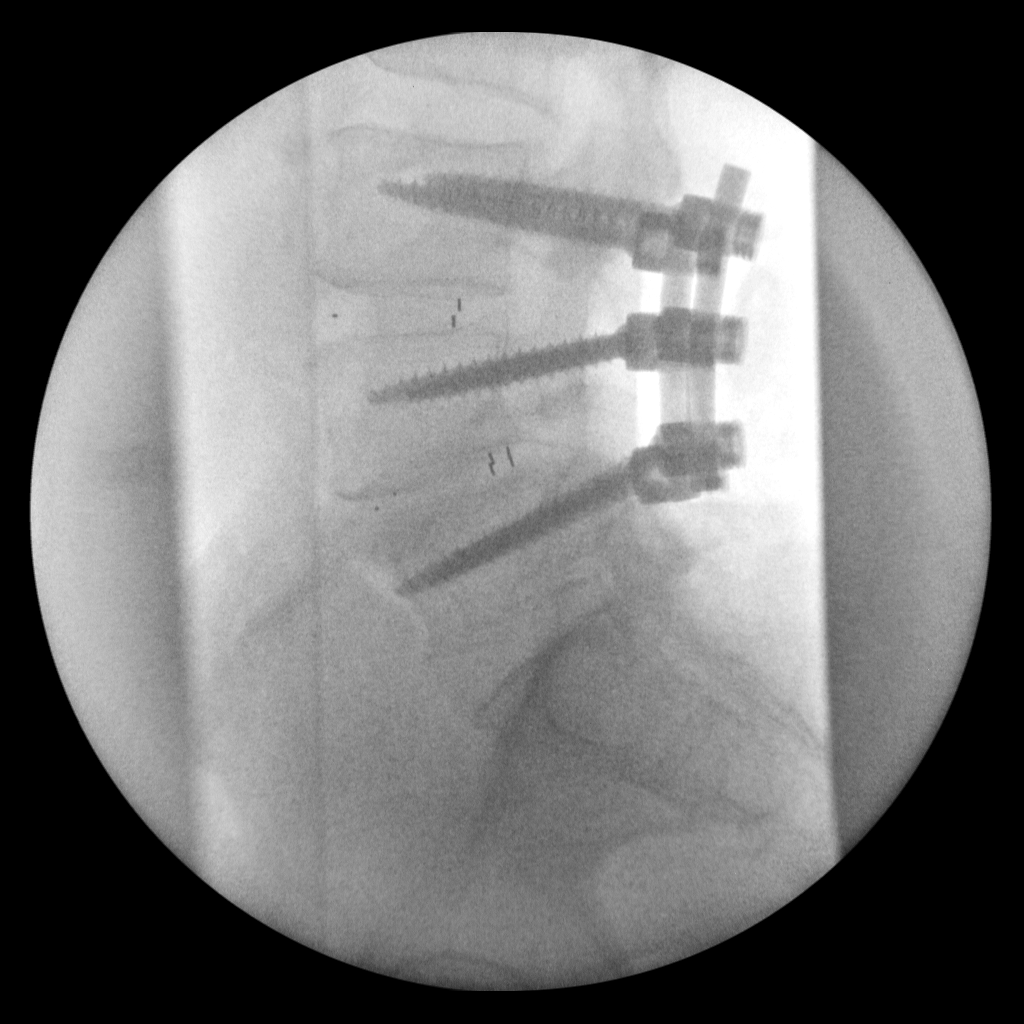
[im 2/2]
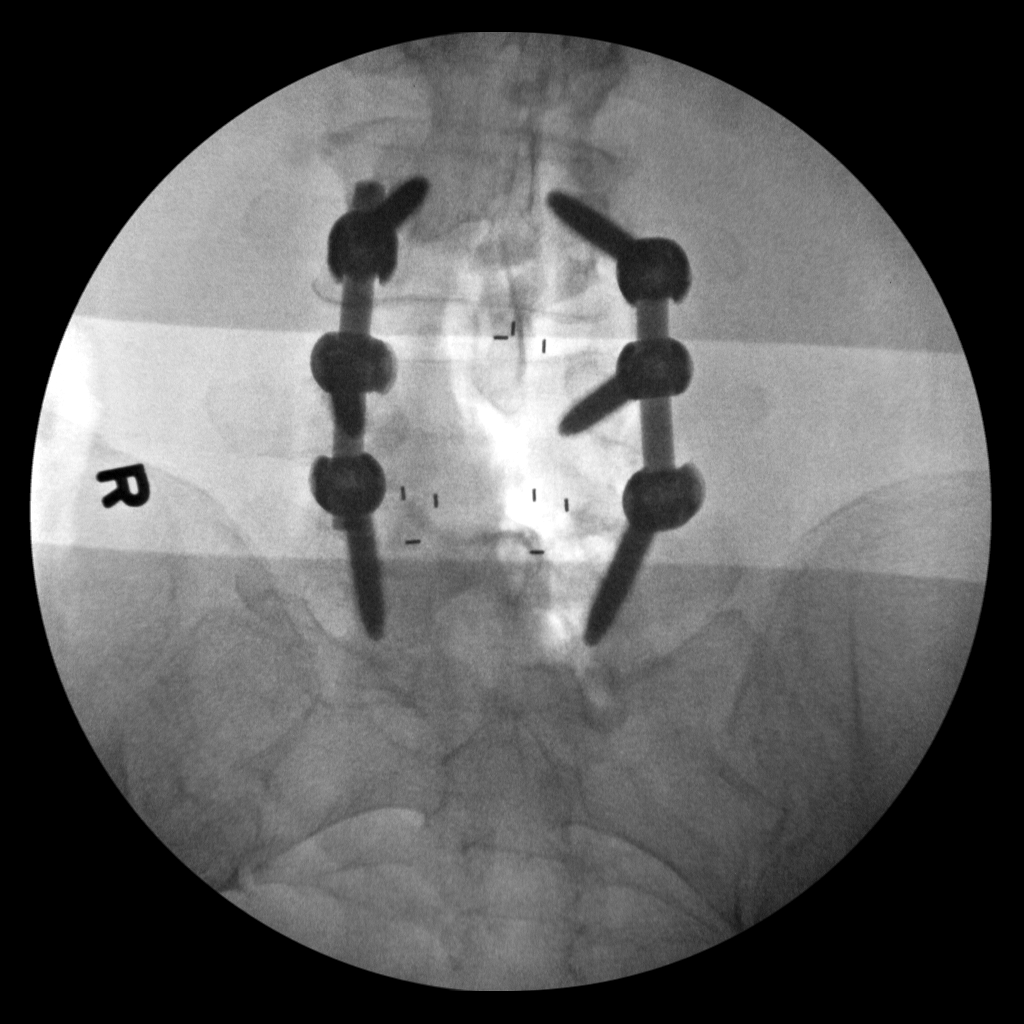

[2 of 2 positions shown; findings below may reference images not displayed]

FINDINGS: L3 through L5 posterior and interbody fusion. Hardware intact.
Stable alignment with mild anterolisthesis L4 on L5 . 0 minutes 56
seconds fluoroscopy time. Two images obtained .
IMPRESSION: Postsurgical changes lumbar spine.

## 2018-10-10 ENCOUNTER — Other Ambulatory Visit (HOSPITAL_COMMUNITY): Payer: Self-pay | Admitting: Family Medicine

## 2018-10-10 DIAGNOSIS — Z1231 Encounter for screening mammogram for malignant neoplasm of breast: Secondary | ICD-10-CM

## 2018-12-06 ENCOUNTER — Other Ambulatory Visit: Payer: Self-pay

## 2018-12-06 DIAGNOSIS — Z20822 Contact with and (suspected) exposure to covid-19: Secondary | ICD-10-CM

## 2018-12-08 LAB — NOVEL CORONAVIRUS, NAA: SARS-CoV-2, NAA: NOT DETECTED

## 2019-01-14 ENCOUNTER — Ambulatory Visit (INDEPENDENT_AMBULATORY_CARE_PROVIDER_SITE_OTHER): Payer: Medicare Other | Admitting: Gastroenterology

## 2019-01-14 ENCOUNTER — Other Ambulatory Visit: Payer: Self-pay | Admitting: *Deleted

## 2019-01-14 ENCOUNTER — Other Ambulatory Visit: Payer: Self-pay

## 2019-01-14 ENCOUNTER — Encounter: Payer: Self-pay | Admitting: Gastroenterology

## 2019-01-14 ENCOUNTER — Encounter: Payer: Self-pay | Admitting: *Deleted

## 2019-01-14 VITALS — BP 137/91 | HR 65 | Temp 97.3°F | Ht 64.0 in | Wt 185.6 lb

## 2019-01-14 DIAGNOSIS — R1011 Right upper quadrant pain: Secondary | ICD-10-CM

## 2019-01-14 DIAGNOSIS — R634 Abnormal weight loss: Secondary | ICD-10-CM

## 2019-01-14 DIAGNOSIS — R112 Nausea with vomiting, unspecified: Secondary | ICD-10-CM | POA: Diagnosis not present

## 2019-01-14 DIAGNOSIS — K219 Gastro-esophageal reflux disease without esophagitis: Secondary | ICD-10-CM | POA: Diagnosis not present

## 2019-01-14 MED ORDER — OMEPRAZOLE 20 MG PO CPDR: 20.0000 mg | DELAYED_RELEASE_CAPSULE | Freq: Two times a day (BID) | ORAL | 1 refills | Status: AC

## 2019-01-14 NOTE — Patient Instructions (Addendum)
1. Stop pantoprazole.  2. Restart omeprazole 20mg  twice daily before a meal. New RX sent to [facility]. 3. Upper endoscopy as scheduled. See separate instructions.

## 2019-01-14 NOTE — Assessment & Plan Note (Signed)
Pleasant 63 y/o female with 6 weeks h/o nocturnal N/V, RUQ /epig pain, 25 pound unintentional weight loss. Chronic GERD typically well controlled with omeprazole 20mg  bid, some flare of symptoms now on pantoprazole. Will have her switch back. As far as N/V, abd pain, weight loss, would recommend EGD with deep sedation in near future. Need to rule out PUD, malignancy.  I have discussed the risks, alternatives, benefits with regards to but not limited to the risk of reaction to medication, bleeding, infection, perforation and the patient is agreeable to proceed. Written consent to be obtained.  Interestingly she denies daytime symptoms of N/V although tells me she is basically only consuming liquids during the day. We discussed possibility of CT to further evaluate her symptoms while awaiting EGD but she declined. Based on EGD findings, she may need CT A/P with contrast as next step.   She also is overdue for colonoscopy, at first she declined due to aunt having complication with perforation and died. Then she requested we do colonoscopy at time of EGD however given N/V, I feel like we need to plan for EGD first and we can regroup later for potential colonoscopy.  Requested recent labs for review.

## 2019-01-14 NOTE — Progress Notes (Signed)
Primary Care Physician:  Lemmie Evens, MD  Primary Gastroenterologist:  Garfield Cornea, MD   Chief Complaint  Patient presents with  . Emesis    after eating/drinking  . Weight Loss    has lost 25 lbs unintentional    HPI:  [name] is a 64 y.o. female here at the request of Dr. Karie Kirks for further evaluation of N/V, 25 pound weight loss.   Patient is not currently seeing a gastroenterologist. She saw Dr. Laural Golden for a colonoscopy back in 2003. Prior to that she saw Dr. Cristina Gong for colonoscopy.   She reports approximately six week history of nocturnal N/V, wakes her up from sleep. She has chronic gerd treated with omeprazole 20mg  BID, with good control of typical heartburn. Saw PCP recently for these symptoms, switched to pantoprazole but now heartburn not controlled well.   Before N/V started, she denies any issues. She notes some mild epig/ruq pain one night and started having vomiting. She typically doesn't eat regularly during day. She eats dinner at 5:30pm every night. Goes to bed several hours later and will wake up with mouth watering and starts vomiting undigested food from dinner. No hematemesis. BMs are regular. Increases stools with her nerves at times. Weighed 210 pounds 6 weeks ago and now 185 pounds.   Doesn't note any particular food triggers or activities that trigger the vomiting. No dysphagia. No melena, brbpr.   H/o H.pylori in the past. Remote EGD years ago, report unavailable. Last TCS in 2003 as outlined below. e egd.    Colonoscopy November 2003 by Dr. Laural Golden.  Few small cecal diverticuli and external hemorrhoids, normal terminal ileoscopy.  FH father, esophageal cancer. Brother may have the same.   PATIENT REPORTS FAILED CONSCIOUS SEDATION IN THE PAST.  Current Outpatient Medications  Medication Sig Dispense Refill  . albuterol (PROVENTIL HFA;VENTOLIN HFA) 108 (90 BASE) MCG/ACT inhaler Inhale 2 puffs into the lungs every 6 (six) hours as needed for  wheezing. 1 Inhaler 0  . cyclobenzaprine (FLEXERIL) 10 MG tablet Take 1 tablet (10 mg total) by mouth 2 (two) times daily as needed for muscle spasms. (Patient taking differently: Take 10 mg by mouth 3 (three) times daily as needed for muscle spasms. ) 20 tablet 0  . diazepam (VALIUM) 5 MG tablet Take 10 mg by mouth at bedtime.    . fluticasone (FLONASE) 50 MCG/ACT nasal spray Place 1 spray into both nostrils daily as needed for allergies or rhinitis.    Marland Kitchen gabapentin (NEURONTIN) 300 MG capsule Take 900-1,800 mg by mouth See admin instructions. Takes 900 mg by mouth in the morning, 900 mg by mouth in the afternoon, and 1800 mg by mouth at bedtime.    . hydrOXYzine (VISTARIL) 25 MG capsule Take 50 mg by mouth every evening.     Marland Kitchen levothyroxine (SYNTHROID, LEVOTHROID) 75 MCG tablet Take 75 mcg by mouth daily before breakfast.    . LORazepam (ATIVAN) 1 MG tablet Take 1 mg by mouth every 8 (eight) hours as needed for anxiety.     . meloxicam (MOBIC) 15 MG tablet Take 15 mg by mouth daily.     . methocarbamol (ROBAXIN) 500 MG tablet Take 1 tablet (500 mg total) by mouth every 6 (six) hours as needed for muscle spasms. 60 tablet 3  . methylphenidate (RITALIN) 10 MG tablet Take 10 mg by mouth 2 (two) times daily as needed (ADD).     Marland Kitchen metoprolol succinate (TOPROL-XL) 50 MG 24 hr tablet Take 50 mg  by mouth daily. Take with or immediately following a meal.    . valACYclovir (VALTREX) 500 MG tablet Take 500 mg by mouth daily as needed (for sun exposure).    Marland Kitchen. omeprazole (PRILOSEC) 20 MG capsule Take 1 capsule (20 mg total) by mouth 2 (two) times daily before a meal. 180 capsule 1   No current facility-administered medications for this visit.     Allergies as of 01/14/2019 - Review Complete 01/14/2019  Allergen Reaction Noted  . Amoxicillin Hives, Rash, and Other (See Comments) 03/25/2007  . Ampicillin Rash and Other (See Comments) 03/25/2007  . Aspirin Other (See Comments) 03/25/2007  . Bee venom  Anaphylaxis 10/02/2017  . Chlorzoxazone Rash and Other (See Comments) 02/15/2015  . Ciprofloxacin Anaphylaxis and Other (See Comments) 03/25/2007  . Heparin Other (See Comments) 03/12/2011  . Indomethacin Other (See Comments) 03/12/2011  . Nsaids Other (See Comments) 03/25/2007  . Penicillins Hives, Shortness Of Breath, Swelling, and Other (See Comments) 11/15/2016  . Sulfa antibiotics Other (See Comments) 06/08/2016  . Albumin (human) Rash and Other (See Comments) 02/15/2015  . Cephalexin Hives, Itching, and Rash 03/25/2007  . Chlorpromazine Rash and Other (See Comments) 02/15/2015  . Darvocet [propoxyphene n-acetaminophen] Itching, Nausea And Vomiting, and Rash 03/25/2007  . Dilaudid [hydromorphone hcl] Itching, Nausea And Vomiting, and Swelling 06/06/2013  . Fentanyl Hives and Nausea And Vomiting 03/12/2011  . Hydrocodone Nausea And Vomiting   . Ketorolac tromethamine Itching, Nausea And Vomiting, and Rash 03/25/2007  . Latex Hives 11/15/2016  . Nortriptyline hcl Itching, Rash, and Other (See Comments) 03/25/2007  . Oxycodone Nausea And Vomiting 03/12/2011  . Pentazocine Nausea And Vomiting and Rash 02/15/2015  . Pentazocine-naloxone Nausea And Vomiting 03/25/2007  . Percocet [oxycodone-acetaminophen] Itching, Nausea And Vomiting, and Rash 03/25/2007  . Propoxyphene Itching, Nausea And Vomiting, and Rash 02/15/2015  . Sertraline hcl Other (See Comments) 03/25/2007  . Tramadol hcl Nausea And Vomiting 03/25/2007  . Codeine Rash 02/15/2015  . Hyoscyamine sulfate Other (See Comments) 03/25/2007  . Imipramine Itching and Rash 02/15/2015  . Tramadol Rash 02/15/2015    Past Medical History:  Diagnosis Date  . Allergic rhinitis   . Anxiety   . Arthritis   . Asthma   . Atypical chest pain    cath 2007 showing normal coronary arteries, last echo 2003 with normal EF and no systolic dysfunction  . Broken ribs    Trauma falling off of horse November 2012  . Chest pain   . Chronic pain  syndrome   . CVA (cerebral infarction)    reported by patient, no documentation  . Depression    history of prior suicide attempts  . DVT (deep venous thrombosis) (HCC)    remote at age 63  . GERD (gastroesophageal reflux disease)   . Headache   . Herniated nucleus pulposus, L4-5 left 2001   s/p Left microsurgical exploration L4-5 and microdiskectomy with lysis  . Hypotension    history of hypotension in the past requiring fluid boluses  . Hypothyroidism   . Mitral valve prolapse   . Mitral valve prolapse   . Pneumonia 08/29/2011  . Pseudoarthrosis of lumbar spine   . Seizures (HCC)    in the 1990's; takes neurontin for this;no seizures since then.  . Shortness of breath   . Stroke (HCC)    hx of TIA  . TIA (transient ischemic attack)     Past Surgical History:  Procedure Laterality Date  . ABDOMINAL HYSTERECTOMY    . APPENDECTOMY    .  BACK SURGERY  05/2016  . BACK SURGERY  11/2017  . BREAST BIOPSY Right    neg  . CERVICAL SPINE SURGERY    . CHOLECYSTECTOMY    . cholescystectomy  2003    Laparoscopic cholecystectomy with intraoperative  . ELBOW SURGERY Right    3 different surgeries   . INCISIONAL HERNIA REPAIR N/A 01/21/2016   Procedure: HERNIA REPAIR INCISIONAL WITH MESH;  Surgeon: Franky MachoMark Jenkins, MD;  Location: [facility];  Service: General;  Laterality: N/A;  . KNEE SURGERY Bilateral   . lower back surgery    . NERVE, TENDON AND ARTERY REPAIR Left 05/09/2013   Procedure: LEFT WRIST EXPLORATION ;  Surgeon: Tami RibasKevin R Kuzma, MD;  Location: [facility];  Service: Orthopedics;  Laterality: Left;  . OOPHORECTOMY    . right arm surgery     carpal tunnel; and tendon repair of elbow.  Marland Kitchen. SHOULDER SURGERY Right   . TONSILLECTOMY      Family History  Problem Relation Age of Onset  . Heart attack Brother        CABG  . Hypertrophic cardiomyopathy Son   . Cancer Mother        Uterine  . Cirrhosis Mother   . Alcohol abuse Mother   . Cancer Father        brain  .  Esophageal cancer Father   . Alcohol abuse Father   . Cancer Maternal Aunt        cancer  . Cancer Maternal Aunt        breast  . Cancer Maternal Aunt        breast  . Cancer Cousin        breast  . Cancer Cousin        breast  . Cancer Cousin        breast  . Other Other        died due to perforated colon after colonoscopy    Social History   Socioeconomic History  . Marital status: Divorced    Spouse name: Not on file  . Number of children: 3  . Years of education: Not on file  . Highest education level: Not on file  Occupational History  . Occupation: Disability  Social Needs  . Financial resource strain: Not on file  . Food insecurity    Worry: Not on file    Inability: Not on file  . Transportation needs    Medical: Not on file    Non-medical: Not on file  Tobacco Use  . Smoking status: Never Smoker  . Smokeless tobacco: Never Used  Substance and Sexual Activity  . Alcohol use: No    Alcohol/week: 0.0 standard drinks  . Drug use: No  . Sexual activity: Yes    Birth control/protection: Post-menopausal, Surgical  Lifestyle  . Physical activity    Days per week: Not on file    Minutes per session: Not on file  . Stress: Not on file  Relationships  . Social Musicianconnections    Talks on phone: Not on file    Gets together: Not on file    Attends religious service: Not on file    Active member of club or organization: Not on file    Attends meetings of clubs or organizations: Not on file    Relationship status: Not on file  . Intimate partner violence    Fear of current or ex partner: Not on file    Emotionally abused: Not on  file    Physically abused: Not on file    Forced sexual activity: Not on file  Other Topics Concern  . Not on file  Social History Narrative   Has 2 sons and 1 daughter   Lives with husband in [place]      ROS:  General: Negative for anorexia, weight loss, fever, chills, fatigue, weakness. Eyes: Negative for vision changes.   ENT: Negative for hoarseness, difficulty swallowing , nasal congestion. CV: Negative for chest pain, angina, palpitations, dyspnea on exertion, peripheral edema.  Respiratory: Negative for dyspnea at rest, dyspnea on exertion, cough, sputum, wheezing.  GI: See history of present illness. GU:  Negative for dysuria, hematuria, urinary incontinence, urinary frequency, nocturnal urination.  MS: Negative for joint pain, low back pain.  Derm: Negative for rash or itching.  Neuro: Negative for weakness, abnormal sensation, seizure, frequent headaches, memory loss, confusion.  Psych: Negative for anxiety, depression, suicidal ideation, hallucinations.  Endo: Negative for unusual weight change.  Heme: Negative for bruising or bleeding. Allergy: Negative for rash or hives.    Physical Examination:  BP (!) 137/91   Pulse 65   Temp (!) 97.3 F (36.3 C) (Temporal)   Ht 5\' 4"  (1.626 m)   Wt 185 lb 9.6 oz (84.2 kg)   BMI 31.86 kg/m    General: Well-nourished, well-developed in no acute distress.  Head: Normocephalic, atraumatic.   Eyes: Conjunctiva pink, no icterus. Mouth: Oropharyngeal mucosa moist and pink , no lesions erythema or exudate. Neck: Supple without thyromegaly, masses, or lymphadenopathy.  Lungs: Clear to auscultation bilaterally.  Heart: Regular rate and rhythm, no murmurs rubs or gallops.  Abdomen: Bowel sounds are normal, mild ruq tenderness, nondistended, no hepatosplenomegaly or masses, no abdominal bruits or    hernia , no rebound or guarding.   Rectal: not performed Extremities: No lower extremity edema. No clubbing or deformities.  Neuro: Alert and oriented x 4 , grossly normal neurologically.  Skin: Warm and dry, no rash or jaundice.   Psych: Alert and cooperative, normal mood and affect.  Labs: requested  Imaging Studies: No results found.

## 2019-01-15 ENCOUNTER — Encounter: Payer: Self-pay | Admitting: *Deleted

## 2019-01-17 NOTE — Progress Notes (Signed)
Labs from November 29, 2018: Glucose 96, BUN 14, creatinine 1.77, total bilirubin 0.4, alkaline phosphatase 88, AST 24, ALT 20, albumin 4, TSH 0.62, white blood cell count 8300, hemoglobin 14.1, platelets 321,000.

## 2019-03-04 ENCOUNTER — Telehealth: Payer: Self-pay | Admitting: *Deleted

## 2019-03-04 NOTE — Telephone Encounter (Signed)
Received call from [facility] in endo patient wants to cancel procedure scheduled for 11/2.   Called patient and confirmed she wants to cancel. She states d/t COVID-19 she doesn't want to go anywhere near a hospital right now. FYI to [facility].

## 2019-03-05 NOTE — Telephone Encounter (Signed)
Noted  

## 2019-03-07 ENCOUNTER — Other Ambulatory Visit (HOSPITAL_COMMUNITY): Payer: Medicare Other

## 2019-03-07 ENCOUNTER — Encounter (HOSPITAL_COMMUNITY): Admission: RE | Admit: 2019-03-07 | Payer: Medicare Other | Source: Ambulatory Visit

## 2019-03-10 ENCOUNTER — Encounter (HOSPITAL_COMMUNITY): Payer: Self-pay

## 2019-03-10 ENCOUNTER — Ambulatory Visit (HOSPITAL_COMMUNITY): Admit: 2019-03-10 | Payer: Medicare Other | Admitting: Internal Medicine

## 2019-03-10 SURGERY — ESOPHAGOGASTRODUODENOSCOPY (EGD) WITH PROPOFOL
Anesthesia: Monitor Anesthesia Care

## 2019-04-06 ENCOUNTER — Other Ambulatory Visit: Payer: Self-pay

## 2019-04-06 ENCOUNTER — Emergency Department (HOSPITAL_COMMUNITY)
Admission: EM | Admit: 2019-04-06 | Discharge: 2019-04-06 | Disposition: A | Discharge status: Home / Self Care | Payer: Medicare Other | Attending: Emergency Medicine | Admitting: Emergency Medicine

## 2019-04-06 ENCOUNTER — Emergency Department (HOSPITAL_COMMUNITY): Payer: Medicare Other

## 2019-04-06 ENCOUNTER — Encounter (HOSPITAL_COMMUNITY): Payer: Self-pay | Admitting: *Deleted

## 2019-04-06 DIAGNOSIS — Z8673 Personal history of transient ischemic attack (TIA), and cerebral infarction without residual deficits: Secondary | ICD-10-CM | POA: Insufficient documentation

## 2019-04-06 DIAGNOSIS — Z79899 Other long term (current) drug therapy: Secondary | ICD-10-CM | POA: Diagnosis not present

## 2019-04-06 DIAGNOSIS — R55 Syncope and collapse: Secondary | ICD-10-CM | POA: Diagnosis not present

## 2019-04-06 DIAGNOSIS — E861 Hypovolemia: Secondary | ICD-10-CM | POA: Diagnosis not present

## 2019-04-06 DIAGNOSIS — R109 Unspecified abdominal pain: Secondary | ICD-10-CM | POA: Diagnosis not present

## 2019-04-06 DIAGNOSIS — I9589 Other hypotension: Secondary | ICD-10-CM | POA: Insufficient documentation

## 2019-04-06 DIAGNOSIS — E860 Dehydration: Secondary | ICD-10-CM | POA: Diagnosis not present

## 2019-04-06 DIAGNOSIS — E039 Hypothyroidism, unspecified: Secondary | ICD-10-CM | POA: Insufficient documentation

## 2019-04-06 DIAGNOSIS — Z86718 Personal history of other venous thrombosis and embolism: Secondary | ICD-10-CM | POA: Insufficient documentation

## 2019-04-06 DIAGNOSIS — M791 Myalgia, unspecified site: Secondary | ICD-10-CM | POA: Insufficient documentation

## 2019-04-06 DIAGNOSIS — J45909 Unspecified asthma, uncomplicated: Secondary | ICD-10-CM | POA: Insufficient documentation

## 2019-04-06 DIAGNOSIS — R569 Unspecified convulsions: Secondary | ICD-10-CM | POA: Diagnosis present

## 2019-04-06 LAB — CBC WITH DIFFERENTIAL/PLATELET
Abs Immature Granulocytes: 0.03 10*3/uL (ref 0.00–0.07)
Basophils Absolute: 0 10*3/uL (ref 0.0–0.1)
Basophils Relative: 0 %
Eosinophils Absolute: 0.1 10*3/uL (ref 0.0–0.5)
Eosinophils Relative: 1 %
HCT: 42.1 % (ref 36.0–46.0)
Hemoglobin: 13.3 g/dL (ref 12.0–15.0)
Immature Granulocytes: 0 %
Lymphocytes Relative: 20 %
Lymphs Abs: 1.9 10*3/uL (ref 0.7–4.0)
MCH: 29.5 pg (ref 26.0–34.0)
MCHC: 31.6 g/dL (ref 30.0–36.0)
MCV: 93.3 fL (ref 80.0–100.0)
Monocytes Absolute: 0.8 10*3/uL (ref 0.1–1.0)
Monocytes Relative: 9 %
Neutro Abs: 6.4 10*3/uL (ref 1.7–7.7)
Neutrophils Relative %: 70 %
Platelets: 300 10*3/uL (ref 150–400)
RBC: 4.51 MIL/uL (ref 3.87–5.11)
RDW: 13.1 % (ref 11.5–15.5)
WBC: 9.3 10*3/uL (ref 4.0–10.5)
nRBC: 0 % (ref 0.0–0.2)

## 2019-04-06 LAB — I-STAT CHEM 8, ED
BUN: 15 mg/dL (ref 8–23)
Calcium, Ion: 1.08 mmol/L — ABNORMAL LOW (ref 1.15–1.40)
Chloride: 105 mmol/L (ref 98–111)
Creatinine, Ser: 1.2 mg/dL — ABNORMAL HIGH (ref 0.44–1.00)
Glucose, Bld: 100 mg/dL — ABNORMAL HIGH (ref 70–99)
HCT: 41 % (ref 36.0–46.0)
Hemoglobin: 13.9 g/dL (ref 12.0–15.0)
Potassium: 3.3 mmol/L — ABNORMAL LOW (ref 3.5–5.1)
Sodium: 141 mmol/L (ref 135–145)
TCO2: 22 mmol/L (ref 22–32)

## 2019-04-06 LAB — COMPREHENSIVE METABOLIC PANEL
ALT: 27 U/L (ref 0–44)
AST: 28 U/L (ref 15–41)
Albumin: 3.6 g/dL (ref 3.5–5.0)
Alkaline Phosphatase: 72 U/L (ref 38–126)
Anion gap: 14 (ref 5–15)
BUN: 16 mg/dL (ref 8–23)
CO2: 22 mmol/L (ref 22–32)
Calcium: 8.4 mg/dL — ABNORMAL LOW (ref 8.9–10.3)
Chloride: 104 mmol/L (ref 98–111)
Creatinine, Ser: 1.24 mg/dL — ABNORMAL HIGH (ref 0.44–1.00)
GFR calc Af Amer: 54 mL/min — ABNORMAL LOW (ref >60)
GFR calc non Af Amer: 47 mL/min — ABNORMAL LOW (ref >60)
Glucose, Bld: 107 mg/dL — ABNORMAL HIGH (ref 70–99)
Potassium: 3.2 mmol/L — ABNORMAL LOW (ref 3.5–5.1)
Sodium: 140 mmol/L (ref 135–145)
Total Bilirubin: 0.5 mg/dL (ref 0.3–1.2)
Total Protein: 6.3 g/dL — ABNORMAL LOW (ref 6.5–8.1)

## 2019-04-06 LAB — URINALYSIS, ROUTINE W REFLEX MICROSCOPIC
Bacteria, UA: NONE SEEN
Bilirubin Urine: NEGATIVE
Glucose, UA: NEGATIVE mg/dL
Hgb urine dipstick: NEGATIVE
Ketones, ur: NEGATIVE mg/dL
Leukocytes,Ua: NEGATIVE
Nitrite: NEGATIVE
Protein, ur: 30 mg/dL — AB
Specific Gravity, Urine: >1.046 — ABNORMAL HIGH (ref 1.005–1.030)
pH: 5 (ref 5.0–8.0)

## 2019-04-06 LAB — LIPASE, BLOOD: Lipase: 15 U/L (ref 11–51)

## 2019-04-06 LAB — LACTIC ACID, PLASMA
Lactic Acid, Venous: 3.4 mmol/L (ref 0.5–1.9)
Lactic Acid, Venous: 2.5 mmol/L (ref 0.5–1.9)

## 2019-04-06 LAB — TROPONIN I (HIGH SENSITIVITY)
Troponin I (High Sensitivity): 6 ng/L (ref <18)
Troponin I (High Sensitivity): 5 ng/L (ref <18)

## 2019-04-06 MED ORDER — ONDANSETRON HCL 4 MG/2ML IJ SOLN
4.0000 mg | Freq: Once | INTRAMUSCULAR | Status: AC
  Administered 2019-04-06: 4 mg via INTRAVENOUS
  Filled 2019-04-06: qty 2

## 2019-04-06 MED ORDER — MORPHINE SULFATE (PF) 4 MG/ML IV SOLN
4.0000 mg | Freq: Once | INTRAVENOUS | Status: AC
  Administered 2019-04-06: 4 mg via INTRAVENOUS
  Filled 2019-04-06: qty 1

## 2019-04-06 MED ORDER — SODIUM CHLORIDE 0.9 % IV BOLUS
1000.0000 mL | Freq: Once | INTRAVENOUS | Status: AC
  Administered 2019-04-06: 1000 mL via INTRAVENOUS

## 2019-04-06 MED ORDER — DIPHENHYDRAMINE HCL 50 MG/ML IJ SOLN
25.0000 mg | Freq: Once | INTRAMUSCULAR | Status: AC
  Administered 2019-04-06: 25 mg via INTRAVENOUS
  Filled 2019-04-06: qty 1

## 2019-04-06 MED ORDER — IOHEXOL 300 MG/ML  SOLN
100.0000 mL | Freq: Once | INTRAMUSCULAR | Status: AC | PRN
  Administered 2019-04-06: 100 mL via INTRAVENOUS
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
## 2019-04-06 NOTE — ED Notes (Signed)
Pt ambulated in bathroom from toilet to sink and from wheelchair into room, pt c/o of some dizziness when ambulating.

## 2019-04-06 NOTE — ED Notes (Signed)
Date and time results received: 04/06/19 1310 (use smartphrase ".now" to insert current time)  Test: lac acid Critical Value: 3.4 Name of Provider Notified: [email or web address]  Orders Received? Or Actions Taken?: see chart
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
## 2019-04-06 NOTE — ED Notes (Signed)
Patient stated she is unable to void
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
## 2019-04-07 ENCOUNTER — Observation Stay (HOSPITAL_COMMUNITY)
Admission: EM | Admit: 2019-04-07 | Discharge: 2019-04-08 | Disposition: A | Discharge status: Home / Self Care | Payer: Medicare Other | Attending: Family Medicine | Admitting: Family Medicine

## 2019-04-07 ENCOUNTER — Encounter (HOSPITAL_COMMUNITY): Payer: Self-pay | Admitting: Emergency Medicine

## 2019-04-07 ENCOUNTER — Telehealth (HOSPITAL_BASED_OUTPATIENT_CLINIC_OR_DEPARTMENT_OTHER): Payer: Self-pay | Admitting: *Deleted

## 2019-04-07 ENCOUNTER — Other Ambulatory Visit: Payer: Self-pay

## 2019-04-07 DIAGNOSIS — N179 Acute kidney failure, unspecified: Secondary | ICD-10-CM | POA: Insufficient documentation

## 2019-04-07 DIAGNOSIS — G40909 Epilepsy, unspecified, not intractable, without status epilepticus: Secondary | ICD-10-CM

## 2019-04-07 DIAGNOSIS — Z882 Allergy status to sulfonamides status: Secondary | ICD-10-CM | POA: Insufficient documentation

## 2019-04-07 DIAGNOSIS — Z8673 Personal history of transient ischemic attack (TIA), and cerebral infarction without residual deficits: Secondary | ICD-10-CM | POA: Diagnosis not present

## 2019-04-07 DIAGNOSIS — G894 Chronic pain syndrome: Principal | ICD-10-CM | POA: Insufficient documentation

## 2019-04-07 DIAGNOSIS — K219 Gastro-esophageal reflux disease without esophagitis: Secondary | ICD-10-CM | POA: Diagnosis not present

## 2019-04-07 DIAGNOSIS — E039 Hypothyroidism, unspecified: Secondary | ICD-10-CM | POA: Diagnosis not present

## 2019-04-07 DIAGNOSIS — Z880 Allergy status to penicillin: Secondary | ICD-10-CM | POA: Diagnosis not present

## 2019-04-07 DIAGNOSIS — R7881 Bacteremia: Secondary | ICD-10-CM

## 2019-04-07 DIAGNOSIS — K589 Irritable bowel syndrome without diarrhea: Secondary | ICD-10-CM | POA: Diagnosis not present

## 2019-04-07 DIAGNOSIS — Z79899 Other long term (current) drug therapy: Secondary | ICD-10-CM | POA: Diagnosis not present

## 2019-04-07 DIAGNOSIS — Z20828 Contact with and (suspected) exposure to other viral communicable diseases: Secondary | ICD-10-CM | POA: Insufficient documentation

## 2019-04-07 DIAGNOSIS — I341 Nonrheumatic mitral (valve) prolapse: Secondary | ICD-10-CM | POA: Diagnosis not present

## 2019-04-07 DIAGNOSIS — M545 Low back pain, unspecified: Secondary | ICD-10-CM | POA: Diagnosis present

## 2019-04-07 LAB — CBC WITH DIFFERENTIAL/PLATELET
Abs Immature Granulocytes: 0.02 10*3/uL (ref 0.00–0.07)
Basophils Absolute: 0 10*3/uL (ref 0.0–0.1)
Basophils Relative: 0 %
Eosinophils Absolute: 0.2 10*3/uL (ref 0.0–0.5)
Eosinophils Relative: 3 %
HCT: 41.3 % (ref 36.0–46.0)
Hemoglobin: 12.9 g/dL (ref 12.0–15.0)
Immature Granulocytes: 0 %
Lymphocytes Relative: 31 %
Lymphs Abs: 2.3 10*3/uL (ref 0.7–4.0)
MCH: 29.1 pg (ref 26.0–34.0)
MCHC: 31.2 g/dL (ref 30.0–36.0)
MCV: 93 fL (ref 80.0–100.0)
Monocytes Absolute: 0.6 10*3/uL (ref 0.1–1.0)
Monocytes Relative: 8 %
Neutro Abs: 4.3 10*3/uL (ref 1.7–7.7)
Neutrophils Relative %: 58 %
Platelets: 298 10*3/uL (ref 150–400)
RBC: 4.44 MIL/uL (ref 3.87–5.11)
RDW: 13.3 % (ref 11.5–15.5)
WBC: 7.4 10*3/uL (ref 4.0–10.5)
nRBC: 0 % (ref 0.0–0.2)

## 2019-04-07 LAB — COMPREHENSIVE METABOLIC PANEL
ALT: 26 U/L (ref 0–44)
AST: 26 U/L (ref 15–41)
Albumin: 3.6 g/dL (ref 3.5–5.0)
Alkaline Phosphatase: 67 U/L (ref 38–126)
Anion gap: 7 (ref 5–15)
BUN: 14 mg/dL (ref 8–23)
CO2: 26 mmol/L (ref 22–32)
Calcium: 8.3 mg/dL — ABNORMAL LOW (ref 8.9–10.3)
Chloride: 105 mmol/L (ref 98–111)
Creatinine, Ser: 0.72 mg/dL (ref 0.44–1.00)
GFR calc Af Amer: >60 mL/min (ref >60)
GFR calc non Af Amer: >60 mL/min (ref >60)
Glucose, Bld: 96 mg/dL (ref 70–99)
Potassium: 3.6 mmol/L (ref 3.5–5.1)
Sodium: 138 mmol/L (ref 135–145)
Total Bilirubin: 0.4 mg/dL (ref 0.3–1.2)
Total Protein: 6.3 g/dL — ABNORMAL LOW (ref 6.5–8.1)

## 2019-04-07 LAB — LACTIC ACID, PLASMA
Lactic Acid, Venous: 1.1 mmol/L (ref 0.5–1.9)
Lactic Acid, Venous: 1.2 mmol/L (ref 0.5–1.9)

## 2019-04-07 LAB — HIV ANTIBODY (ROUTINE TESTING W REFLEX): HIV Screen 4th Generation wRfx: NONREACTIVE

## 2019-04-07 MED ORDER — GABAPENTIN 300 MG PO CAPS
1800.0000 mg | ORAL_CAPSULE | Freq: Every day | ORAL | Status: DC
  Administered 2019-04-07: 1800 mg via ORAL
  Filled 2019-04-07: qty 6

## 2019-04-07 MED ORDER — FLUOXETINE HCL 20 MG PO CAPS
60.0000 mg | ORAL_CAPSULE | Freq: Every morning | ORAL | Status: DC
  Administered 2019-04-08: 60 mg via ORAL
  Filled 2019-04-07: qty 3

## 2019-04-07 MED ORDER — VANCOMYCIN HCL IN DEXTROSE 1-5 GM/200ML-% IV SOLN
1000.0000 mg | Freq: Two times a day (BID) | INTRAVENOUS | Status: DC
  Administered 2019-04-08 (×2): 1000 mg via INTRAVENOUS
  Filled 2019-04-07 (×4): qty 200

## 2019-04-07 MED ORDER — LORAZEPAM 1 MG PO TABS: 1.0000 mg | ORAL_TABLET | Freq: Three times a day (TID) | ORAL | Status: DC | PRN

## 2019-04-07 MED ORDER — METOPROLOL SUCCINATE ER 50 MG PO TB24
50.0000 mg | ORAL_TABLET | Freq: Every day | ORAL | Status: DC
  Administered 2019-04-07 – 2019-04-08 (×2): 50 mg via ORAL
  Filled 2019-04-07 (×2): qty 1

## 2019-04-07 MED ORDER — VANCOMYCIN HCL 1.5 G IV SOLR
1500.0000 mg | Freq: Once | INTRAVENOUS | Status: AC
  Administered 2019-04-07: 1500 mg via INTRAVENOUS
  Filled 2019-04-07: qty 1500

## 2019-04-07 MED ORDER — LEVOTHYROXINE SODIUM 75 MCG PO TABS
75.0000 ug | ORAL_TABLET | Freq: Every day | ORAL | Status: DC
  Administered 2019-04-08: 75 ug via ORAL
  Filled 2019-04-07: qty 1

## 2019-04-07 MED ORDER — CYCLOBENZAPRINE HCL 10 MG PO TABS: 10.0000 mg | ORAL_TABLET | Freq: Three times a day (TID) | ORAL | Status: DC | PRN

## 2019-04-07 MED ORDER — METHYLPHENIDATE HCL 5 MG PO TABS: 10.0000 mg | ORAL_TABLET | Freq: Two times a day (BID) | ORAL | Status: DC | PRN

## 2019-04-07 MED ORDER — GABAPENTIN 300 MG PO CAPS
900.0000 mg | ORAL_CAPSULE | Freq: Two times a day (BID) | ORAL | Status: DC
  Administered 2019-04-07 – 2019-04-08 (×3): 900 mg via ORAL
  Filled 2019-04-07 (×3): qty 3

## 2019-04-07 MED ORDER — ALBUTEROL SULFATE HFA 108 (90 BASE) MCG/ACT IN AERS
2.0000 | INHALATION_SPRAY | Freq: Four times a day (QID) | RESPIRATORY_TRACT | Status: DC | PRN
  Filled 2019-04-07: qty 6.7

## 2019-04-07 MED ORDER — MORPHINE SULFATE (PF) 2 MG/ML IV SOLN
1.0000 mg | INTRAVENOUS | Status: DC | PRN
  Administered 2019-04-07 – 2019-04-08 (×5): 1 mg via INTRAVENOUS
  Filled 2019-04-07 (×5): qty 1

## 2019-04-07 MED ORDER — FLUTICASONE PROPIONATE 50 MCG/ACT NA SUSP: 1.0000 | Freq: Every day | NASAL | Status: DC | PRN

## 2019-04-07 MED ORDER — PANTOPRAZOLE SODIUM 40 MG PO TBEC
40.0000 mg | DELAYED_RELEASE_TABLET | Freq: Every day | ORAL | Status: DC
  Administered 2019-04-07 – 2019-04-08 (×2): 40 mg via ORAL
  Filled 2019-04-07 (×2): qty 1

## 2019-04-07 MED ORDER — SENNOSIDES-DOCUSATE SODIUM 8.6-50 MG PO TABS: 1.0000 | ORAL_TABLET | Freq: Every evening | ORAL | Status: DC | PRN

## 2019-04-07 MED ORDER — SODIUM CHLORIDE 0.9 % IV BOLUS
1000.0000 mL | Freq: Once | INTRAVENOUS | Status: AC
  Administered 2019-04-07: 1000 mL via INTRAVENOUS

## 2019-04-07 MED ORDER — ACETAMINOPHEN 650 MG RE SUPP: 650.0000 mg | Freq: Four times a day (QID) | RECTAL | Status: DC | PRN

## 2019-04-07 MED ORDER — SODIUM CHLORIDE 0.9 % IV SOLN
INTRAVENOUS | Status: DC
  Administered 2019-04-07 – 2019-04-08 (×4): via INTRAVENOUS

## 2019-04-07 MED ORDER — ACETAMINOPHEN 325 MG PO TABS
650.0000 mg | ORAL_TABLET | Freq: Once | ORAL | Status: DC
  Filled 2019-04-07: qty 2

## 2019-04-07 MED ORDER — ACETAMINOPHEN 325 MG PO TABS: 650.0000 mg | ORAL_TABLET | Freq: Four times a day (QID) | ORAL | Status: DC | PRN

## 2019-04-07 MED ORDER — MELOXICAM 7.5 MG PO TABS
15.0000 mg | ORAL_TABLET | Freq: Every day | ORAL | Status: DC
  Administered 2019-04-07 – 2019-04-08 (×2): 15 mg via ORAL
  Filled 2019-04-07 (×2): qty 2

## 2019-04-07 MED ORDER — HYDROXYZINE HCL 25 MG PO TABS
50.0000 mg | ORAL_TABLET | Freq: Every evening | ORAL | Status: DC
  Administered 2019-04-07: 50 mg via ORAL
  Filled 2019-04-07: qty 2

## 2019-04-07 MED ORDER — METHOCARBAMOL 500 MG PO TABS
500.0000 mg | ORAL_TABLET | Freq: Once | ORAL | Status: AC
  Administered 2019-04-07: 500 mg via ORAL
  Filled 2019-04-07: qty 1

## 2019-04-07 MED ORDER — ONDANSETRON HCL 4 MG/2ML IJ SOLN
4.0000 mg | Freq: Four times a day (QID) | INTRAMUSCULAR | Status: DC | PRN
  Administered 2019-04-07: 4 mg via INTRAVENOUS
  Filled 2019-04-07: qty 2

## 2019-04-07 MED ORDER — ONDANSETRON HCL 4 MG PO TABS: 4.0000 mg | ORAL_TABLET | Freq: Four times a day (QID) | ORAL | Status: DC | PRN

## 2019-04-07 NOTE — Progress Notes (Signed)
Pharmacy Antibiotic Note  [name] is a 62 y.o. female admitted on 04/07/2019 with bacteremia.  Pharmacy has been consulted for Vancomycin dosing.  Plan: Vancomycin 1500mg  loading dose, then 1000mg  IV every 12 hours.  Goal trough 15-20 mcg/mL.  F/U cxs and clinical progress Monitor V/S, labs, and levels as indicated  Height: 5\' 2"  (157.5 cm) Weight: 187 lb (84.8 kg) IBW/kg (Calculated) : 50.1  Temp (24hrs), Avg:98.2 F (36.8 C), Min:98.2 F (36.8 C), Max:98.2 F (36.8 C)  Recent Labs  Lab 04/06/19 1218 04/06/19 1220 04/06/19 1318 04/07/19 1059 04/07/19 1237  WBC 9.3  --   --  7.4  --   CREATININE 1.24* 1.20*  --  0.72  --   LATICACIDVEN  --  3.4* 2.5* 1.1 1.2    Estimated Creatinine Clearance: 74.6 mL/min (by C-G formula based on SCr of 0.72 mg/dL).    Allergies  Allergen Reactions  . Amoxicillin Hives, Rash and Other (See Comments)    REACTION: Stomach bleeding    . Ampicillin Rash and Other (See Comments)    REACTION: Stomach bleeding  . Aspirin Other (See Comments)    REACTION: ulcer and GI bleeding. Patient states she can take the 81mg  aspirin-NO 325MG   . Bee Venom Anaphylaxis  . Chlorzoxazone Rash and Other (See Comments)    Caused pleural effusion/excessive fluid buildup  . Ciprofloxacin Anaphylaxis and Other (See Comments)    REACTION: Throat swells shut  . Heparin Other (See Comments)    MAKES HEART STOP BEATING.   . Indomethacin Other (See Comments)    MAKES HEART STOP BEATING.   . Nsaids Other (See Comments)    REACTION: Stomach bleeding  . Penicillins Hives, Shortness Of Breath, Swelling and Other (See Comments)    Has patient had a PCN reaction causing immediate rash, facial/tongue/throat swelling, SOB or lightheadedness with hypotension: Yes Has patient had a PCN reaction causing severe rash involving mucus membranes or skin necrosis: Yes Has patient had a PCN reaction that required hospitalization: Unknown Has patient had a PCN reaction  occurring within the last 10 years: No If all of the above answers are "NO", then may proceed with Cephalosporin use.   . Sulfa Antibiotics Other (See Comments)    Per patient, can not take sulfa antibiotics, patient unsure her reaction but knows she "cant have it"  . Albumin (Human) Rash and Other (See Comments)    Unknown other reactions  . Cephalexin Hives, Itching and Rash  . Chlorpromazine Rash and Other (See Comments)    hyperactivity  . Darvocet [Propoxyphene N-Acetaminophen] Itching, Nausea And Vomiting and Rash  . Dilaudid [Hydromorphone Hcl] Itching, Nausea And Vomiting and Swelling  . Fentanyl Hives and Nausea And Vomiting  . Hydrocodone Nausea And Vomiting  . Ketorolac Tromethamine Itching, Nausea And Vomiting and Rash  . Latex Hives  . Nortriptyline Hcl Itching, Rash and Other (See Comments)    REACTION: Hyperactive  . Oxycodone Nausea And Vomiting  . Pentazocine Nausea And Vomiting and Rash  . Pentazocine-Naloxone Nausea And Vomiting  . Percocet [Oxycodone-Acetaminophen] Itching, Nausea And Vomiting and Rash  . Propoxyphene Itching, Nausea And Vomiting and Rash    Darvon  . Sertraline Hcl Other (See Comments)    REACTION: Hyperactive - bounce off the wall  . Tramadol Hcl Nausea And Vomiting  . Codeine Rash  . Hyoscyamine Sulfate Other (See Comments)  . Imipramine Itching and Rash  . Tramadol Rash    Antimicrobials this admission: Vancomycin 11/30 >>  Dose adjustments this admission: Vancomycin prn  Microbiology results: 11/29 BCx: GPC in 1 of 2 bottles 11/30 BCx: pending  Thank you for allowing pharmacy to be a part of this patient's care.  Isac Sarna, BS Pharm D, [place] Clinical Pharmacist Pager [phone] 04/07/2019 2:51 PM
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
## 2019-04-07 NOTE — Telephone Encounter (Signed)
Lab called with pos blood culture  Gram pos cocci, discussed w Dr. Langston Masker  Who recommended she return to [facility] for additional follow up,  Called pt w recommendation who agreeded
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
## 2019-04-08 DIAGNOSIS — K219 Gastro-esophageal reflux disease without esophagitis: Secondary | ICD-10-CM | POA: Diagnosis not present

## 2019-04-08 DIAGNOSIS — G894 Chronic pain syndrome: Secondary | ICD-10-CM | POA: Diagnosis not present

## 2019-04-08 DIAGNOSIS — R7881 Bacteremia: Secondary | ICD-10-CM | POA: Diagnosis not present

## 2019-04-08 DIAGNOSIS — G40909 Epilepsy, unspecified, not intractable, without status epilepticus: Secondary | ICD-10-CM | POA: Diagnosis not present

## 2019-04-08 LAB — BASIC METABOLIC PANEL
Anion gap: 5 (ref 5–15)
BUN: 12 mg/dL (ref 8–23)
CO2: 26 mmol/L (ref 22–32)
Calcium: 8.1 mg/dL — ABNORMAL LOW (ref 8.9–10.3)
Chloride: 111 mmol/L (ref 98–111)
Creatinine, Ser: 0.81 mg/dL (ref 0.44–1.00)
GFR calc Af Amer: >60 mL/min (ref >60)
GFR calc non Af Amer: >60 mL/min (ref >60)
Glucose, Bld: 110 mg/dL — ABNORMAL HIGH (ref 70–99)
Potassium: 3.9 mmol/L (ref 3.5–5.1)
Sodium: 142 mmol/L (ref 135–145)

## 2019-04-08 LAB — SARS CORONAVIRUS 2 (TAT 6-24 HRS): SARS Coronavirus 2: NEGATIVE

## 2019-04-08 MED ORDER — CYCLOBENZAPRINE HCL 10 MG PO TABS: 10.0000 mg | ORAL_TABLET | Freq: Three times a day (TID) | ORAL | Status: AC | PRN

## 2019-04-08 NOTE — Discharge Summary (Signed)
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 72 67  BILITOT 0.5 0.4  PROT 6.3* 6.3*  ALBUMIN 3.6 3.6   Recent Labs  Lab 04/06/19 1218  LIPASE 15   No results for input(s): AMMONIA in the last 168  hours. CBC: Recent Labs  Lab 04/06/19 1218 04/06/19 1220 04/07/19 1059  WBC 9.3  --  7.4  NEUTROABS 6.4  --  4.3  HGB 13.3 13.9 12.9  HCT 42.1 41.0 41.3  MCV 93.3  --  93.0  PLT 300  --  298   Cardiac Enzymes: No results for input(s): CKTOTAL, CKMB, CKMBINDEX, TROPONINI in the last 168 hours. BNP: Invalid input(s): POCBNP CBG: No results for input(s): GLUCAP in the last 168 hours. D-Dimer No results for input(s): DDIMER in the last 72 hours. Hgb A1c No results for input(s): HGBA1C in the last 72 hours. Lipid Profile No results for input(s): CHOL, HDL, LDLCALC, TRIG, CHOLHDL, LDLDIRECT in the last 72  hours. Thyroid function studies No results for input(s): TSH, T4TOTAL, T3FREE, THYROIDAB in the last 72 hours.  Invalid input(s): FREET3 Anemia work up No results for input(s): VITAMINB12, FOLATE, FERRITIN, TIBC, IRON, RETICCTPCT in the last 72 hours. Urinalysis    Component Value Date/Time   COLORURINE YELLOW 04/06/2019 1745   APPEARANCEUR CLOUDY (A) 04/06/2019 1745   LABSPEC >1.046 (H) 04/06/2019 1745   PHURINE 5.0 04/06/2019 1745   GLUCOSEU NEGATIVE 04/06/2019 1745   HGBUR NEGATIVE 04/06/2019 1745   BILIRUBINUR NEGATIVE 04/06/2019 1745   KETONESUR NEGATIVE 04/06/2019 1745   PROTEINUR 30 (A) 04/06/2019 1745   UROBILINOGEN 1.0 08/29/2011 0827   NITRITE NEGATIVE 04/06/2019 1745   LEUKOCYTESUR NEGATIVE 04/06/2019 1745   Sepsis Labs Invalid input(s): PROCALCITONIN,  WBC,  LACTICIDVEN Microbiology Recent Results (from the past 240 hour(s))  Culture, blood (routine x 2)     Status: None (Preliminary result)   Collection Time: 04/06/19 12:20 PM   Specimen: BLOOD RIGHT ARM  Result Value Ref Range Status   Specimen Description   Final    BLOOD RIGHT ARM BOTTLES DRAWN AEROBIC AND ANAEROBIC Performed at [facility], [address], [place], [place] [address]    Special Requests   Final    Blood Culture adequate volume Performed at [facility], [address] [place], [place] [address]    Culture  Setup Time   Final    GRAM POSITIVE COCCI AEROBIC BOTTLE ONLY Gram Stain Report Called to,Read Back By and Verified With: COBLE S. AT 0828A ON [id number] BY THOMPSON S. Performed at [facility], [address] [place], [place] [address]    Culture [facility] POSITIVE COCCI  Final   Report Status PENDING  Incomplete  Culture, blood (routine x 2)     Status: None (Preliminary result)   Collection Time: 04/06/19  1:44 PM   Specimen: Right Antecubital; Blood  Result Value Ref Range Status   Specimen Description   Final    RIGHT ANTECUBITAL BOTTLES DRAWN AEROBIC AND ANAEROBIC   Special  Requests   Final    Blood Culture results may not be optimal due to an inadequate volume of blood received in culture bottles   Culture   Final    NO GROWTH 2 DAYS Performed at [facility], [address] [place], [place] [address]    Report Status PENDING  Incomplete  Blood culture (routine x 2)     Status: None (Preliminary result)   Collection Time: 04/07/19 12:26 PM   Specimen: BLOOD LEFT ARM  Result Value Ref Range Status   Specimen Description BLOOD LEFT ARM  Final   Special Requests   Final    BOTTLES DRAWN AEROBIC AND ANAEROBIC Blood Culture results may not be optimal due to an excessive volume of blood received in  culture bottles   Culture   Final    NO GROWTH < 24 HOURS Performed at [facility], [address] [place], [place] [address]    Report Status PENDING  Incomplete  Blood culture (routine x 2)     Status: None (Preliminary result)   Collection Time: 04/07/19 12:33 PM   Specimen: BLOOD LEFT HAND  Result Value Ref Range Status   Specimen Description BLOOD LEFT HAND  Final   Special Requests   Final    BOTTLES DRAWN AEROBIC ONLY Blood Culture results may not be optimal due to an inadequate volume of blood received in culture bottles   Culture   Final    NO GROWTH < 24 HOURS Performed at [facility], [address], [place], [place] [address]    Report Status PENDING  Incomplete  SARS CORONAVIRUS 2 (TAT 6-24 HRS) Nasopharyngeal Nasopharyngeal Swab     Status: None   Collection Time: 04/07/19  2:47 PM   Specimen: Nasopharyngeal Swab  Result Value Ref Range Status   SARS Coronavirus 2 NEGATIVE NEGATIVE Final    Comment: (NOTE) SARS-CoV-2 target nucleic acids are NOT DETECTED. The SARS-CoV-2 RNA is generally detectable in upper and lower respiratory specimens during the acute phase of infection. Negative results do not preclude SARS-CoV-2 infection, do not rule out co-infections with other pathogens, and should not be used as the sole basis for treatment or  other patient management decisions. Negative results must be combined with clinical observations, patient history, and epidemiological information. The expected result is Negative. Fact Sheet for Patients: HairSlick.no Fact Sheet for Healthcare Providers: quierodirigir.com This test is not yet approved or cleared by the [place] FDA and  has been authorized for detection and/or diagnosis of SARS-CoV-2 by FDA under an Emergency Use Authorization (EUA). This EUA will remain  in effect (meaning this test can be used) for the duration of the COVID-19 declaration under Section 56 4(b)(1) of the Act, 21 U.S.C. section 360bbb-3(b)(1), unless the authorization is terminated or revoked sooner. Performed at [facility], [address] [place], [place] [address]    Time coordinating discharge:   SIGNED:  Standley Dakins, MD  [facility] 04/08/2019, 10:54 AM How to contact the [facility] Attending or Consulting provider 7A - 7P or covering provider during after hours 7P -7A, for this patient?  1. Check the care team in [facility] and look for a) attending/consulting [facility] provider listed and b) the [facility] team listed 2. Log into [email or web address] and use [place]'s universal password to access. If you do not have the password, please contact the hospital operator. 3. Locate the [facility] provider you are looking for under [facility] and page to a number that you can be directly reached. 4. If you still have difficulty reaching the provider, please page the [facility] (Director on Call) for the Hospitalists listed on amion for assistance.

## 2019-04-08 NOTE — Discharge Instructions (Signed)
Dehydration, Adult  Dehydration is when there is not enough fluid or water in your body. This happens when you lose more fluids than you take in. Dehydration can range from mild to very bad. It should be treated right away to keep it from getting very bad. Symptoms of mild dehydration may include:  Thirst.  Dry lips.  Slightly dry mouth.  Dry, warm skin.  Dizziness. Symptoms of moderate dehydration may include:  Very dry mouth.  Muscle cramps.  Dark pee (urine). Pee may be the color of tea.  Your body making less pee.  Your eyes making fewer tears.  Heartbeat that is uneven or faster than normal (palpitations).  Headache.  Light-headedness, especially when you stand up from sitting.  Fainting (syncope). Symptoms of very bad dehydration may include:  Changes in skin, such as: ? Cold and clammy skin. ? Blotchy (mottled) or pale skin. ? Skin that does not quickly return to normal after being lightly pinched and let go (poor skin turgor).  Changes in body fluids, such as: ? Feeling very thirsty. ? Your eyes making fewer tears. ? Not sweating when body temperature is high, such as in hot weather. ? Your body making very little pee.  Changes in vital signs, such as: ? Weak pulse. ? Pulse that is more than 100 beats a minute when you are sitting still. ? Fast breathing. ? Low blood pressure.  Other changes, such as: ? Sunken eyes. ? Cold hands and feet. ? Confusion. ? Lack of energy (lethargy). ? Trouble waking up from sleep. ? Short-term weight loss. ? Unconsciousness. Follow these instructions at home:   If told by your doctor, drink an ORS: ? Make an ORS by using instructions on the package. ? Start by drinking small amounts, about  cup (120 mL) every 5-10 minutes. ? Slowly drink more until you have had the amount that your doctor said to have.  Drink enough clear fluid to keep your pee clear or pale yellow. If you were told to drink an ORS, finish the  ORS first, then start slowly drinking clear fluids. Drink fluids such as: ? Water. Do not drink only water by itself. Doing that can make the salt (sodium) level in your body get too low (hyponatremia). ? Ice chips. ? Fruit juice that you have added water to (diluted). ? Low-calorie sports drinks.  Avoid: ? Alcohol. ? Drinks that have a lot of sugar. These include high-calorie sports drinks, fruit juice that does not have water added, and soda. ? Caffeine. ? Foods that are greasy or have a lot of fat or sugar.  Take over-the-counter and prescription medicines only as told by your doctor.  Do not take salt tablets. Doing that can make the salt level in your body get too high (hypernatremia).  Eat foods that have minerals (electrolytes). Examples include bananas, oranges, potatoes, tomatoes, and spinach.  Keep all follow-up visits as told by your doctor. This is important. Contact a doctor if:  You have belly (abdominal) pain that: ? Gets worse. ? Stays in one area (localizes).  You have a rash.  You have a stiff neck.  You get angry or annoyed more easily than normal (irritability).  You are more sleepy than normal.  You have a harder time waking up than normal.  You feel: ? Weak. ? Dizzy. ? Very thirsty.  You have peed (urinated) only a small amount of very dark pee during 6-8 hours. Get help right away if:  You have  symptoms of very bad dehydration.  You cannot drink fluids without throwing up (vomiting).  Your symptoms get worse with treatment.  You have a fever.  You have a very bad headache.  You are throwing up or having watery poop (diarrhea) and it: ? Gets worse. ? Does not go away.  You have blood or something green (bile) in your throw-up.  You have blood in your poop (stool). This may cause poop to look black and tarry.  You have not peed in 6-8 hours.  You pass out (faint).  Your heart rate when you are sitting still is more than 100 beats a  minute.  You have trouble breathing. This information is not intended to replace advice given to you by your health care provider. Make sure you discuss any questions you have with your health care provider. Document Released: 02/18/2009 Document Revised: 04/06/2017 Document Reviewed: 06/18/2015 Elsevier Patient Education  2020 [place].   IMPORTANT INFORMATION: PAY CLOSE ATTENTION   PHYSICIAN DISCHARGE INSTRUCTIONS  Follow with Primary care provider  Lemmie Evens, MD  and other consultants as instructed by your Hospitalist Physician  [place] IF SYMPTOMS COME BACK, WORSEN OR NEW PROBLEM DEVELOPS   Please note: You were cared for by a hospitalist during your hospital stay. Every effort will be made to forward records to your primary care provider.  You can request that your primary care provider send for your hospital records if they have not received them.  Once you are discharged, your primary care physician will handle any further medical issues. Please note that NO REFILLS for any discharge medications will be authorized once you are discharged, as it is imperative that you return to your primary care physician (or establish a relationship with a primary care physician if you do not have one) for your post hospital discharge needs so that they can reassess your need for medications and monitor your lab values.  Please get a complete blood count and chemistry panel checked by your Primary MD at your next visit, and again as instructed by your Primary MD.  Get Medicines reviewed and adjusted: Please take all your medications with you for your next visit with your Primary MD  Laboratory/radiological data: Please request your Primary MD to go over all hospital tests and procedure/radiological results at the follow up, please ask your primary care provider to get all Hospital records sent to his/her office.  In some cases, they will be blood  work, cultures and biopsy results pending at the time of your discharge. Please request that your primary care provider follow up on these results.  If you are diabetic, please bring your blood sugar readings with you to your follow up appointment with primary care.    Please call and make your follow up appointments as soon as possible.    Also Note the following: If you experience worsening of your admission symptoms, develop shortness of breath, life threatening emergency, suicidal or homicidal thoughts you must seek medical attention immediately by calling 911 or calling your MD immediately  if symptoms less severe.  You must read complete instructions/literature along with all the possible adverse reactions/side effects for all the Medicines you take and that have been prescribed to you. Take any new Medicines after you have completely understood and accpet all the possible adverse reactions/side effects.   Do not drive when taking Pain medications or sleeping medications (Benzodiazepines)  Do not take more than prescribed Pain,  Sleep and Anxiety Medications. It is not advisable to combine anxiety,sleep and pain medications without talking with your primary care practitioner  Special Instructions: If you have smoked or chewed Tobacco  in the last 2 yrs please stop smoking, stop any regular Alcohol  and or any Recreational drug use.  Wear Seat belts while driving.  Do not drive if taking any narcotic, mind altering or controlled substances or recreational drugs or alcohol.

## 2019-04-08 NOTE — Plan of Care (Signed)

## 2019-04-09 LAB — CULTURE, BLOOD (ROUTINE X 2): Special Requests: ADEQUATE

## 2019-04-10 ENCOUNTER — Telehealth: Payer: Self-pay | Admitting: *Deleted

## 2019-04-10 NOTE — Telephone Encounter (Signed)
Post ED Visit - Positive Culture Follow-up  Culture report reviewed by antimicrobial stewardship pharmacist: [place] Team []  Elenor Quinones, Pharm.D. []  Heide Guile, Pharm.D., BCPS AQ-ID []  Parks Neptune, Pharm.D., BCPS []  Alycia Rossetti, Pharm.D., BCPS []  [place], Pharm.D., BCPS, AAHIVP []  Legrand Como, Pharm.D., BCPS, AAHIVP []  Salome Arnt, PharmD, BCPS []  Johnnette Gourd, PharmD, BCPS []  Hughes Better, PharmD, BCPS []  Leeroy Cha, PharmD []  Laqueta Linden, PharmD, BCPS []  Albertina Parr, PharmD  [place] Team []  Leodis Sias, PharmD []  Lindell Spar, PharmD []  Royetta Asal, PharmD []  Graylin Shiver, Rph []  Rema Fendt) Glennon Mac, PharmD []  Arlyn Dunning, PharmD []  Netta Cedars, PharmD []  Dia Sitter, PharmD []  Leone Haven, PharmD []  Gretta Arab, PharmD []  Theodis Shove, PharmD []  Peggyann Juba, PharmD []  Reuel Boom, PharmD   Positive blood culture Likely contaminant and no further patient follow-up is required at this time.  [facility]on [facility] 04/10/2019, 9:42 AM

## 2019-04-11 LAB — CULTURE, BLOOD (ROUTINE X 2): Culture: NO GROWTH

## 2019-04-12 LAB — CULTURE, BLOOD (ROUTINE X 2)
Culture: NO GROWTH
Culture: NO GROWTH

## 2019-12-10 ENCOUNTER — Emergency Department (HOSPITAL_COMMUNITY)
Admission: EM | Admit: 2019-12-10 | Discharge: 2019-12-10 | Disposition: A | Discharge status: Home / Self Care | Payer: Medicare Other | Attending: Emergency Medicine | Admitting: Emergency Medicine

## 2019-12-10 ENCOUNTER — Encounter (HOSPITAL_COMMUNITY): Payer: Self-pay | Admitting: Emergency Medicine

## 2019-12-10 ENCOUNTER — Emergency Department (HOSPITAL_COMMUNITY): Payer: Medicare Other

## 2019-12-10 ENCOUNTER — Other Ambulatory Visit: Payer: Self-pay

## 2019-12-10 DIAGNOSIS — G43109 Migraine with aura, not intractable, without status migrainosus: Secondary | ICD-10-CM | POA: Insufficient documentation

## 2019-12-10 DIAGNOSIS — R519 Headache, unspecified: Secondary | ICD-10-CM

## 2019-12-10 DIAGNOSIS — E039 Hypothyroidism, unspecified: Secondary | ICD-10-CM | POA: Diagnosis not present

## 2019-12-10 DIAGNOSIS — R110 Nausea: Secondary | ICD-10-CM | POA: Insufficient documentation

## 2019-12-10 DIAGNOSIS — Z79890 Hormone replacement therapy (postmenopausal): Secondary | ICD-10-CM | POA: Diagnosis not present

## 2019-12-10 DIAGNOSIS — R0602 Shortness of breath: Secondary | ICD-10-CM | POA: Insufficient documentation

## 2019-12-10 DIAGNOSIS — R55 Syncope and collapse: Secondary | ICD-10-CM | POA: Diagnosis not present

## 2019-12-10 DIAGNOSIS — J45909 Unspecified asthma, uncomplicated: Secondary | ICD-10-CM | POA: Diagnosis not present

## 2019-12-10 DIAGNOSIS — Z79899 Other long term (current) drug therapy: Secondary | ICD-10-CM | POA: Diagnosis not present

## 2019-12-10 DIAGNOSIS — R200 Anesthesia of skin: Secondary | ICD-10-CM | POA: Insufficient documentation

## 2019-12-10 LAB — DIFFERENTIAL
Abs Immature Granulocytes: 0.03 10*3/uL (ref 0.00–0.07)
Basophils Absolute: 0 10*3/uL (ref 0.0–0.1)
Basophils Relative: 0 %
Eosinophils Absolute: 0.2 10*3/uL (ref 0.0–0.5)
Eosinophils Relative: 2 %
Immature Granulocytes: 0 %
Lymphocytes Relative: 24 %
Lymphs Abs: 2.6 10*3/uL (ref 0.7–4.0)
Monocytes Absolute: 0.9 10*3/uL (ref 0.1–1.0)
Monocytes Relative: 9 %
Neutro Abs: 7.2 10*3/uL (ref 1.7–7.7)
Neutrophils Relative %: 65 %

## 2019-12-10 LAB — COMPREHENSIVE METABOLIC PANEL
ALT: 27 U/L (ref 0–44)
AST: 30 U/L (ref 15–41)
Albumin: 3.6 g/dL (ref 3.5–5.0)
Alkaline Phosphatase: 84 U/L (ref 38–126)
Anion gap: 7 (ref 5–15)
BUN: 11 mg/dL (ref 8–23)
CO2: 29 mmol/L (ref 22–32)
Calcium: 9.2 mg/dL (ref 8.9–10.3)
Chloride: 103 mmol/L (ref 98–111)
Creatinine, Ser: 0.92 mg/dL (ref 0.44–1.00)
GFR calc Af Amer: >60 mL/min (ref >60)
GFR calc non Af Amer: >60 mL/min (ref >60)
Glucose, Bld: 101 mg/dL — ABNORMAL HIGH (ref 70–99)
Potassium: 4 mmol/L (ref 3.5–5.1)
Sodium: 139 mmol/L (ref 135–145)
Total Bilirubin: 0.9 mg/dL (ref 0.3–1.2)
Total Protein: 6.7 g/dL (ref 6.5–8.1)

## 2019-12-10 LAB — CBC
HCT: 42.7 % (ref 36.0–46.0)
Hemoglobin: 13.4 g/dL (ref 12.0–15.0)
MCH: 29.1 pg (ref 26.0–34.0)
MCHC: 31.4 g/dL (ref 30.0–36.0)
MCV: 92.8 fL (ref 80.0–100.0)
Platelets: 288 10*3/uL (ref 150–400)
RBC: 4.6 MIL/uL (ref 3.87–5.11)
RDW: 13.2 % (ref 11.5–15.5)
WBC: 11 10*3/uL — ABNORMAL HIGH (ref 4.0–10.5)
nRBC: 0 % (ref 0.0–0.2)

## 2019-12-10 LAB — URINALYSIS, ROUTINE W REFLEX MICROSCOPIC
Bilirubin Urine: NEGATIVE
Glucose, UA: NEGATIVE mg/dL
Hgb urine dipstick: NEGATIVE
Ketones, ur: NEGATIVE mg/dL
Leukocytes,Ua: NEGATIVE
Nitrite: NEGATIVE
Protein, ur: NEGATIVE mg/dL
Specific Gravity, Urine: >1.046 — ABNORMAL HIGH (ref 1.005–1.030)
pH: 6 (ref 5.0–8.0)

## 2019-12-10 LAB — TROPONIN I (HIGH SENSITIVITY): Troponin I (High Sensitivity): 6 ng/L (ref <18)

## 2019-12-10 LAB — I-STAT CHEM 8, ED
BUN: 14 mg/dL (ref 8–23)
Calcium, Ion: 1.14 mmol/L — ABNORMAL LOW (ref 1.15–1.40)
Chloride: 100 mmol/L (ref 98–111)
Creatinine, Ser: 0.8 mg/dL (ref 0.44–1.00)
Glucose, Bld: 97 mg/dL (ref 70–99)
HCT: 43 % (ref 36.0–46.0)
Hemoglobin: 14.6 g/dL (ref 12.0–15.0)
Potassium: 3.9 mmol/L (ref 3.5–5.1)
Sodium: 142 mmol/L (ref 135–145)
TCO2: 28 mmol/L (ref 22–32)

## 2019-12-10 LAB — CBG MONITORING, ED: Glucose-Capillary: 95 mg/dL (ref 70–99)

## 2019-12-10 LAB — D-DIMER, QUANTITATIVE: D-Dimer, Quant: 0.4 ug/mL-FEU (ref 0.00–0.50)

## 2019-12-10 LAB — PROTIME-INR
INR: 1 (ref 0.8–1.2)
Prothrombin Time: 12.6 seconds (ref 11.4–15.2)

## 2019-12-10 LAB — APTT: aPTT: 25 seconds (ref 24–36)

## 2019-12-10 MED ORDER — DIPHENHYDRAMINE HCL 50 MG/ML IJ SOLN
25.0000 mg | Freq: Once | INTRAMUSCULAR | Status: AC
  Administered 2019-12-10: 25 mg via INTRAVENOUS
  Filled 2019-12-10: qty 1

## 2019-12-10 MED ORDER — SODIUM CHLORIDE 0.9% FLUSH: 3.0000 mL | Freq: Once | INTRAVENOUS | Status: DC

## 2019-12-10 MED ORDER — IOHEXOL 350 MG/ML SOLN
100.0000 mL | Freq: Once | INTRAVENOUS | Status: AC | PRN
  Administered 2019-12-10: 100 mL via INTRAVENOUS

## 2019-12-10 MED ORDER — METOCLOPRAMIDE HCL 5 MG/ML IJ SOLN
10.0000 mg | Freq: Once | INTRAMUSCULAR | Status: AC
  Administered 2019-12-10: 10 mg via INTRAVENOUS
  Filled 2019-12-10: qty 2

## 2019-12-10 NOTE — Discharge Instructions (Addendum)
You were evaluated in the Emergency Department and after careful evaluation, we did not find any emergent condition requiring admission or further testing in the hospital.  Your exam/testing today was overall reassuring.  MRI without any evidence of stroke.  Please return to the Emergency Department if you experience any worsening of your condition.  Thank you for allowing [place] to be a part of your care.

## 2019-12-10 NOTE — ED Notes (Signed)
Pt transported to MRI 

## 2019-12-10 NOTE — ED Provider Notes (Signed)
°  Provider Note MRN:  [id number]  Arrival date & time: 12/10/19    ED Course and Medical Decision Making  Assumed care from Dr. Dalene Seltzer at shift change.  Suspect complex migraine, being evaluated for syncope as well.  MRI is without evidence of stroke, the remainder of the work-up is reassuring with negative D-dimer, negative troponin.  On my reassessment patient is feeling better and is requesting discharge, will follow up with neurology.  Procedures  Final Clinical Impressions(s) / ED Diagnoses     ICD-10-CM   1. Syncope, unspecified syncope type  R55   2. Numbness on left side  R20.0   3. Complicated migraine  G43.109   4. Acute nonintractable headache, unspecified headache type  R51.9     ED Discharge Orders    None        Discharge Instructions     You were evaluated in the Emergency Department and after careful evaluation, we did not find any emergent condition requiring admission or further testing in the hospital.  Your exam/testing today was overall reassuring.  MRI without any evidence of stroke.  Please return to the Emergency Department if you experience any worsening of your condition.  Thank you for allowing [place] to be a part of your care.     Elmer Sow. Pilar Plate, MD [facility] Emergency Medicine [facility] [email or web address]    Sabas Sous, MD 12/10/19 [phone]

## 2019-12-10 NOTE — Code Documentation (Signed)
Stroke Response Nurse Documentation Code Documentation  [name] is a 64 y.o. female arriving to [facility] ED via POV on 8/4 with past medical hx of DVT, Cervical Spine Surgery, and Headache. Code stroke was activated by EDP Sch[place]. Patient from home where she was LKW at 2330 Last night and now complaining of blurred vision and left sensory decreased to the left side. Stroke team at the bedside in CT after Code Stroke paged. Labs drawn and patient cleared for CT by Dr. Dalene Seltzer. Patient to CT with team. NIHSS 1, see documentation for details and code stroke times. Patient with left decreased sensation and blurred vision on exam. The following imaging was completed: CT/CTA. Patient is not a candidate for tPA due to being outside the window. Care/Plan: Pt to have MRI. Q2 mNIHSS and VS. Bedside handoff with ED RN Ileene Hutchinson  Stroke Response RN

## 2019-12-10 NOTE — ED Provider Notes (Signed)
[facility] EMERGENCY DEPARTMENT Provider Note   CSN: [id number] Arrival date & time: 12/10/19  1052  An emergency department physician performed an initial assessment on this suspected stroke patient at 1150.  History Chief Complaint  Patient presents with  . Loss of Consciousness    [name] is a 64 y.o. female.  HPI      64 year old female with a history of remote DVT, prior reported CVA per patient, possible seizure disorder, fibromyalgia, presents with concern for syncope, blurred vision and numbness.  Reports that she went to bed at 11:30 PM in normal state of health, and woke up this morning with nausea, headache, and numbness to the left side.  Around 6 or 7 AM, she continued to have the symptoms, nausea worsened, and she sat up out of bed and walked to the kitchen and had a syncopal episode.  She denies preceding chest pain, shortness of breath.  Reports that she had an episode of urinary incontinence.  Her husband did not see any seizure-like activity.  She says he witnessed it and believes she was only out for a few seconds.    Reports that [place] [employer] [place] was at one time told she might have a history of seizures and was given a prescription for phenobarbital, however has not since been seeing neurology, not had official seizure disorder diagnosis.  Reports she has been in normal state of health with no cough, fever, vomiting, diarrhea, black or bloody stools.  Denies urinary symptoms.  Reports that after episode of syncope, she awoke with blurred vision bilaterally.  Denies double vision, or missing pieces of vision.  Reports that things like hazy out of both eyes.  In addition, felt weakness and numbness to the left side.  In addition to the symptoms she also reported shortness of breath.  No significant palpitations.  Reports some associated lightheadedness.   Past Medical History:  Diagnosis Date  . Allergic rhinitis   . Anxiety   .  Arthritis   . Asthma   . Atypical chest pain    cath 2007 showing normal coronary arteries, last echo 2003 with normal EF and no systolic dysfunction  . Broken ribs    Trauma falling off of horse November 2012  . Chest pain   . Chronic pain syndrome   . CVA (cerebral infarction)    reported by patient, no documentation  . Depression    history of prior suicide attempts  . DVT (deep venous thrombosis) (HCC)    remote at age 73  . GERD (gastroesophageal reflux disease)   . Headache   . Herniated nucleus pulposus, L4-5 left 2001   s/p Left microsurgical exploration L4-5 and microdiskectomy with lysis  . Hypotension    history of hypotension in the past requiring fluid boluses  . Hypothyroidism   . Mitral valve prolapse   . Mitral valve prolapse   . Pneumonia 08/29/2011  . Pseudoarthrosis of lumbar spine   . Seizures (HCC)    in the 1990's; takes neurontin for this;no seizures since then.  . Shortness of breath   . Stroke (HCC)    hx of TIA  . TIA (transient ischemic attack)     Patient Active Problem List   Diagnosis Date Noted  . Positive blood culture 04/07/2019  . Abnormal weight loss 01/14/2019  . Nausea with vomiting 01/14/2019  . RUQ pain 01/14/2019  . Pseudoarthrosis of lumbar spine 11/26/2017  . Acute bronchitis 06/09/2017  . Spondylolisthesis  at L4-L5 level 06/05/2016  . Chest pain 08/29/2011  . Dyspnea 03/15/2011  . OPEN WOUND FT NO TOE ALONE WITHOUT MENTION COMP 05/22/2007  . VIRAL INFECTION 05/07/2007  . NASOPHARYNGITIS, ACUTE 05/07/2007  . OBESITY 03/25/2007  . Anxiety state 03/25/2007  . DEPRESSION 03/25/2007  . Chronic pain syndrome 03/25/2007  . MIGRAINE HEADACHE 03/25/2007  . CARPAL TUNNEL SYNDROME 03/25/2007  . NEUROPATHY 03/25/2007  . ABNORMAL HEART RHYTHMS 03/25/2007  . ALLERGIC RHINITIS 03/25/2007  . BRONCHITIS, CHRONIC 03/25/2007  . ASTHMA 03/25/2007  . GERD 03/25/2007  . PUD 03/25/2007  . IBS 03/25/2007  . ARTHRITIS 03/25/2007  . LOW  BACK PAIN, CHRONIC 03/25/2007  . FIBROMYALGIA 03/25/2007  . Seizure disorder (HCC) 03/25/2007  . MEMORY LOSS 03/25/2007  . ABDOMINAL PAIN 03/25/2007    Past Surgical History:  Procedure Laterality Date  . ABDOMINAL HYSTERECTOMY    . APPENDECTOMY    . BACK SURGERY  05/2016  . BACK SURGERY  11/2017  . BREAST BIOPSY Right    neg  . CERVICAL SPINE SURGERY    . CHOLECYSTECTOMY    . cholescystectomy  2003    Laparoscopic cholecystectomy with intraoperative  . ELBOW SURGERY Right    3 different surgeries   . INCISIONAL HERNIA REPAIR N/A 01/21/2016   Procedure: HERNIA REPAIR INCISIONAL WITH MESH;  Surgeon: Franky Macho, MD;  Location: [facility];  Service: General;  Laterality: N/A;  . KNEE SURGERY Bilateral   . lower back surgery    . NERVE, TENDON AND ARTERY REPAIR Left 05/09/2013   Procedure: LEFT WRIST EXPLORATION ;  Surgeon: Tami Ribas, MD;  Location: [facility];  Service: Orthopedics;  Laterality: Left;  . OOPHORECTOMY    . right arm surgery     carpal tunnel; and tendon repair of elbow.  Marland Kitchen SHOULDER SURGERY Right   . TONSILLECTOMY       OB History   No obstetric history on file.     Family History  Problem Relation Age of Onset  . Heart attack Brother        CABG  . Hypertrophic cardiomyopathy Son   . Cancer Mother        Uterine  . Cirrhosis Mother   . Alcohol abuse Mother   . Cancer Father        brain  . Esophageal cancer Father   . Alcohol abuse Father   . Cancer Maternal Aunt        cancer  . Cancer Maternal Aunt        breast  . Cancer Maternal Aunt        breast  . Cancer Cousin        breast  . Cancer Cousin        breast  . Cancer Cousin        breast  . Other Other        died due to perforated colon after colonoscopy    Social History   Tobacco Use  . Smoking status: Never Smoker  . Smokeless tobacco: Never Used  Vaping Use  . Vaping Use: Never used  Substance Use Topics  . Alcohol use: No    Alcohol/week: 0.0 standard  drinks  . Drug use: No    Home Medications Prior to Admission medications   Medication Sig Start Date End Date Taking? Authorizing Provider  albuterol (PROVENTIL HFA;VENTOLIN HFA) 108 (90 BASE) MCG/ACT inhaler Inhale 2 puffs into the lungs every 6 (six) hours as needed for wheezing.  08/30/11  Yes Wainright, Carlis Stable, MD  albuterol (PROVENTIL) (2.5 MG/3ML) 0.083% nebulizer solution Take 2.5 mg by nebulization 4 (four) times daily as needed for wheezing or shortness of breath.  08/19/19  Yes [provider]  cyclobenzaprine (FLEXERIL) 10 MG tablet Take 1 tablet (10 mg total) by mouth 3 (three) times daily as needed for muscle spasms. 04/08/19  Yes Johnson, Clanford L, MD  FLUoxetine (PROZAC) 20 MG capsule Take 60 mg by mouth every morning. 03/24/19  Yes [provider]  fluticasone (FLONASE) 50 MCG/ACT nasal spray Place 1-2 sprays into both nostrils daily as needed for allergies or rhinitis.    Yes [provider]  gabapentin (NEURONTIN) 300 MG capsule Take 900-1,800 mg by mouth See admin instructions. Take 900 mg by mouth in the morning, 900 mg at 2 PM, and 1,800 mg at bedtime   Yes [provider]  hydrOXYzine (VISTARIL) 25 MG capsule Take 50 mg by mouth at bedtime.  04/20/13  Yes [provider]  LORazepam (ATIVAN) 1 MG tablet Take 1 mg by mouth daily as needed for anxiety.    Yes [provider]  meloxicam (MOBIC) 15 MG tablet Take 15 mg by mouth daily.    Yes [provider]  methylphenidate (RITALIN) 10 MG tablet Take 10 mg by mouth 2 (two) times daily as needed (ADD or when studying).    Yes [provider]  metoprolol succinate (TOPROL-XL) 50 MG 24 hr tablet Take 50 mg by mouth daily. Take with or immediately following a meal.   Yes [provider]  morphine (MSIR) 15 MG tablet Take 15 mg by mouth at bedtime. Take 7.5 mg by mouth one to three times a day as needed for pain   Yes [provider]   omeprazole (PRILOSEC) 20 MG capsule Take 1 capsule (20 mg total) by mouth 2 (two) times daily before a meal. Patient taking differently: Take 20 mg by mouth in the morning and at bedtime.  01/14/19  Yes Tiffany Kocher, PA-C  rOPINIRole (REQUIP) 3 MG tablet Take 3 mg by mouth at bedtime. 09/08/19  Yes [provider]  levothyroxine (SYNTHROID, LEVOTHROID) 75 MCG tablet Take 75 mcg by mouth daily before breakfast. Patient not taking: Reported on 12/10/2019    [provider]    Allergies    Amoxicillin, Ampicillin, Aspirin, Bee venom, Chlorzoxazone, Ciprofloxacin, Heparin, Indomethacin, Nsaids, Penicillins, Sulfa antibiotics, Albumin (human), Cephalexin, Chlorpromazine, Darvocet [propoxyphene n-acetaminophen], Dilaudid [hydromorphone hcl], Fentanyl, Hydrocodone, Ketorolac tromethamine, Latex, Nortriptyline hcl, Oxycodone, Pentazocine, Pentazocine-naloxone, Percocet [oxycodone-acetaminophen], Propoxyphene, Sertraline hcl, Tramadol hcl, Codeine, Hyoscyamine sulfate, Imipramine, Tape, and Tramadol  Review of Systems   Review of Systems  Constitutional: Negative for fever.  HENT: Negative for sore throat.   Eyes: Positive for visual disturbance.  Respiratory: Positive for shortness of breath. Negative for cough.   Cardiovascular: Negative for chest pain.  Gastrointestinal: Positive for nausea. Negative for abdominal pain, blood in stool, diarrhea and vomiting.  Genitourinary: Negative for difficulty urinating and dysuria.  Musculoskeletal: Negative for back pain and neck pain.  Skin: Negative for rash.  Neurological: Positive for syncope, weakness (noted it during exam but did not notice initially) and numbness. Negative for facial asymmetry, speech difficulty and headaches.    Physical Exam Updated Vital Signs BP 102/64   Pulse 87   Temp 98.2 F (36.8 C) (Oral)   Resp 19   Ht  (1.575 m)   Wt 92.4 kg   SpO2 97%   BMI  37.26 kg/m   Physical Exam Vitals and nursing  note reviewed.  Constitutional:      General: She is not in acute distress.    Appearance: She is well-developed. She is not diaphoretic.  HENT:     Head: Normocephalic and atraumatic.  Eyes:     General: No visual field deficit.    Conjunctiva/sclera: Conjunctivae normal.  Cardiovascular:     Rate and Rhythm: Normal rate and regular rhythm.     Heart sounds: Normal heart sounds. No murmur heard.  No friction rub. No gallop.   Pulmonary:     Effort: Pulmonary effort is normal. No respiratory distress.     Breath sounds: Normal breath sounds. No wheezing or rales.  Abdominal:     General: There is no distension.     Palpations: Abdomen is soft.     Tenderness: There is no abdominal tenderness. There is no guarding.  Musculoskeletal:        General: No tenderness.     Cervical back: Normal range of motion.  Skin:    General: Skin is warm and dry.     Findings: No erythema or rash.  Neurological:     Mental Status: She is alert and oriented to person, place, and time.     GCS: GCS eye subscore is 4. GCS verbal subscore is 5. GCS motor subscore is 6.     Cranial Nerves: Cranial nerves are intact. No dysarthria or facial asymmetry.     Sensory: Sensory deficit (left face and arm) present.     Motor: Pronator drift (left arm) present.     ED Results / Procedures / Treatments   Labs (all labs ordered are listed, but only abnormal results are displayed) Labs Reviewed  URINALYSIS, ROUTINE W REFLEX MICROSCOPIC - Abnormal; Notable for the following components:      Result Value   Specific Gravity, Urine >1.046 (*)    All other components within normal limits  COMPREHENSIVE METABOLIC PANEL - Abnormal; Notable for the following components:   Glucose, Bld 101 (*)    All other components within normal limits  CBC - Abnormal; Notable for the following components:   WBC 11.0 (*)    All other components within normal limits  I-STAT CHEM 8, ED - Abnormal; Notable for the following  components:   Calcium, Ion 1.14 (*)    All other components within normal limits  PROTIME-INR  APTT  DIFFERENTIAL  D-DIMER, QUANTITATIVE (NOT AT [facility])  CBG MONITORING, ED  CBG MONITORING, ED  TROPONIN I (HIGH SENSITIVITY)  TROPONIN I (HIGH SENSITIVITY)    EKG EKG Interpretation  Date/Time:  Wednesday December 10 2019 11:13:05 EDT Ventricular Rate:  104 PR Interval:  152 QRS Duration: 78 QT Interval:  348 QTC Calculation: 457 R Axis:   67 Text Interpretation: Sinus tachycardia Otherwise normal ECG Since prior ECG, rate has increased Confirmed by Alvira Monday ([id number]) on 12/10/2019 12:14:09 PM   Radiology MR BRAIN WO CONTRAST  Result Date: 12/10/2019 CLINICAL DATA:  Syncopal episode today.  Nausea. EXAM: MRI HEAD WITHOUT CONTRAST TECHNIQUE: Multiplanar, multiecho pulse sequences of the brain and surrounding structures were obtained without intravenous contrast. COMPARISON:  CT studies same day.  MRI 04/12/2015. FINDINGS: Brain: Diffusion imaging does not show any acute or subacute infarction. The brainstem and cerebellum are normal. Several scattered foci of T2 and FLAIR signal within the hemispheric white matter are unchanged since 2016 and consistent with mild chronic small vessel disease. No cortical or large vessel  territory infarction. No mass lesion, hemorrhage, hydrocephalus or extra-axial collection. Vascular: Major vessels at the base of the brain show flow. Skull and upper cervical spine: Negative Sinuses/Orbits: Clear/normal Other: None IMPRESSION: No acute or subacute finding. Mild chronic small-vessel change of the cerebral hemispheric white matter, similar to the study of 2016. Electronically Signed   By: Paulina Fusi M.D.   On: 12/10/2019 13:33   CT HEAD CODE STROKE WO CONTRAST  Result Date: 12/10/2019 CLINICAL DATA:  Code stroke.  Syncope, nausea. EXAM: CT HEAD WITHOUT CONTRAST TECHNIQUE: Contiguous axial images were obtained from the base of the skull through the vertex  without intravenous contrast. COMPARISON:  Head CT 04/06/2019 FINDINGS: Brain: Stable, mild patchy hypoattenuation within the cerebral white matter which is nonspecific, but consistent with chronic small vessel ischemic disease. There is no acute intracranial hemorrhage. No demarcated cortical infarct is identified. No extra-axial fluid collection. No evidence of intracranial mass. No midline shift. Vascular: No hyperdense vessel. Skull: Normal. Negative for fracture or focal lesion. Sinuses/Orbits: Visualized orbits show no acute finding. No significant paranasal sinus disease. Small right mastoid effusion. ASPECTS (Alberta Stroke Program Early CT Score) - Ganglionic level infarction (caudate, lentiform nuclei, internal capsule, insula, M1-M3 cortex): 7 - Supraganglionic infarction (M4-M6 cortex): 3 Total score (0-10 with 10 being normal): 10 These results were communicated to Dr. Amada Jupiter At 12:14 pmon 8/4/2021by text page via the [facility] [employer] messaging system. IMPRESSION: No CT evidence of acute intracranial abnormality. ASPECTS is 10. Stable, mild cerebral white matter chronic small vessel ischemic disease. Small right mastoid effusion. Electronically Signed   By: Jackey Loge DO   On: 12/10/2019 12:14   CT ANGIO HEAD CODE STROKE  Result Date: 12/10/2019 CLINICAL DATA:  Code stroke follow-up, syncope, left-sided numbness EXAM: CT ANGIOGRAPHY HEAD AND NECK TECHNIQUE: Multidetector CT imaging of the head and neck was performed using the standard protocol during bolus administration of intravenous contrast. Multiplanar CT image reconstructions and MIPs were obtained to evaluate the vascular anatomy. Carotid stenosis measurements (when applicable) are obtained utilizing NASCET criteria, using the distal internal carotid diameter as the denominator. CONTRAST:  OMNIPAQUE IOHEXOL 350 MG/ML SOLN COMPARISON:  2016 CTA neck FINDINGS: CTA NECK FINDINGS Aortic arch: Great vessel origins are patent. Right carotid  system: Patent. Calcified plaque at the ICA origin causes less than 50% stenosis. Left carotid system: Patent. Eccentric plaque at the ICA origin causes less than 50% stenosis. Vertebral arteries: Extracranial vertebral arteries are patent. The left vertebral artery is dominant. Skeleton: Posterior and anterior cervical fusion with associated streak artifact. Other neck: No mass or adenopathy. Upper chest: Included upper lungs are clear Review of the MIP images confirms the above findings CTA HEAD FINDINGS Anterior circulation: Intracranial internal carotid arteries are patent. Anterior and middle cerebral arteries are patent. Posterior circulation: Intracranial vertebral arteries, basilar artery, and posterior cerebral arteries are patent. Venous sinuses: As permitted by contrast timing, patent. Review of the MIP images confirms the above findings IMPRESSION: No large vessel occlusion. Plaque at the ICA origins causes less than 50% stenosis. Electronically Signed   By: Guadlupe Spanish M.D.   On: 12/10/2019 12:34   CT ANGIO NECK CODE STROKE  Result Date: 12/10/2019 CLINICAL DATA:  Code stroke follow-up, syncope, left-sided numbness EXAM: CT ANGIOGRAPHY HEAD AND NECK TECHNIQUE: Multidetector CT imaging of the head and neck was performed using the standard protocol during bolus administration of intravenous contrast. Multiplanar CT image reconstructions and MIPs were obtained to evaluate the vascular anatomy. Carotid stenosis measurements (  when applicable) are obtained utilizing NASCET criteria, using the distal internal carotid diameter as the denominator. CONTRAST:  100mL OMNIPAQUE IOHEXOL 350 MG/ML SOLN COMPARISON:  2016 CTA neck FINDINGS: CTA NECK FINDINGS Aortic arch: Great vessel origins are patent. Right carotid system: Patent. Calcified plaque at the ICA origin causes less than 50% stenosis. Left carotid system: Patent. Eccentric plaque at the ICA origin causes less than 50% stenosis. Vertebral arteries:  Extracranial vertebral arteries are patent. The left vertebral artery is dominant. Skeleton: Posterior and anterior cervical fusion with associated streak artifact. Other neck: No mass or adenopathy. Upper chest: Included upper lungs are clear Review of the MIP images confirms the above findings CTA HEAD FINDINGS Anterior circulation: Intracranial internal carotid arteries are patent. Anterior and middle cerebral arteries are patent. Posterior circulation: Intracranial vertebral arteries, basilar artery, and posterior cerebral arteries are patent. Venous sinuses: As permitted by contrast timing, patent. Review of the MIP images confirms the above findings IMPRESSION: No large vessel occlusion. Plaque at the ICA origins causes less than 50% stenosis. Electronically Signed   By: Guadlupe SpanishPraneil  Patel M.D.   On: 12/10/2019 12:34    Procedures Procedures (including critical care time)  Medications Ordered in ED Medications  sodium chloride flush (NS) 0.9 % injection 3 mL (3 mLs Intravenous Not Given 12/10/19 1221)  sodium chloride flush (NS) 0.9 % injection 3 mL (3 mLs Intravenous Not Given 12/10/19 1221)  iohexol (OMNIPAQUE) 350 MG/ML injection 100 mL (100 mLs Intravenous Contrast Given 12/10/19 1218)  metoCLOPramide (REGLAN) injection 10 mg (10 mg Intravenous Given 12/10/19 1510)  diphenhydrAMINE (BENADRYL) injection 25 mg (25 mg Intravenous Given 12/10/19 1514)    ED Course  I have reviewed the triage vital signs and the nursing notes.  Pertinent labs & imaging results that were available during my care of the patient were reviewed by me and considered in my medical decision making (see chart for details).    MDM Rules/Calculators/A&P                          64 year old female with a history of remote DVT, prior reported CVA per patient, possible seizure disorder, fibromyalgia, presents with concern for syncope, blurred vision and numbness.  Given concern for weakness on exam and visual changes, called code  stroke.  Dr. Amada JupiterKirkpatrick came to bedside for evaluation of patient.  CT angio was done which showed no evidence of acute abnormalities.  MRI shows no evidence of CVA.  In discussion with neurology, we feel neurologic symptoms are most likely secondary to complicated migraine.  Do not recommend further inpatient seizure or TIA work-up.  Regarding her episode of syncope--EKG shows sinus tachycardia, no other signs of acute abnormalities.  Possible vagal episode or orthostatic episode of syncope with anusea and standing.  DDimer negative and low suspicion for PE. Troponin pending at time of transfer of care. UA also pending to evaluate for other etiology of nausea.  If troponin negative, feel she is stable for outpatient follow up with Cardiology, Neurology and PCP.  Given headache cocktail for symptom relief.  UA and troponin pending at time of care.     Final Clinical Impression(s) / ED Diagnoses Final diagnoses:  Syncope, unspecified syncope type  Numbness on left side  Complicated migraine  Acute nonintractable headache, unspecified headache type    Rx / DC Orders ED Discharge Orders    None       Alvira MondaySchlossman, Marylan Glore, MD 12/10/19 1552

## 2019-12-10 NOTE — ED Triage Notes (Signed)
Pt states she had a syncope episode today at 6 am. Pt states she was completely normal last night prior to go to sleep and today she was very nauseated and when she when to the bathroom she loss consciousness and fell on the floor. Pt is AO x 4 on triage no neuro deficit noticed.

## 2019-12-10 NOTE — Consult Note (Addendum)
Neurology Consultation  Reason for Consult: Code stroke Referring Physician: Dalene SeltzerSchlossman  CC: Left-sided decreased sensation  History is obtained from: Patient  HPI: [name] is a 64 y.o. female with history of TIA, stroke, seizures, headache, DVT, CVA.  Patient states that she woke up around 6 AM with nausea and left-sided decreased sensation.  She thought she was going to throw up so she walked to the bathroom.  While walking to the bathroom she had a syncopal event and urinated on herself.  Patient states that she quickly regained consciousness, husband did not note any shaking or seizure activity.  She was driven to the hospital by her husband.  Upon arrival to hospital she continued to have some blurred vision and left-sided decreased sensation.  At that time code stroke was called.  Patient was immediately brought back to CT scanner.  She obtained a CT head along with CTA head and neck.  At this time she does have a slight headache and continues to have left-sided decreased sensation.  Work up that has been done: CT head CTA head neck  LKW: 2230 8/3 tpa given?: no, out of window and too good to treat Premorbid modified Rankin scale (mRS): 0  NIHSS 1a Level of Conscious.: 0 1b LOC Questions: 0 1c LOC Commands: 0 2 Best Gaze: 0 3 Visual: 0 4 Facial Palsy: 0 5a Motor Arm - left: 0 5b Motor Arm - Right: 0 6a Motor Leg - Left: 0 6b Motor Leg - Right: 0 7 Limb Ataxia: 0 8 Sensory:1  9 Best Language: 0 10 Dysarthria: 0 11 Extinct. and Inatten.: 0 TOTAL: 1   Past Medical History:  Diagnosis Date   Allergic rhinitis    Anxiety    Arthritis    Asthma    Atypical chest pain    cath 2007 showing normal coronary arteries, last echo 2003 with normal EF and no systolic dysfunction   Broken ribs    Trauma falling off of horse November 2012   Chest pain    Chronic pain syndrome    CVA (cerebral infarction)    reported by patient, no documentation   Depression     history of prior suicide attempts   DVT (deep venous thrombosis) (HCC)    remote at age 64   GERD (gastroesophageal reflux disease)    Headache    Herniated nucleus pulposus, L4-5 left 2001   s/p Left microsurgical exploration L4-5 and microdiskectomy with lysis   Hypotension    history of hypotension in the past requiring fluid boluses   Hypothyroidism    Mitral valve prolapse    Mitral valve prolapse    Pneumonia 08/29/2011   Pseudoarthrosis of lumbar spine    Seizures (HCC)    in the 1990's; takes neurontin for this;no seizures since then.   Shortness of breath    Stroke (HCC)    hx of TIA   TIA (transient ischemic attack)     Family History  Problem Relation Age of Onset   Heart attack Brother        CABG   Hypertrophic cardiomyopathy Son    Cancer Mother        Uterine   Cirrhosis Mother    Alcohol abuse Mother    Cancer Father        brain   Esophageal cancer Father    Alcohol abuse Father    Cancer Maternal Aunt        cancer   Cancer Maternal Aunt  breast   Cancer Maternal Aunt        breast   Cancer Cousin        breast   Cancer Cousin        breast   Cancer Cousin        breast   Other Other        died due to perforated colon after colonoscopy   Social History:   reports that she has never smoked. She has never used smokeless tobacco. She reports that she does not drink alcohol and does not use drugs.  Medications  Current Facility-Administered Medications:    sodium chloride flush (NS) 0.9 % injection 3 mL, 3 mL, Intravenous, Once, Schlossman, Erin, MD   sodium chloride flush (NS) 0.9 % injection 3 mL, 3 mL, Intravenous, Once, Alvira Monday, MD  Current Outpatient Medications:    albuterol (PROVENTIL HFA;VENTOLIN HFA) 108 (90 BASE) MCG/ACT inhaler, Inhale 2 puffs into the lungs every 6 (six) hours as needed for wheezing., Disp: 1 Inhaler, Rfl: 0   cyclobenzaprine (FLEXERIL) 10 MG tablet, Take 1  tablet (10 mg total) by mouth 3 (three) times daily as needed for muscle spasms., Disp: , Rfl:    FLUoxetine (PROZAC) 20 MG capsule, Take 60 mg by mouth every morning., Disp: , Rfl:    fluticasone (FLONASE) 50 MCG/ACT nasal spray, Place 1 spray into both nostrils daily as needed for allergies or rhinitis., Disp: , Rfl:    gabapentin (NEURONTIN) 300 MG capsule, Take 900-1,800 mg by mouth See admin instructions. Takes 900 mg by mouth in the morning, 900 mg by mouth in the afternoon, and 1800 mg by mouth at bedtime., Disp: , Rfl:    hydrOXYzine (VISTARIL) 25 MG capsule, Take 50 mg by mouth every evening. , Disp: , Rfl:    levothyroxine (SYNTHROID, LEVOTHROID) 75 MCG tablet, Take 75 mcg by mouth daily before breakfast., Disp: , Rfl:    LORazepam (ATIVAN) 1 MG tablet, Take 1 mg by mouth every 8 (eight) hours as needed for anxiety. , Disp: , Rfl:    meloxicam (MOBIC) 15 MG tablet, Take 15 mg by mouth daily. , Disp: , Rfl:    methylphenidate (RITALIN) 10 MG tablet, Take 10 mg by mouth 2 (two) times daily as needed (ADD). , Disp: , Rfl:    metoprolol succinate (TOPROL-XL) 50 MG 24 hr tablet, Take 50 mg by mouth daily. Take with or immediately following a meal., Disp: , Rfl:    omeprazole (PRILOSEC) 20 MG capsule, Take 1 capsule (20 mg total) by mouth 2 (two) times daily before a meal., Disp: 180 capsule, Rfl: 1  ROS:    General ROS: Positive for -headache Psychological ROS: negative for - behavioral disorder, hallucinations, memory difficulties, mood swings or suicidal ideation Ophthalmic ROS: Positive for - blurry vision ENT ROS: negative for - epistaxis, nasal discharge, oral lesions, sore throat, tinnitus or vertigo Allergy and Immunology ROS: negative for - hives or itchy/watery eyes Hematological and Lymphatic ROS: negative for - bleeding problems, bruising or swollen lymph nodes Endocrine ROS: negative for - galactorrhea, hair pattern changes, polydipsia/polyuria or temperature  intolerance Respiratory ROS: negative for - cough, hemoptysis, shortness of breath or wheezing Cardiovascular ROS: negative for - chest pain, dyspnea on exertion, edema or irregular heartbeat Gastrointestinal ROS: negative for - abdominal pain, diarrhea, hematemesis, nausea/vomiting or stool incontinence Genito-Urinary ROS: negative for - dysuria, hematuria, incontinence or urinary frequency/urgency Musculoskeletal ROS: negative for - joint swelling or muscular weakness Neurological ROS: as noted  in HPI Dermatological ROS: negative for rash and skin lesion changes  Exam: Current vital signs: BP 103/67    Pulse 88    Temp 98.2 F (36.8 C) (Oral)    Resp 18    Ht 5\' 2"  (1.575 m)    Wt 92.4 kg    SpO2 100%    BMI 37.26 kg/m  Vital signs in last 24 hours: Temp:  [98.2 F (36.8 C)] 98.2 F (36.8 C) (08/04 1116) Pulse Rate:  [88-99] 88 (08/04 1220) Resp:  [18] 18 (08/04 1116) BP: (103-107)/(56-67) 103/67 (08/04 1220) SpO2:  [92 %-100 %] 100 % (08/04 1220) Weight:  [84.8 kg-92.4 kg] 92.4 kg (08/04 1222)   Constitutional: Appears well-developed and well-nourished.  Psych: Affect appropriate to situation Eyes: No scleral injection HENT: No OP obstrucion Head: Normocephalic.  Cardiovascular: Palpable Respiratory: Effort normal, non-labored breathing GI: Soft.  No distension. There is no tenderness.  Skin: WDI  Neuro: Mental Status: Patient is awake, alert, oriented to person, place, month, year, and situation.  Speech is clear with no aphasia or dysarthria.  Naming, repeating and comprehension intact.  Patient is able to give a clear history.  Patient is able to follow commands with no difficulty. Cranial Nerves: II: Visual Fields are full.  III,IV, VI: EOMI without ptosis or diploplia. Pupils equal, round and reactive to light V: Facial sensation is symmetric to temperature VII: Facial movement is symmetric.  VIII: hearing is intact to voice X: Palat elevates symmetrically XI:  Shoulder shrug is symmetric. XII: tongue is midline without atrophy or fasciculations.  Motor: Tone is normal. Bulk is normal. 5/5 strength was present in all four extremities.  Sensory: Sensory decreased on the left Deep Tendon Reflexes: 2+ and symmetric in the biceps and patellae.  Plantars: Mute bilaterally Cerebellar: FNF and HKS are intact bilaterally  Labs I have reviewed labs in epic and the results pertinent to this consultation are:   CBC    Component Value Date/Time   WBC 11.0 (H) 12/10/2019 1149   RBC 4.60 12/10/2019 1149   HGB 14.6 12/10/2019 1159   HCT 43.0 12/10/2019 1159   PLT 288 12/10/2019 1149   MCV 92.8 12/10/2019 1149   MCH 29.1 12/10/2019 1149   MCHC 31.4 12/10/2019 1149   RDW 13.2 12/10/2019 1149   LYMPHSABS 2.6 12/10/2019 1149   MONOABS 0.9 12/10/2019 1149   EOSABS 0.2 12/10/2019 1149   BASOSABS 0.0 12/10/2019 1149    CMP     Component Value Date/Time   NA 142 12/10/2019 1159   K 3.9 12/10/2019 1159   CL 100 12/10/2019 1159   CO2 29 12/10/2019 1149   GLUCOSE 97 12/10/2019 1159   BUN 14 12/10/2019 1159   CREATININE 0.80 12/10/2019 1159   CALCIUM 9.2 12/10/2019 1149   PROT 6.7 12/10/2019 1149   ALBUMIN 3.6 12/10/2019 1149   AST 30 12/10/2019 1149   ALT 27 12/10/2019 1149   ALKPHOS 84 12/10/2019 1149   BILITOT 0.9 12/10/2019 1149   GFRNONAA >60 12/10/2019 1149   GFRAA >60 12/10/2019 1149    Lipid Panel     Component Value Date/Time   CHOL 196 03/26/2007 0020   TRIG 102 03/26/2007 0020   HDL 44 03/26/2007 0020   CHOLHDL 4.5 Ratio 03/26/2007 0020   VLDL 20 03/26/2007 0020   LDLCALC 132 (H) 03/26/2007 0020     Imaging I have reviewed the images obtained:  CT-scan of the brain/CTA of head and neck.-No CT evidence of acute intracranial  abnormality.No large vessel occlusion. Plaque at the ICA origins causes less than 50% stenosis.    Felicie Morn PA-C [facility] [phone]  M-F  (9:00 am- 5:00 PM)  12/10/2019,  12:27 PM   I have seen the patient reviewed the above note.  There was some suggestion of slightly inconsistent exam (no drift though some weakness to confrontation) as well as the presence of tingling in addition and numbness.  She does endorse a history of migraine.  Assessment:  This is a 64 year old female presented to the hospital with blurred vision and left-sided decreased sensation in the setting of headache.  Code stroke was initiated.  On exam patient continued to have blurred vision and left-sided decreased sensation.  Patient was out of the window for TPA and she did not have a large vessel occlusion.  At this point, with the positive as well as negative symptoms (tingling) as well as a headache, I do think there is a possibility this could represent complicated migraine.  She will need further evaluation with MRI, however if that is negative then I would not pursue continue work-up.  Impression: -Stroke versus complicated migraine  Recommendations: -MRI brain without contrast -Further work-up only if MRI is positive, if negative, would treat as complicated migraine.  Ritta Slot, MD [facility] [phone]  If 7pm- 7am, please page neurology on call as listed in AMION.

## 2020-07-28 ENCOUNTER — Other Ambulatory Visit (HOSPITAL_COMMUNITY): Payer: Self-pay | Admitting: Nurse Practitioner

## 2020-07-28 DIAGNOSIS — M25552 Pain in left hip: Secondary | ICD-10-CM

## 2020-07-28 DIAGNOSIS — M25551 Pain in right hip: Secondary | ICD-10-CM

## 2020-07-28 DIAGNOSIS — M546 Pain in thoracic spine: Secondary | ICD-10-CM

## 2020-07-28 DIAGNOSIS — M545 Low back pain, unspecified: Secondary | ICD-10-CM

## 2020-09-01 ENCOUNTER — Other Ambulatory Visit: Payer: Self-pay | Admitting: Family Medicine

## 2020-09-01 DIAGNOSIS — K469 Unspecified abdominal hernia without obstruction or gangrene: Secondary | ICD-10-CM

## 2020-09-23 ENCOUNTER — Ambulatory Visit: Payer: Medicare HMO | Admitting: General Surgery

## 2020-11-01 ENCOUNTER — Other Ambulatory Visit: Payer: Self-pay

## 2020-11-01 ENCOUNTER — Other Ambulatory Visit (HOSPITAL_COMMUNITY): Payer: Self-pay | Admitting: Nurse Practitioner

## 2020-11-01 ENCOUNTER — Ambulatory Visit (HOSPITAL_COMMUNITY)
Admission: RE | Admit: 2020-11-01 | Discharge: 2020-11-01 | Disposition: A | Discharge status: Home / Self Care | Payer: Medicare HMO | Source: Ambulatory Visit | Attending: Nurse Practitioner | Admitting: Nurse Practitioner

## 2020-11-01 DIAGNOSIS — J45909 Unspecified asthma, uncomplicated: Secondary | ICD-10-CM

## 2020-11-01 DIAGNOSIS — M25551 Pain in right hip: Secondary | ICD-10-CM

## 2020-11-01 DIAGNOSIS — M25552 Pain in left hip: Secondary | ICD-10-CM

## 2020-11-01 DIAGNOSIS — M545 Low back pain, unspecified: Secondary | ICD-10-CM | POA: Diagnosis not present

## 2020-11-01 DIAGNOSIS — M546 Pain in thoracic spine: Secondary | ICD-10-CM

## 2021-01-30 NOTE — Progress Notes (Signed)
CARDIOLOGY CONSULT NOTE       Patient ID: [name] MRN: [id number] DOB/AGE: 1955-10-01 65 y.o.  Admit date: (Not on file) Referring Physician: Sudie Bailey Primary Physician: Gareth Morgan, MD Primary Cardiologist: New Reason for Consultation: Dyspnea/Palpitations   Active Problems:   * No active hospital problems. *   HPI:  65 y.o. referred by Dr Sudie Bailey for dyspnea and palpitations .  History of Asthma, Anxiety, chronic pain syndrome. Had atypical chest pain in 2007 Has had normal cath in 2005 and 2007  TTE 2003 normal EF She has history of DVT and ? MVP.  ? Her son was diagnosed with HOCM Vague history of syncope with no cardiac etiology ? Complex migraine seen in ER 12/10/19 with negative MRI to f/u with neurology She is on morphine for chronic back pain   She complains of daily palpitations associated with dyspnea She is on Ativan for anxiety She Is frustrated by inability to lose weight despite avoiding "Junk" food   She is raising her 22 yo grand daughter Son had baby with first wife/girlfriend and Yuvia has had custody since age 49 weeks Son is now married to a different woman. He has learning disability and HOCM [facility]   She walks 3 miles/day Palpitations are mostly flips Occur daily   ECG in office today NSR rate 78 totally normal   ROS All other systems reviewed and negative except as noted above  Past Medical History:  Diagnosis Date   Allergic rhinitis    Anxiety    Arthritis    Asthma    Atypical chest pain    cath 2007 showing normal coronary arteries, last echo 2003 with normal EF and no systolic dysfunction   Broken ribs    Trauma falling off of horse November 2012   Chest pain    Chronic pain syndrome    CVA (cerebral infarction)    reported by patient, no documentation   Depression    history of prior suicide attempts   DVT (deep venous thrombosis) (HCC)    remote at age 65   GERD (gastroesophageal reflux disease)    Headache     Herniated nucleus pulposus, L4-5 left 2001   s/p Left microsurgical exploration L4-5 and microdiskectomy with lysis   Hypotension    history of hypotension in the past requiring fluid boluses   Hypothyroidism    Mitral valve prolapse    Mitral valve prolapse    Pneumonia 08/29/2011   Pseudoarthrosis of lumbar spine    Seizures (HCC)    in the 1990's; takes neurontin for this;no seizures since then.   Shortness of breath    Stroke (HCC)    hx of TIA   TIA (transient ischemic attack)     Family History  Problem Relation Age of Onset   Cancer Mother        Uterine   Cirrhosis Mother    Alcohol abuse Mother    Cancer Father        brain   Esophageal cancer Father    Alcohol abuse Father    Heart attack Brother        CABG   Hypertrophic cardiomyopathy Son    Cancer Maternal Aunt        cancer   Cancer Maternal Aunt        breast   Cancer Maternal Aunt        breast   Cancer Cousin        breast  Cancer Cousin        breast   Cancer Cousin        breast   Other Other        died due to perforated colon after colonoscopy    Social History   Socioeconomic History   Marital status: Divorced    Spouse name: Not on file   Number of children: 3   Years of education: Not on file   Highest education level: Not on file  Occupational History   Occupation: Disability  Tobacco Use   Smoking status: Never   Smokeless tobacco: Never  Vaping Use   Vaping Use: Never used  Substance and Sexual Activity   Alcohol use: No    Alcohol/week: 0.0 standard drinks   Drug use: No   Sexual activity: Yes    Birth control/protection: Post-menopausal, Surgical  Other Topics Concern   Not on file  Social History Narrative   Has 2 sons and 1 daughter   Lives with husband in [place]   Social Determinants of Health   Financial Resource Strain: Not on file  Food Insecurity: Not on file  Transportation Needs: Not on file  Physical Activity: Not on file  Stress: Not on file   Social Connections: Not on file  Intimate Partner Violence: Not on file    Past Surgical History:  Procedure Laterality Date   ABDOMINAL HYSTERECTOMY     APPENDECTOMY     BACK SURGERY  05/2016   BACK SURGERY  11/2017   BREAST BIOPSY Right    neg   CERVICAL SPINE SURGERY     CHOLECYSTECTOMY     cholescystectomy  2003    Laparoscopic cholecystectomy with intraoperative   ELBOW SURGERY Right    3 different surgeries    INCISIONAL HERNIA REPAIR N/A 01/21/2016   Procedure: HERNIA REPAIR INCISIONAL WITH MESH;  Surgeon: Franky Macho, MD;  Location: [facility];  Service: General;  Laterality: N/A;   KNEE SURGERY Bilateral    lower back surgery     NERVE, TENDON AND ARTERY REPAIR Left 05/09/2013   Procedure: LEFT WRIST EXPLORATION ;  Surgeon: Tami Ribas, MD;  Location: [facility];  Service: Orthopedics;  Laterality: Left;   OOPHORECTOMY     right arm surgery     carpal tunnel; and tendon repair of elbow.   SHOULDER SURGERY Right    TONSILLECTOMY        Current Outpatient Medications:    albuterol (PROVENTIL HFA;VENTOLIN HFA) 108 (90 BASE) MCG/ACT inhaler, Inhale 2 puffs into the lungs every 6 (six) hours as needed for wheezing., Disp: 1 Inhaler, Rfl: 0   albuterol (PROVENTIL) (2.5 MG/3ML) 0.083% nebulizer solution, Take 2.5 mg by nebulization 4 (four) times daily as needed for wheezing or shortness of breath. , Disp: , Rfl:    cyclobenzaprine (FLEXERIL) 10 MG tablet, Take 1 tablet (10 mg total) by mouth 3 (three) times daily as needed for muscle spasms., Disp: , Rfl:    FLUoxetine (PROZAC) 20 MG capsule, Take 60 mg by mouth every morning., Disp: , Rfl:    fluticasone (FLONASE) 50 MCG/ACT nasal spray, Place 1-2 sprays into both nostrils daily as needed for allergies or rhinitis. , Disp: , Rfl:    gabapentin (NEURONTIN) 300 MG capsule, Take 900-1,800 mg by mouth See admin instructions. Take 900 mg by mouth in the morning, 900 mg at 2 PM, and 1,800 mg at bedtime, Disp: , Rfl:     hydrOXYzine (VISTARIL) 25 MG capsule,  Take 50 mg by mouth at bedtime., Disp: , Rfl:    levothyroxine (SYNTHROID, LEVOTHROID) 75 MCG tablet, Take 75 mcg by mouth daily before breakfast., Disp: , Rfl:    LORazepam (ATIVAN) 1 MG tablet, Take 1 mg by mouth daily as needed for anxiety. , Disp: , Rfl:    meloxicam (MOBIC) 15 MG tablet, Take 15 mg by mouth daily. , Disp: , Rfl:    methylphenidate (RITALIN) 10 MG tablet, Take 10 mg by mouth 2 (two) times daily as needed (ADD or when studying). , Disp: , Rfl:    metoprolol succinate (TOPROL-XL) 50 MG 24 hr tablet, Take 50 mg by mouth daily. Take with or immediately following a meal., Disp: , Rfl:    morphine (MSIR) 15 MG tablet, Take 15 mg by mouth at bedtime., Disp: , Rfl:    omeprazole (PRILOSEC) 20 MG capsule, Take 1 capsule (20 mg total) by mouth 2 (two) times daily before a meal. (Patient taking differently: Take 20 mg by mouth in the morning and at bedtime.), Disp: 180 capsule, Rfl: 1   rOPINIRole (REQUIP) 3 MG tablet, Take 3 mg by mouth at bedtime., Disp: , Rfl:     Physical Exam: Blood pressure 136/84, pulse 78, height 5\' 2"  (1.575 m), weight 93.9 kg, SpO2 96 %.    Affect appropriate Healthy:  appears stated age HEENT: normal Neck supple with no adenopathy JVP normal no bruits no thyromegaly Lungs clear with no wheezing and good diaphragmatic motion Heart:  S1/S2 no murmur, no rub, gallop or click PMI normal Abdomen: benighn, BS positve, no tenderness, no AAA no bruit.  No HSM or HJR Distal pulses intact with no bruits No edema Neuro non-focal Skin warm and dry No muscular weakness   Labs:   Lab Results  Component Value Date   WBC 11.0 (H) 12/10/2019   HGB 14.6 12/10/2019   HCT 43.0 12/10/2019   MCV 92.8 12/10/2019   PLT 288 12/10/2019   No results for input(s): NA, K, CL, CO2, BUN, CREATININE, CALCIUM, PROT, BILITOT, ALKPHOS, ALT, AST, GLUCOSE in the last 168 hours.  Invalid input(s): LABALBU Lab Results  Component Value  Date   CKTOTAL 252 (H) 08/30/2011   CKMB 4.2 (H) 08/30/2011   TROPONINI <0.03 06/08/2017    Lab Results  Component Value Date   CHOL 196 03/26/2007   Lab Results  Component Value Date   HDL 44 03/26/2007   Lab Results  Component Value Date   LDLCALC 132 (H) 03/26/2007   Lab Results  Component Value Date   TRIG 102 03/26/2007   Lab Results  Component Value Date   CHOLHDL 4.5 Ratio 03/26/2007   No results found for: LDLDIRECT    Radiology: No results found.  EKG: 12/11/19 ST rate 104 otherwise normal    ASSESSMENT AND PLAN:   Dyspnea:  functional check TTE with history of MVP no murmur on exam  Asthma:  no active wheezing PRN proventil  Hypothyroidism:  continue synthroid replacement TSH with primary  Chronic Pain: she has requip, flexeril, mobic and neurontin listed on her med list no SSRI f/u primary  Anxiety/Depression:  on ativan, prozac and Ritalin stable    14 day monitor  TTE  F/U PRN if benign   Signed: 02/10/20 02/04/2021, 9:35 AM

## 2021-02-04 ENCOUNTER — Ambulatory Visit (INDEPENDENT_AMBULATORY_CARE_PROVIDER_SITE_OTHER): Payer: Medicare HMO | Admitting: Cardiovascular Disease

## 2021-02-04 ENCOUNTER — Ambulatory Visit: Payer: Medicare HMO

## 2021-02-04 ENCOUNTER — Encounter: Payer: Self-pay | Admitting: Cardiovascular Disease

## 2021-02-04 ENCOUNTER — Other Ambulatory Visit: Payer: Self-pay

## 2021-02-04 ENCOUNTER — Telehealth: Payer: Self-pay | Admitting: Cardiovascular Disease

## 2021-02-04 VITALS — BP 136/84 | HR 78 | Ht 62.0 in | Wt 207.0 lb

## 2021-02-04 DIAGNOSIS — R002 Palpitations: Secondary | ICD-10-CM | POA: Diagnosis not present

## 2021-02-04 DIAGNOSIS — R0600 Dyspnea, unspecified: Secondary | ICD-10-CM | POA: Diagnosis not present

## 2021-02-04 DIAGNOSIS — R0609 Other forms of dyspnea: Secondary | ICD-10-CM

## 2021-02-04 DIAGNOSIS — E039 Hypothyroidism, unspecified: Secondary | ICD-10-CM

## 2021-02-04 DIAGNOSIS — I341 Nonrheumatic mitral (valve) prolapse: Secondary | ICD-10-CM

## 2021-02-04 NOTE — Patient Instructions (Signed)
Medication Instructions:  Your physician recommends that you continue on your current medications as directed. Please refer to the Current Medication list given to you today.  *If you need a refill on your cardiac medications before your next appointment, please call your pharmacy*   Lab Work: NONE   If you have labs (blood work) drawn today and your tests are completely normal, you will receive your results only by: MyChart Message (if you have MyChart) OR A paper copy in the mail If you have any lab test that is abnormal or we need to change your treatment, we will call you to review the results.   Testing/Procedures: Your physician has requested that you have an echocardiogram. Echocardiography is a painless test that uses sound waves to create images of your heart. It provides your doctor with information about the size and shape of your heart and how well your heart's chambers and valves are working. This procedure takes approximately one hour. There are no restrictions for this procedure.  ZIO XT- Long Term Monitor Instructions   Your physician has requested you wear your ZIO patch monitor_______days.   This is a single patch monitor.  [employer] supplies one patch monitor per enrollment.  Additional stickers are not available.   Please do not apply patch if you will be having a Nuclear Stress Test, Echocardiogram, Cardiac CT, MRI, or Chest Xray during the time frame you would be wearing the monitor. The patch cannot be worn during these tests.  You cannot remove and re-apply the ZIO XT patch monitor.   Your ZIO patch monitor will be sent USPS Priority mail from [facility] directly to your home address. The monitor may also be mailed to a PO BOX if home delivery is not available.   It may take 3-5 days to receive your monitor after you have been enrolled.   Once you have received you monitor, please review enclosed instructions.  Your monitor has already been registered  assigning a specific monitor serial # to you.   Applying the monitor   Shave hair from upper left chest.   Hold abrader disc by orange tab.  Rub abrader in 40 strokes over left upper chest as indicated in your monitor instructions.   Clean area with 4 enclosed alcohol pads .  Use all pads to assure are is cleaned thoroughly.  Let dry.   Apply patch as indicated in monitor instructions.  Patch will be place under collarbone on left side of chest with arrow pointing upward.   Rub patch adhesive wings for 2 minutes.Remove white label marked "1".  Remove white label marked "2".  Rub patch adhesive wings for 2 additional minutes.   While looking in a mirror, press and release button in center of patch.  A small green light will flash 3-4 times .  This will be your only indicator the monitor has been turned on.     Do not shower for the first 24 hours.  You may shower after the first 24 hours.   Press button if you feel a symptom. You will hear a small click.  Record Date, Time and Symptom in the Patient Log Book.   When you are ready to remove patch, follow instructions on last 2 pages of Patient Log Book.  Stick patch monitor onto last page of Patient Log Book.   Place Patient Log Book in [place] box.  Use locking tab on box and tape box closed securely.  The [place] [employer]  has prepaid postage on it.  Please place in mailbox as soon as possible.  Your physician should have your test results approximately 7 days after the monitor has been mailed back to [facility].   Call [facility] Customer Care at [phone] if you have questions regarding your ZIO XT patch monitor.  Call them immediately if you see an orange light blinking on your monitor.   If your monitor falls off in less than 4 days contact our Monitor department at [phone].  If your monitor becomes loose or falls off after 4 days call [employer] at [phone] for suggestions on securing your monitor.      Follow-Up: At [facility], you and your health needs are our priority.  As part of our continuing mission to provide you with exceptional heart care, we have created designated Provider Care Teams.  These Care Teams include your primary Cardiologist (physician) and Advanced Practice Providers (APPs -  Physician Assistants and Nurse Practitioners) who all work together to provide you with the care you need, when you need it.  We recommend signing up for the patient portal called "MyChart".  Sign up information is provided on this After Visit Summary.  MyChart is used to connect with patients for Virtual Visits (Telemedicine).  Patients are able to view lab/test results, encounter notes, upcoming appointments, etc.  Non-urgent messages can be sent to your provider as well.   To learn more about what you can do with MyChart, go to [email or web address].    Your next appointment:    As Needed   The format for your next appointment:   In Person  Provider:   Charlton Haws, MD   Other Instructions Thank you for choosing [facility]!

## 2021-02-04 NOTE — Telephone Encounter (Signed)
Checking percert on the following patient for testing scheduled at [facility].      ECHO - 03/04/2021  14 DAY ZIO MONITOR

## 2021-03-04 ENCOUNTER — Ambulatory Visit (HOSPITAL_COMMUNITY): Admission: RE | Admit: 2021-03-04 | Payer: Medicare HMO | Source: Ambulatory Visit

## 2021-04-21 ENCOUNTER — Ambulatory Visit (INDEPENDENT_AMBULATORY_CARE_PROVIDER_SITE_OTHER): Payer: Medicare HMO

## 2021-04-21 ENCOUNTER — Encounter: Payer: Self-pay | Admitting: Emergency Medicine

## 2021-04-21 ENCOUNTER — Other Ambulatory Visit: Payer: Self-pay

## 2021-04-21 ENCOUNTER — Ambulatory Visit
Admission: EM | Admit: 2021-04-21 | Discharge: 2021-04-21 | Disposition: A | Discharge status: Home / Self Care | Payer: Medicare HMO | Attending: Urgent Care | Admitting: Urgent Care

## 2021-04-21 DIAGNOSIS — J4540 Moderate persistent asthma, uncomplicated: Secondary | ICD-10-CM

## 2021-04-21 DIAGNOSIS — R059 Cough, unspecified: Secondary | ICD-10-CM | POA: Diagnosis not present

## 2021-04-21 DIAGNOSIS — B349 Viral infection, unspecified: Secondary | ICD-10-CM | POA: Diagnosis not present

## 2021-04-21 DIAGNOSIS — R052 Subacute cough: Secondary | ICD-10-CM | POA: Diagnosis not present

## 2021-04-21 MED ORDER — PREDNISONE 50 MG PO TABS: 50.0000 mg | ORAL_TABLET | Freq: Every day | ORAL | 0 refills | Status: AC

## 2021-04-21 NOTE — ED Provider Notes (Signed)
[facility]   MRN: [id number] DOB: 1955/08/10  Subjective:   [name] is a 65 y.o. female presenting for 1 week history of persistent productive cough. Went to her PCP and was prescribed azithromycin. No imaging was done. She took one dose and started itching quite a bit. She stopped taking it. Normally she gets 10 days of Biaxin from her PCP but they were not available. Has a history of allergic rhinitis, asthma.   No current facility-administered medications for this encounter.  Current Outpatient Medications:    albuterol (PROVENTIL HFA;VENTOLIN HFA) 108 (90 BASE) MCG/ACT inhaler, Inhale 2 puffs into the lungs every 6 (six) hours as needed for wheezing., Disp: 1 Inhaler, Rfl: 0   albuterol (PROVENTIL) (2.5 MG/3ML) 0.083% nebulizer solution, Take 2.5 mg by nebulization 4 (four) times daily as needed for wheezing or shortness of breath. , Disp: , Rfl:    cyclobenzaprine (FLEXERIL) 10 MG tablet, Take 1 tablet (10 mg total) by mouth 3 (three) times daily as needed for muscle spasms., Disp: , Rfl:    FLUoxetine (PROZAC) 20 MG capsule, Take 60 mg by mouth every morning., Disp: , Rfl:    fluticasone (FLONASE) 50 MCG/ACT nasal spray, Place 1-2 sprays into both nostrils daily as needed for allergies or rhinitis. , Disp: , Rfl:    gabapentin (NEURONTIN) 300 MG capsule, Take 900-1,800 mg by mouth See admin instructions. Take 900 mg by mouth in the morning, 900 mg at 2 PM, and 1,800 mg at bedtime, Disp: , Rfl:    hydrOXYzine (VISTARIL) 25 MG capsule, Take 50 mg by mouth at bedtime., Disp: , Rfl:    levothyroxine (SYNTHROID, LEVOTHROID) 75 MCG tablet, Take 75 mcg by mouth daily before breakfast., Disp: , Rfl:    LORazepam (ATIVAN) 1 MG tablet, Take 1 mg by mouth daily as needed for anxiety. , Disp: , Rfl:    meloxicam (MOBIC) 15 MG tablet, Take 15 mg by mouth daily. , Disp: , Rfl:    methylphenidate (RITALIN) 10 MG tablet, Take 10 mg by mouth 2 (two) times daily as needed (ADD  or when studying). , Disp: , Rfl:    metoprolol succinate (TOPROL-XL) 50 MG 24 hr tablet, Take 50 mg by mouth daily. Take with or immediately following a meal., Disp: , Rfl:    morphine (MSIR) 15 MG tablet, Take 15 mg by mouth at bedtime., Disp: , Rfl:    omeprazole (PRILOSEC) 20 MG capsule, Take 1 capsule (20 mg total) by mouth 2 (two) times daily before a meal. (Patient taking differently: Take 20 mg by mouth in the morning and at bedtime.), Disp: 180 capsule, Rfl: 1   rOPINIRole (REQUIP) 3 MG tablet, Take 3 mg by mouth at bedtime., Disp: , Rfl:    Allergies  Allergen Reactions   Amoxicillin Hives, Rash and Other (See Comments)    Stomach bleeding, also    Ampicillin Rash and Other (See Comments)    Stomach bleeding, also   Aspirin Other (See Comments)    Ulcers and GI bleeding. Patient states she can take the  aspirin, but not 325 MG-   Bee Venom Anaphylaxis   Chlorzoxazone Rash and Other (See Comments)    Caused pleural effusion/excessive fluid buildup   Ciprofloxacin Anaphylaxis, Swelling and Other (See Comments)    Throat swells shut   Heparin Other (See Comments)    HEART STOPS BEATING   Indomethacin Other (See Comments)    HEART STOPS BEATING   Nsaids Other (See Comments)  Stomach bleeding   Penicillins Hives, Shortness Of Breath, Swelling and Other (See Comments)    Has patient had a PCN reaction causing immediate rash, facial/tongue/throat swelling, SOB or lightheadedness with hypotension: Yes Has patient had a PCN reaction causing severe rash involving mucus membranes or skin necrosis: Yes Has patient had a PCN reaction that required hospitalization: Unknown Has patient had a PCN reaction occurring within the last 10 years: No If all of the above answers are "NO", then may proceed with Cephalosporin use.    Sulfa Antibiotics Other (See Comments)    Per patient, can not take sulfa antibiotics, patient unsure her reaction but knows she "cant have it"   Albumin  (Human) Rash and Other (See Comments)    Unknown other reactions   Cephalexin Hives, Itching and Rash   Chlorpromazine Rash and Other (See Comments)    Hyperactivity, also   Darvocet [Propoxyphene N-Acetaminophen] Itching, Nausea And Vomiting and Rash   Dilaudid [Hydromorphone Hcl] Itching, Nausea And Vomiting and Swelling   Fentanyl Hives and Nausea And Vomiting   Hydrocodone Nausea And Vomiting   Ketorolac Tromethamine Itching, Nausea And Vomiting and Rash   Latex Hives   Nortriptyline Hcl Itching, Rash and Other (See Comments)    Hyperactivity, also   Oxycodone Nausea And Vomiting   Pentazocine Nausea And Vomiting and Rash   Pentazocine-Naloxone Hcl Nausea And Vomiting   Percocet [Oxycodone-Acetaminophen] Itching, Nausea And Vomiting and Rash   Propoxyphene Itching, Nausea And Vomiting and Rash    Darvon   Sertraline Hcl Other (See Comments)    Hyperactive - bounces off the wall   Tramadol Hcl Nausea And Vomiting and Rash   Levothyroxine Nausea And Vomiting   Zithromax [Azithromycin]    Codeine Rash   Hyoscyamine Sulfate Other (See Comments)   Imipramine Itching and Rash   Tape Rash and Other (See Comments)    Must be hypo-allergenic tape!! No powdered gloves, either!!   Tramadol Nausea And Vomiting and Rash    Past Medical History:  Diagnosis Date   Allergic rhinitis    Anxiety    Arthritis    Asthma    Atypical chest pain    cath 2007 showing normal coronary arteries, last echo 2003 with normal EF and no systolic dysfunction   Broken ribs    Trauma falling off of horse November 2012   Chest pain    Chronic pain syndrome    CVA (cerebral infarction)    reported by patient, no documentation   Depression    history of prior suicide attempts   DVT (deep venous thrombosis) (HCC)    remote at age 83   GERD (gastroesophageal reflux disease)    Headache    Herniated nucleus pulposus, L4-5 left 2001   s/p Left microsurgical exploration L4-5 and microdiskectomy with  lysis   Hypotension    history of hypotension in the past requiring fluid boluses   Hypothyroidism    Mitral valve prolapse    Mitral valve prolapse    Pneumonia 08/29/2011   Pseudoarthrosis of lumbar spine    Seizures (HCC)    in the 1990's; takes neurontin for this;no seizures since then.   Shortness of breath    Stroke (HCC)    hx of TIA   TIA (transient ischemic attack)      Past Surgical History:  Procedure Laterality Date   ABDOMINAL HYSTERECTOMY     APPENDECTOMY     BACK SURGERY  05/2016   BACK SURGERY  11/2017  BREAST BIOPSY Right    neg   CERVICAL SPINE SURGERY     CHOLECYSTECTOMY     cholescystectomy  2003    Laparoscopic cholecystectomy with intraoperative   ELBOW SURGERY Right    3 different surgeries    INCISIONAL HERNIA REPAIR N/A 01/21/2016   Procedure: HERNIA REPAIR INCISIONAL WITH MESH;  Surgeon: Franky Macho, MD;  Location: [facility];  Service: General;  Laterality: N/A;   KNEE SURGERY Bilateral    lower back surgery     NERVE, TENDON AND ARTERY REPAIR Left 05/09/2013   Procedure: LEFT WRIST EXPLORATION ;  Surgeon: Tami Ribas, MD;  Location: [facility];  Service: Orthopedics;  Laterality: Left;   OOPHORECTOMY     right arm surgery     carpal tunnel; and tendon repair of elbow.   SHOULDER SURGERY Right    TONSILLECTOMY      Family History  Problem Relation Age of Onset   Cancer Mother        Uterine   Cirrhosis Mother    Alcohol abuse Mother    Cancer Father        brain   Esophageal cancer Father    Alcohol abuse Father    Heart attack Brother        CABG   Hypertrophic cardiomyopathy Son    Cancer Maternal Aunt        cancer   Cancer Maternal Aunt        breast   Cancer Maternal Aunt        breast   Cancer Cousin        breast   Cancer Cousin        breast   Cancer Cousin        breast   Other Other        died due to perforated colon after colonoscopy    Social History   Tobacco Use   Smoking status: Never    Smokeless tobacco: Never  Vaping Use   Vaping Use: Never used  Substance Use Topics   Alcohol use: No    Alcohol/week: 0.0 standard drinks   Drug use: No    ROS   Objective:   Vitals: BP 125/85 (BP Location: Right Arm)    Pulse (!) 103    Temp 99.5 F (37.5 C) (Oral)    Resp 18    SpO2 93%   Physical Exam Constitutional:      General: She is not in acute distress.    Appearance: Normal appearance. She is well-developed. She is not ill-appearing, toxic-appearing or diaphoretic.  HENT:     Head: Normocephalic and atraumatic.     Nose: Nose normal.     Mouth/Throat:     Mouth: Mucous membranes are moist.  Eyes:     Extraocular Movements: Extraocular movements intact.     Pupils: Pupils are equal, round, and reactive to light.  Cardiovascular:     Rate and Rhythm: Normal rate and regular rhythm.     Pulses: Normal pulses.     Heart sounds: Normal heart sounds. No murmur heard.   No friction rub. No gallop.  Pulmonary:     Effort: Pulmonary effort is normal. No respiratory distress.     Breath sounds: Normal breath sounds. No stridor. No wheezing, rhonchi or rales.  Skin:    General: Skin is warm and dry.     Findings: No rash.  Neurological:     Mental Status: She is alert  and oriented to person, place, and time.  Psychiatric:        Mood and Affect: Mood normal.        Behavior: Behavior normal.        Thought Content: Thought content normal.   DG Chest 2 View  Result Date: 04/21/2021 CLINICAL DATA:  Cough EXAM: CHEST - 2 VIEW COMPARISON:  Chest x-ray dated April 01, 2018 FINDINGS: The heart size and mediastinal contours are within normal limits. Both lungs are clear. The visualized skeletal structures are unremarkable. IMPRESSION: No active cardiopulmonary disease. Electronically Signed   By: Allegra Lai M.D.   On: 04/21/2021 14:25    Assessment and Plan :   PDMP not reviewed this encounter.  1. Acute viral syndrome   2. Moderate persistent asthma  without complication   3. Subacute cough    Recommended an oral prednisone course.  Use supportive care otherwise as recommended by her PCP practice. Counseled patient on potential for adverse effects with medications prescribed/recommended today, ER and return-to-clinic precautions discussed, patient verbalized understanding.    Wallis Bamberg, PA-C 04/21/21 1434

## 2021-04-21 NOTE — ED Triage Notes (Signed)
Productive Cough x 1 week with green sputum.  PCP gave her a z-pack and she states she is allergic to this medication.

## 2021-05-02 ENCOUNTER — Encounter (HOSPITAL_COMMUNITY): Payer: Self-pay

## 2021-05-02 ENCOUNTER — Emergency Department (HOSPITAL_COMMUNITY)
Admission: EM | Admit: 2021-05-02 | Discharge: 2021-05-02 | Disposition: A | Discharge status: Home / Self Care | Payer: Medicare HMO | Attending: Emergency Medicine | Admitting: Emergency Medicine

## 2021-05-02 ENCOUNTER — Other Ambulatory Visit: Payer: Self-pay

## 2021-05-02 DIAGNOSIS — I10 Essential (primary) hypertension: Secondary | ICD-10-CM | POA: Insufficient documentation

## 2021-05-02 DIAGNOSIS — B370 Candidal stomatitis: Secondary | ICD-10-CM

## 2021-05-02 DIAGNOSIS — Z91040 Latex allergy status: Secondary | ICD-10-CM | POA: Diagnosis not present

## 2021-05-02 DIAGNOSIS — E039 Hypothyroidism, unspecified: Secondary | ICD-10-CM | POA: Diagnosis not present

## 2021-05-02 DIAGNOSIS — Z79899 Other long term (current) drug therapy: Secondary | ICD-10-CM | POA: Insufficient documentation

## 2021-05-02 DIAGNOSIS — B379 Candidiasis, unspecified: Secondary | ICD-10-CM | POA: Diagnosis present

## 2021-05-02 DIAGNOSIS — J45909 Unspecified asthma, uncomplicated: Secondary | ICD-10-CM | POA: Diagnosis not present

## 2021-05-02 LAB — CBG MONITORING, ED: Glucose-Capillary: 110 mg/dL — ABNORMAL HIGH (ref 70–99)

## 2021-05-02 MED ORDER — EPINEPHRINE 0.3 MG/0.3ML IJ SOAJ
INTRAMUSCULAR | Status: AC
  Filled 2021-05-02: qty 0.3

## 2021-05-02 MED ORDER — NYSTATIN 100000 UNIT/ML MT SUSP: 500000.0000 [IU] | Freq: Four times a day (QID) | OROMUCOSAL | 0 refills | Status: AC

## 2021-05-02 MED ORDER — NYSTATIN 100000 UNIT/ML MT SUSP
5.0000 mL | Freq: Once | OROMUCOSAL | Status: AC
  Administered 2021-05-02: 21:00:00 500000 [IU] via ORAL
  Filled 2021-05-02: qty 5

## 2021-05-02 NOTE — ED Triage Notes (Signed)
Pt to ED by POV from home with c/o potential allergic reaction. Pt states she recently was seen for bronchitis and given antibiotics. Pt speaking in complete sentences, sat is 100% in triage. VSS, NADN. MD at bedside.

## 2021-05-02 NOTE — Discharge Instructions (Addendum)
It looks as if you may have a yeast infection in your mouth.  The antifungal should help.  Follow-up with your doctor as needed

## 2021-05-02 NOTE — ED Provider Notes (Signed)
[facility] EMERGENCY DEPARTMENT Provider Note   CSN: [id number] Arrival date & time: 05/02/21  1910     History Chief Complaint  Patient presents with   Allergic Reaction    [name] is a 65 y.o. female.   Allergic Reaction Presenting symptoms: no difficulty swallowing   Patient is worried she is having allergic reaction.  Recently diagnosed with bronchitis.  States she been given antibiotics but reviewing notes it appears she may have been given steroids.  States she was doing well.  The visit was on 15 December.  Today began to have white spots on her throat and tongue.  States she feels of her throat is closing up.  Does have a history of numerous allergies.  Patient's allergy list shows 35 different medications.  No fevers or chills.  Still occasional cough.  Patient is not diabetic.  Patient states she found some white discharge on her tongue.  States she scrubbed off the toothbrush and then it started to return.    Past Medical History:  Diagnosis Date   Allergic rhinitis    Anxiety    Arthritis    Asthma    Atypical chest pain    cath 2007 showing normal coronary arteries, last echo 2003 with normal EF and no systolic dysfunction   Broken ribs    Trauma falling off of horse November 2012   Chest pain    Chronic pain syndrome    CVA (cerebral infarction)    reported by patient, no documentation   Depression    history of prior suicide attempts   DVT (deep venous thrombosis) (HCC)    remote at age 45   GERD (gastroesophageal reflux disease)    Headache    Herniated nucleus pulposus, L4-5 left 2001   s/p Left microsurgical exploration L4-5 and microdiskectomy with lysis   Hypotension    history of hypotension in the past requiring fluid boluses   Hypothyroidism    Mitral valve prolapse    Mitral valve prolapse    Pneumonia 08/29/2011   Pseudoarthrosis of lumbar spine    Seizures (HCC)    in the 1990's; takes neurontin for this;no seizures since then.    Shortness of breath    Stroke (HCC)    hx of TIA   TIA (transient ischemic attack)     Patient Active Problem List   Diagnosis Date Noted   Positive blood culture 04/07/2019   Abnormal weight loss 01/14/2019   Nausea with vomiting 01/14/2019   RUQ pain 01/14/2019   Pseudoarthrosis of lumbar spine 11/26/2017   Acute bronchitis 06/09/2017   Spondylolisthesis at L4-L5 level 06/05/2016   Chest pain 08/29/2011   Dyspnea 03/15/2011   OPEN WOUND FT NO TOE ALONE WITHOUT MENTION COMP 05/22/2007   VIRAL INFECTION 05/07/2007   NASOPHARYNGITIS, ACUTE 05/07/2007   OBESITY 03/25/2007   Anxiety state 03/25/2007   DEPRESSION 03/25/2007   Chronic pain syndrome 03/25/2007   MIGRAINE HEADACHE 03/25/2007   CARPAL TUNNEL SYNDROME 03/25/2007   NEUROPATHY 03/25/2007   ABNORMAL HEART RHYTHMS 03/25/2007   ALLERGIC RHINITIS 03/25/2007   BRONCHITIS, CHRONIC 03/25/2007   ASTHMA 03/25/2007   GERD 03/25/2007   PUD 03/25/2007   IBS 03/25/2007   ARTHRITIS 03/25/2007   LOW BACK PAIN, CHRONIC 03/25/2007   FIBROMYALGIA 03/25/2007   Seizure disorder (HCC) 03/25/2007   MEMORY LOSS 03/25/2007   ABDOMINAL PAIN 03/25/2007    Past Surgical History:  Procedure Laterality Date   ABDOMINAL HYSTERECTOMY     APPENDECTOMY  BACK SURGERY  05/2016   BACK SURGERY  11/2017   BREAST BIOPSY Right    neg   CERVICAL SPINE SURGERY     CHOLECYSTECTOMY     cholescystectomy  2003    Laparoscopic cholecystectomy with intraoperative   ELBOW SURGERY Right    3 different surgeries    INCISIONAL HERNIA REPAIR N/A 01/21/2016   Procedure: HERNIA REPAIR INCISIONAL WITH MESH;  Surgeon: Franky Macho, MD;  Location: [facility];  Service: General;  Laterality: N/A;   KNEE SURGERY Bilateral    lower back surgery     NERVE, TENDON AND ARTERY REPAIR Left 05/09/2013   Procedure: LEFT WRIST EXPLORATION ;  Surgeon: Tami Ribas, MD;  Location: [facility];  Service: Orthopedics;  Laterality: Left;   OOPHORECTOMY      right arm surgery     carpal tunnel; and tendon repair of elbow.   SHOULDER SURGERY Right    TONSILLECTOMY       OB History   No obstetric history on file.     Family History  Problem Relation Age of Onset   Cancer Mother        Uterine   Cirrhosis Mother    Alcohol abuse Mother    Cancer Father        brain   Esophageal cancer Father    Alcohol abuse Father    Heart attack Brother        CABG   Hypertrophic cardiomyopathy Son    Cancer Maternal Aunt        cancer   Cancer Maternal Aunt        breast   Cancer Maternal Aunt        breast   Cancer Cousin        breast   Cancer Cousin        breast   Cancer Cousin        breast   Other Other        died due to perforated colon after colonoscopy    Social History   Tobacco Use   Smoking status: Never   Smokeless tobacco: Never  Vaping Use   Vaping Use: Never used  Substance Use Topics   Alcohol use: No    Alcohol/week: 0.0 standard drinks   Drug use: No    Home Medications Prior to Admission medications   Medication Sig Start Date End Date Taking? Authorizing Provider  nystatin (MYCOSTATIN) 100000 UNIT/ML suspension Take 5 mLs (500,000 Units total) by mouth 4 (four) times daily. 05/02/21  Yes Benjiman Core, MD  albuterol (PROVENTIL HFA;VENTOLIN HFA) 108 (90 BASE) MCG/ACT inhaler Inhale 2 puffs into the lungs every 6 (six) hours as needed for wheezing. 08/30/11   Danley Danker, MD  albuterol (PROVENTIL) (2.5 MG/3ML) 0.083% nebulizer solution Take 2.5 mg by nebulization 4 (four) times daily as needed for wheezing or shortness of breath.  08/19/19   [provider]  cyclobenzaprine (FLEXERIL) 10 MG tablet Take 1 tablet (10 mg total) by mouth 3 (three) times daily as needed for muscle spasms. 04/08/19   Johnson, Clanford L, MD  FLUoxetine (PROZAC) 20 MG capsule Take 60 mg by mouth every morning. 03/24/19   [provider]  fluticasone (FLONASE) 50 MCG/ACT nasal spray Place 1-2 sprays into  both nostrils daily as needed for allergies or rhinitis.     [provider]  gabapentin (NEURONTIN) 300 MG capsule Take 900-1,800 mg by mouth See admin instructions. Take 900 mg by  mouth in the morning, 900 mg at 2 PM, and 1,800 mg at bedtime    [provider]  hydrOXYzine (VISTARIL) 25 MG capsule Take 50 mg by mouth at bedtime. 04/20/13   [provider]  levothyroxine (SYNTHROID, LEVOTHROID) 75 MCG tablet Take 75 mcg by mouth daily before breakfast.    [provider]  LORazepam (ATIVAN) 1 MG tablet Take 1 mg by mouth daily as needed for anxiety.     [provider]  meloxicam (MOBIC) 15 MG tablet Take 15 mg by mouth daily.     [provider]  methylphenidate (RITALIN) 10 MG tablet Take 10 mg by mouth 2 (two) times daily as needed (ADD or when studying).     [provider]  metoprolol succinate (TOPROL-XL) 50 MG 24 hr tablet Take 50 mg by mouth daily. Take with or immediately following a meal.    [provider]  morphine (MSIR) 15 MG tablet Take 15 mg by mouth at bedtime.    [provider]  omeprazole (PRILOSEC) 20 MG capsule Take 1 capsule (20 mg total) by mouth 2 (two) times daily before a meal. Patient taking differently: Take 20 mg by mouth in the morning and at bedtime. 01/14/19   Tiffany Kocher, PA-C  predniSONE (DELTASONE) 50 MG tablet Take 1 tablet (50 mg total) by mouth daily with breakfast. 04/21/21   Wallis Bamberg, PA-C  rOPINIRole (REQUIP) 3 MG tablet Take 3 mg by mouth at bedtime. 09/08/19   [provider]    Allergies    Amoxicillin, Ampicillin, Aspirin, Bee venom, Chlorzoxazone, Ciprofloxacin, Heparin, Indomethacin, Nsaids, Penicillins, Sulfa antibiotics, Albumin (human), Cephalexin, Chlorpromazine, Darvocet [propoxyphene n-acetaminophen], Dilaudid [hydromorphone hcl], Fentanyl, Hydrocodone, Ketorolac tromethamine, Latex, Nortriptyline hcl, Oxycodone, Pentazocine, Pentazocine-naloxone hcl,  Percocet [oxycodone-acetaminophen], Propoxyphene, Sertraline hcl, Tramadol hcl, Levothyroxine, Zithromax [azithromycin], Codeine, Hyoscyamine sulfate, Imipramine, Tape, and Tramadol  Review of Systems   Review of Systems  Constitutional:  Negative for appetite change.  HENT:  Negative for congestion, sore throat, trouble swallowing and voice change.   Respiratory:  Positive for shortness of breath.   Gastrointestinal:  Negative for abdominal distention.  Genitourinary:  Negative for flank pain.  Musculoskeletal:  Negative for back pain.  Neurological:  Negative for weakness.   Physical Exam Updated Vital Signs BP (!) 150/84    Pulse (!) 104    Temp 98.3 F (36.8 C) (Oral)    Resp 10    Ht  (1.575 m)    Wt 94 kg    SpO2 98%    BMI 37.90 kg/m   Physical Exam Vitals and nursing note reviewed.  HENT:     Head: Atraumatic.     Mouth/Throat:     Comments: Slight white discharge on tongue.  Posterior pharynx without white.  No edema.  Tongue appears normal size. Eyes:     Pupils: Pupils are equal, round, and reactive to light.  Pulmonary:     Breath sounds: No wheezing or rhonchi.     Comments: Occasional cough. Musculoskeletal:        General: No tenderness.     Cervical back: Neck supple.  Skin:    General: Skin is warm.     Capillary Refill: Capillary refill takes less than 2 seconds.  Neurological:     Mental Status: She is alert and oriented to person, place, and time.    ED Results / Procedures / Treatments   Labs (all labs ordered are listed, but only abnormal results are  displayed) Labs Reviewed  CBG MONITORING, ED - Abnormal; Notable for the following components:      Result Value   Glucose-Capillary 110 (*)    All other components within normal limits    EKG None  Radiology No results found.  Procedures Procedures   Medications Ordered in ED Medications  EPINEPHrine (EPI-PEN) 0.3 mg/0.3 mL injection (0 mg  Hold 05/02/21 2014)  nystatin (MYCOSTATIN)  100000 UNIT/ML suspension 500,000 Units (500,000 Units Oral Given 05/02/21 2115)    ED Course  I have reviewed the triage vital signs and the nursing notes.  Pertinent labs & imaging results that were available during my care of the patient were reviewed by me and considered in my medical decision making (see chart for details).    MDM Rules/Calculators/A&P                         Patient with tongue pain.  Felt if this was swelling.  Does have some white plaque on it.  Potentially could be a oral thrush since she has recently been on steroids.  We will treat with nystatin.  Glucose just mildly elevated.  Doubt acute diabetes.  Will have follow-up with PCP as an outpatient.  Doubt this was allergic reaction.    Final Clinical Impression(s) / ED Diagnoses Final diagnoses:  Thrush    Rx / DC Orders ED Discharge Orders          Ordered    nystatin (MYCOSTATIN) 100000 UNIT/ML suspension  4 times daily        05/02/21 2158             Benjiman Core, MD 05/03/21 [phone]

## 2021-10-22 ENCOUNTER — Ambulatory Visit
Admission: EM | Admit: 2021-10-22 | Discharge: 2021-10-22 | Disposition: A | Discharge status: Home / Self Care | Payer: Medicare Other | Attending: Family Medicine | Admitting: Family Medicine

## 2021-10-22 ENCOUNTER — Other Ambulatory Visit: Payer: Self-pay

## 2021-10-22 ENCOUNTER — Encounter: Payer: Self-pay | Admitting: Emergency Medicine

## 2021-10-22 DIAGNOSIS — R509 Fever, unspecified: Secondary | ICD-10-CM

## 2021-10-22 DIAGNOSIS — J029 Acute pharyngitis, unspecified: Secondary | ICD-10-CM

## 2021-10-22 LAB — POCT RAPID STREP A (OFFICE): Rapid Strep A Screen: NEGATIVE

## 2021-10-22 MED ORDER — AMOXICILLIN 875 MG PO TABS: 875.0000 mg | ORAL_TABLET | Freq: Two times a day (BID) | ORAL | 0 refills | Status: AC

## 2021-10-22 NOTE — ED Triage Notes (Signed)
Pt reports sore throat, headache, neck pain since last night. Intermittent fever.

## 2021-10-22 NOTE — Discharge Instructions (Signed)
You may use over the counter ibuprofen or acetaminophen as needed.  For a sore throat, over the counter products such as [employer] Peroxyl Mouth Sore Rinse or Chloraseptic Sore Throat Spray may provide some temporary relief. Your rapid strep test was negative today.

## 2021-10-24 NOTE — ED Provider Notes (Signed)
[name]   [id number] 10/22/21 Arrival Time: 1216  ASSESSMENT & PLAN:  1. Sore throat   2. Subjective fever     No signs of peritonsillar abscess. Discussed. Will tx empirically for strep based on exam. Begin: Meds ordered this encounter  Medications   amoxicillin (AMOXIL) 875 MG tablet    Sig: Take 1 tablet (875 mg total) by mouth 2 (two) times daily for 10 days.    Dispense:  20 tablet    Refill:  0    Results for orders placed or performed during the hospital encounter of 10/22/21  POCT rapid strep A  Result Value Ref Range   Rapid Strep A Screen Negative Negative   Labs Reviewed  POCT RAPID STREP A (OFFICE)    OTC analgesics and throat care as needed  Will follow up if not showing significant improvement over the next 24-48 hours.    Discharge Instructions      You may use over the counter ibuprofen or acetaminophen as needed.  For a sore throat, over the counter products such as [employer] Peroxyl Mouth Sore Rinse or Chloraseptic Sore Throat Spray may provide some temporary relief. Your rapid strep test was negative today.      Reviewed expectations re: course of current medical issues. Questions answered. Outlined signs and symptoms indicating need for more acute intervention. Patient verbalized understanding. After Visit Summary given.   SUBJECTIVE:  [name] is a 66 y.o. female who reports a sore throat. Onset abrupt beginning yesterday. Symptoms have gradually worsened since beginning; without voice changes. No respiratory symptoms. Normal PO intake but reports discomfort with swallowing. No specific alleviating factors. Fever: believed to be present, temp not taken. No neck pain or swelling. No associated nausea, vomiting, or abdominal pain. Known sick contacts: none. Recent travel: none. No tx PTA.  OBJECTIVE:  Vitals:   10/22/21 1403  BP: 123/78  Pulse: 96  Resp: 18  Temp: 99.7 F (37.6 C)  TempSrc: Oral  SpO2: 94%      General appearance: alert; no distress HEENT: throat with marked erythema and with exudative tonsillar hypertrophy; uvula is midline Neck: supple with FROM; moderate bilateral cervical LAD Lungs: speaks full sentences without difficulty; unlabored Abd: soft; non-tender Skin: reveals no rash; warm and dry Psychological: alert and cooperative; normal mood and affect  Allergies  Allergen Reactions   Amoxicillin Hives, Rash and Other (See Comments)    Stomach bleeding, also    Ampicillin Rash and Other (See Comments)    Stomach bleeding, also   Aspirin Other (See Comments)    Ulcers and GI bleeding. Patient states she can take the 81mg  aspirin, but not 325 MG-   Bee Venom Anaphylaxis   Chlorzoxazone Rash and Other (See Comments)    Caused pleural effusion/excessive fluid buildup   Ciprofloxacin Anaphylaxis, Swelling and Other (See Comments)    Throat swells shut   Heparin Other (See Comments)    HEART STOPS BEATING   Indomethacin Other (See Comments)    HEART STOPS BEATING   Nsaids Other (See Comments)    Stomach bleeding   Penicillins Hives, Shortness Of Breath, Swelling and Other (See Comments)    Has patient had a PCN reaction causing immediate rash, facial/tongue/throat swelling, SOB or lightheadedness with hypotension: Yes Has patient had a PCN reaction causing severe rash involving mucus membranes or skin necrosis: Yes Has patient had a PCN reaction that required hospitalization: Unknown Has patient had a PCN reaction occurring within the last 10  years: No If all of the above answers are "NO", then may proceed with Cephalosporin use.    Sulfa Antibiotics Other (See Comments)    Per patient, can not take sulfa antibiotics, patient unsure her reaction but knows she "cant have it"   Albumin (Human) Rash and Other (See Comments)    Unknown other reactions   Cephalexin Hives, Itching and Rash   Chlorpromazine Rash and Other (See Comments)    Hyperactivity, also   Darvocet  [Propoxyphene N-Acetaminophen] Itching, Nausea And Vomiting and Rash   Dilaudid [Hydromorphone Hcl] Itching, Nausea And Vomiting and Swelling   Fentanyl Hives and Nausea And Vomiting   Hydrocodone Nausea And Vomiting   Ketorolac Tromethamine Itching, Nausea And Vomiting and Rash   Latex Hives   Nortriptyline Hcl Itching, Rash and Other (See Comments)    Hyperactivity, also   Oxycodone Nausea And Vomiting   Pentazocine Nausea And Vomiting and Rash   Pentazocine-Naloxone Hcl Nausea And Vomiting   Percocet [Oxycodone-Acetaminophen] Itching, Nausea And Vomiting and Rash   Propoxyphene Itching, Nausea And Vomiting and Rash    Darvon   Sertraline Hcl Other (See Comments)    Hyperactive - bounces off the wall   Tramadol Hcl Nausea And Vomiting and Rash   Levothyroxine Nausea And Vomiting   Zithromax [Azithromycin]    Codeine Rash   Hyoscyamine Sulfate Other (See Comments)   Imipramine Itching and Rash   Tape Rash and Other (See Comments)    Must be hypo-allergenic tape!! No powdered gloves, either!!   Tramadol Nausea And Vomiting and Rash    Past Medical History:  Diagnosis Date   Allergic rhinitis    Anxiety    Arthritis    Asthma    Atypical chest pain    cath 2007 showing normal coronary arteries, last echo 2003 with normal EF and no systolic dysfunction   Broken ribs    Trauma falling off of horse November 2012   Chest pain    Chronic pain syndrome    CVA (cerebral infarction)    reported by patient, no documentation   Depression    history of prior suicide attempts   DVT (deep venous thrombosis) (HCC)    remote at age 78   GERD (gastroesophageal reflux disease)    Headache    Herniated nucleus pulposus, L4-5 left 2001   s/p Left microsurgical exploration L4-5 and microdiskectomy with lysis   Hypotension    history of hypotension in the past requiring fluid boluses   Hypothyroidism    Mitral valve prolapse    Mitral valve prolapse    Pneumonia 08/29/2011    Pseudoarthrosis of lumbar spine    Seizures (HCC)    in the 1990's; takes neurontin for this;no seizures since then.   Shortness of breath    Stroke (HCC)    hx of TIA   TIA (transient ischemic attack)    Social History   Socioeconomic History   Marital status: Divorced    Spouse name: Not on file   Number of children: 3   Years of education: Not on file   Highest education level: Not on file  Occupational History   Occupation: Disability  Tobacco Use   Smoking status: Never   Smokeless tobacco: Never  Vaping Use   Vaping Use: Never used  Substance and Sexual Activity   Alcohol use: No    Alcohol/week: 0.0 standard drinks of alcohol   Drug use: No   Sexual activity: Yes    Birth  control/protection: Post-menopausal, Surgical  Other Topics Concern   Not on file  Social History Narrative   Has 2 sons and 1 daughter   Lives with husband in [place]   Social Determinants of Health   Financial Resource Strain: Not on file  Food Insecurity: Not on file  Transportation Needs: Not on file  Physical Activity: Not on file  Stress: Not on file  Social Connections: Not on file  Intimate Partner Violence: Not on file   Family History  Problem Relation Age of Onset   Cancer Mother        Uterine   Cirrhosis Mother    Alcohol abuse Mother    Cancer Father        brain   Esophageal cancer Father    Alcohol abuse Father    Heart attack Brother        CABG   Hypertrophic cardiomyopathy Son    Cancer Maternal Aunt        cancer   Cancer Maternal Aunt        breast   Cancer Maternal Aunt        breast   Cancer Cousin        breast   Cancer Cousin        breast   Cancer Cousin        breast   Other Other        died due to perforated colon after colonoscopy           Mardella Layman, MD 10/24/21 1145

## 2021-12-13 ENCOUNTER — Other Ambulatory Visit (HOSPITAL_COMMUNITY): Payer: Self-pay | Admitting: Family Medicine

## 2021-12-13 ENCOUNTER — Other Ambulatory Visit: Payer: Self-pay | Admitting: Family Medicine

## 2021-12-13 DIAGNOSIS — R1031 Right lower quadrant pain: Secondary | ICD-10-CM

## 2021-12-27 ENCOUNTER — Encounter (HOSPITAL_COMMUNITY): Payer: Self-pay

## 2021-12-27 ENCOUNTER — Ambulatory Visit (HOSPITAL_COMMUNITY): Payer: Medicare Other

## 2023-03-27 ENCOUNTER — Other Ambulatory Visit: Payer: Self-pay | Admitting: Neurological Surgery

## 2023-03-27 DIAGNOSIS — M47816 Spondylosis without myelopathy or radiculopathy, lumbar region: Secondary | ICD-10-CM

## 2023-09-10 ENCOUNTER — Ambulatory Visit: Admitting: Family Medicine

## 2023-09-20 ENCOUNTER — Other Ambulatory Visit: Payer: Self-pay | Admitting: Family Medicine

## 2023-09-20 DIAGNOSIS — N644 Mastodynia: Secondary | ICD-10-CM

## 2023-09-21 ENCOUNTER — Ambulatory Visit
Admission: RE | Admit: 2023-09-21 | Discharge: 2023-09-21 | Disposition: A | Discharge status: Home / Self Care | Source: Ambulatory Visit | Attending: Family Medicine | Admitting: Family Medicine

## 2023-09-21 DIAGNOSIS — N644 Mastodynia: Secondary | ICD-10-CM

## 2024-03-31 ENCOUNTER — Other Ambulatory Visit: Payer: Self-pay | Admitting: Neurological Surgery

## 2024-03-31 DIAGNOSIS — M47816 Spondylosis without myelopathy or radiculopathy, lumbar region: Secondary | ICD-10-CM

## 2024-04-11 ENCOUNTER — Inpatient Hospital Stay: Admission: RE | Admit: 2024-04-11

## 2024-04-24 ENCOUNTER — Other Ambulatory Visit

## 2024-04-29 ENCOUNTER — Ambulatory Visit
Admission: RE | Admit: 2024-04-29 | Discharge: 2024-04-29 | Disposition: A | Discharge status: Home / Self Care | Source: Ambulatory Visit | Attending: Neurological Surgery | Admitting: Neurological Surgery

## 2024-04-29 DIAGNOSIS — M47816 Spondylosis without myelopathy or radiculopathy, lumbar region: Secondary | ICD-10-CM

## 2024-04-29 MED ORDER — GADOPICLENOL 0.5 MMOL/ML IV SOLN
9.0000 mL | Freq: Once | INTRAVENOUS | Status: AC | PRN
  Administered 2024-04-29: 9 mL via INTRAVENOUS

## 2024-06-17 ENCOUNTER — Ambulatory Visit: Admitting: Family Medicine
